# Patient Record
Sex: Female | Born: 1955 | Race: Black or African American | Hispanic: No | Marital: Single | Smoking: Never smoker
Health system: Southern US, Community
[De-identification: ages and names within clinical notes are randomized; demographics above are authoritative.]

## PROBLEM LIST (undated history)

## (undated) DIAGNOSIS — F329 Major depressive disorder, single episode, unspecified: Secondary | ICD-10-CM

## (undated) DIAGNOSIS — R7303 Prediabetes: Secondary | ICD-10-CM

## (undated) DIAGNOSIS — F259 Schizoaffective disorder, unspecified: Secondary | ICD-10-CM

## (undated) DIAGNOSIS — F32A Depression, unspecified: Secondary | ICD-10-CM

## (undated) DIAGNOSIS — I10 Essential (primary) hypertension: Secondary | ICD-10-CM

## (undated) DIAGNOSIS — E119 Type 2 diabetes mellitus without complications: Secondary | ICD-10-CM

## (undated) DIAGNOSIS — M419 Scoliosis, unspecified: Secondary | ICD-10-CM

## (undated) DIAGNOSIS — E7800 Pure hypercholesterolemia, unspecified: Secondary | ICD-10-CM

## (undated) HISTORY — PX: ABDOMINAL HYSTERECTOMY: SHX81

## (undated) NOTE — *Deleted (*Deleted)
   Name: Thuy Atilano MRN: [id number] DOB: 1955/12/21 29 y.o. PCP: System, Provider Not In  Date of Admission: 02/27/2020  3:43 PM Date of Discharge:  Attending Physician: Miguel Aschoff, MD  Discharge Diagnosis: 1. ***  Discharge Medications: Allergies as of 03/02/2020      Reactions   Paliperidone    Other reaction(s): Hives / Skin Rash   Quetiapine Fumarate Rash   Other reaction(s): Hives / Skin Rash   Risperidone Rash   Other reaction(s): Hives / Skin Rash   Diphenhydramine Hcl    Other reaction(s): Difficulty breathing, Hives / Skin Rash   Latex Rash   Other reaction(s): Rash, Rash Other reaction(s): DERMATITIS Other reaction(s): DERMATITIS    Med Rec must be completed prior to using this [facility]***       Disposition and follow-up:   Ms.Adley Ballester was discharged from [facility] in {DISCHARGE CONDITION:19696} condition.  At the hospital follow up visit please address:  1.  ***  2.  Labs / imaging needed at time of follow-up: ***  3.  Pending labs/ test needing follow-up: ***  Follow-up Appointments:   Hospital Course by problem list: 1. ***  Discharge Vitals:   BP 125/69 (BP Location: Right Arm)   Pulse (!) 55   Temp 98 F (36.7 C) (Oral)   Resp 18   Wt 68.3 kg   SpO2 100%   BMI 27.54 kg/m   Pertinent Labs, Studies, and Procedures:  ***  Discharge Instructions:   SignedEliezer Bottom, MD 03/02/2020, 3:29 PM   Pager: @MYPAGER @

---

## 2012-07-31 ENCOUNTER — Emergency Department (HOSPITAL_BASED_OUTPATIENT_CLINIC_OR_DEPARTMENT_OTHER)
Admission: EM | Admit: 2012-07-31 | Discharge: 2012-07-31 | Disposition: A | Discharge status: Home / Self Care | Payer: Medicare Other | Attending: Emergency Medicine | Admitting: Emergency Medicine

## 2012-07-31 ENCOUNTER — Emergency Department (HOSPITAL_BASED_OUTPATIENT_CLINIC_OR_DEPARTMENT_OTHER): Payer: Medicare Other

## 2012-07-31 ENCOUNTER — Encounter (HOSPITAL_BASED_OUTPATIENT_CLINIC_OR_DEPARTMENT_OTHER): Payer: Self-pay | Admitting: *Deleted

## 2012-07-31 DIAGNOSIS — M25569 Pain in unspecified knee: Secondary | ICD-10-CM | POA: Insufficient documentation

## 2012-07-31 DIAGNOSIS — I10 Essential (primary) hypertension: Secondary | ICD-10-CM | POA: Insufficient documentation

## 2012-07-31 DIAGNOSIS — Z862 Personal history of diseases of the blood and blood-forming organs and certain disorders involving the immune mechanism: Secondary | ICD-10-CM | POA: Insufficient documentation

## 2012-07-31 DIAGNOSIS — Z79899 Other long term (current) drug therapy: Secondary | ICD-10-CM | POA: Insufficient documentation

## 2012-07-31 DIAGNOSIS — F3289 Other specified depressive episodes: Secondary | ICD-10-CM | POA: Insufficient documentation

## 2012-07-31 DIAGNOSIS — F329 Major depressive disorder, single episode, unspecified: Secondary | ICD-10-CM | POA: Insufficient documentation

## 2012-07-31 DIAGNOSIS — M5432 Sciatica, left side: Secondary | ICD-10-CM

## 2012-07-31 DIAGNOSIS — M545 Low back pain, unspecified: Secondary | ICD-10-CM | POA: Insufficient documentation

## 2012-07-31 DIAGNOSIS — M5430 Sciatica, unspecified side: Secondary | ICD-10-CM | POA: Insufficient documentation

## 2012-07-31 DIAGNOSIS — Z8639 Personal history of other endocrine, nutritional and metabolic disease: Secondary | ICD-10-CM | POA: Insufficient documentation

## 2012-07-31 HISTORY — DX: Major depressive disorder, single episode, unspecified: F32.9

## 2012-07-31 HISTORY — DX: Pure hypercholesterolemia, unspecified: E78.00

## 2012-07-31 HISTORY — DX: Depression, unspecified: F32.A

## 2012-07-31 HISTORY — DX: Essential (primary) hypertension: I10

## 2012-07-31 MED ORDER — CYCLOBENZAPRINE HCL 10 MG PO TABS: 10.0000 mg | ORAL_TABLET | Freq: Two times a day (BID) | ORAL | Status: DC | PRN

## 2012-07-31 MED ORDER — HYDROCODONE-ACETAMINOPHEN 5-325 MG PO TABS: 1.0000 | ORAL_TABLET | Freq: Once | ORAL | Status: DC

## 2012-07-31 MED ORDER — HYDROCODONE-ACETAMINOPHEN 5-325 MG PO TABS: 1.0000 | ORAL_TABLET | Freq: Four times a day (QID) | ORAL | Status: DC | PRN

## 2012-07-31 NOTE — ED Notes (Signed)
Patient c/o L hip/knee pain over that past two months that has grown worse, took advil, temperary relief

## 2012-07-31 NOTE — ED Provider Notes (Signed)
History     CSN: [id number]  Arrival date & time 07/31/12  7829   First MD Initiated Contact with Patient 07/31/12 [phone]      Chief Complaint  Patient presents with  . Hip Pain  . Knee Pain    (Consider location/radiation/quality/duration/timing/severity/associated sxs/prior treatment) Patient is a 57 y.o. female presenting with hip pain and knee pain. The history is provided by the patient.  Hip Pain Pertinent negatives include no chest pain, no headaches and no shortness of breath.  Knee Pain Associated symptoms: back pain   Associated symptoms: no fever and no neck pain    patient with complaint of the left hip pain radiating to the knee anterior lateral part of 5. Ongoing for the past 2 months has gotten worse lately. Denies any numbness or weakness to the left foot no incontinence problems. Patient has had trouble with her lower back in the past and also has left back pain. Patient has a followup appointment with Dr. Christell Constant of orthopedics in [facility] on May 1. Patient treating herself with NSAIDs at home. There has been no injury to her back or leg. Pain is 10 out of 10 radiating as noted above was as sharp and an ache. Pain is worse at night. Not made worse by anything.  Past Medical History  Diagnosis Date  . Hypertension   . High cholesterol   . Depression     Past Surgical History  Procedure Laterality Date  . Abdominal hysterectomy      No family history on file.  History  Substance Use Topics  . Smoking status: Never Smoker   . Smokeless tobacco: Not on file  . Alcohol Use: No    OB History   Grav Para Term Preterm Abortions TAB SAB Ect Mult Living                  Review of Systems  Constitutional: Negative for fever.  HENT: Negative for neck pain.   Eyes: Negative for visual disturbance.  Respiratory: Negative for shortness of breath.   Cardiovascular: Negative for chest pain.  Genitourinary: Negative for dysuria.  Musculoskeletal: Positive for  back pain.  Skin: Negative for rash.  Neurological: Negative for weakness, numbness and headaches.  Hematological: Does not bruise/bleed easily.  Psychiatric/Behavioral: Negative for confusion.    Allergies  Review of patient's allergies indicates not on file.  Home Medications   Current Outpatient Rx  Name  Route  Sig  Dispense  Refill  . amoxicillin (AMOXIL) 500 MG capsule   Oral   Take 500 mg by mouth 3 (three) times daily.         . ARIPiprazole (ABILIFY) 15 MG tablet   Oral   Take 15 mg by mouth daily.         Marland Kitchen estradiol (ESTRACE) 1 MG tablet   Oral   Take 1 mg by mouth daily.         Marland Kitchen FLUoxetine (PROZAC) 20 MG tablet   Oral   Take 50 mg by mouth daily.         . trazodone (DESYREL) 300 MG tablet   Oral   Take 300 mg by mouth at bedtime.         . triamterene-hydrochlorothiazide (DYAZIDE) 37.5-25 MG per capsule   Oral   Take 1 capsule by mouth every morning.         . cyclobenzaprine (FLEXERIL) 10 MG tablet   Oral   Take 1 tablet (10 mg  total) by mouth 2 (two) times daily as needed for muscle spasms.   20 tablet   0   . HYDROcodone-acetaminophen (NORCO/VICODIN) 5-325 MG per tablet   Oral   Take 1-2 tablets by mouth every 6 (six) hours as needed for pain.   20 tablet   0     BP 168/91  Pulse 91  Temp(Src) 98.1 F (36.7 C)  Resp 18  SpO2 98%  Physical Exam  Nursing note and vitals reviewed. Constitutional: She is oriented to person, place, and time. She appears well-developed and well-nourished.  HENT:  Head: Normocephalic and atraumatic.  Eyes: Conjunctivae and EOM are normal. Pupils are equal, round, and reactive to light.  Neck: Normal range of motion. Neck supple.  Cardiovascular: Normal rate and regular rhythm.   Pulmonary/Chest: Effort normal and breath sounds normal.  Abdominal: Soft. Bowel sounds are normal. There is no tenderness.  Musculoskeletal: Normal range of motion. She exhibits tenderness. She exhibits no edema.   Mild tenderness lower lumbar area.  Neurological: She is alert and oriented to person, place, and time. No cranial nerve deficit. She exhibits normal muscle tone. Coordination normal.  Skin: Skin is warm. No rash noted.    ED Course  Procedures (including critical care time)  Labs Reviewed - No data to display Dg Hip Bilateral W/pelvis  07/31/2012  *RADIOLOGY REPORT*  Clinical Data: Bilateral hip pain.  BILATERAL HIP WITH PELVIS - 4+ VIEW  Comparison: None  Findings: There is no evidence of fracture, subluxation or dislocation. The joint spaces are unremarkable. No focal bony lesions are identified. No significant abnormalities are identified.  IMPRESSION: Unremarkable exam.   Original Report Authenticated By: Harmon Pier, M.D.    Ct Lumbar Spine Wo Contrast  07/31/2012  *RADIOLOGY REPORT*  Clinical Data: Back pain with left hip pain  CT LUMBAR SPINE WITHOUT CONTRAST  Technique:  Multidetector CT imaging of the lumbar spine was performed without intravenous contrast administration. Multiplanar CT image reconstructions were also generated.  Comparison: None.  Findings: Moderate levoscoliosis at L3-4.  Normal sagittal alignment.  Negative for fracture or mass lesion.  L1-2:  Mild facet degeneration.  Negative for disc protrusion or stenosis  L2-3:  Mild facet degeneration on the left.  Negative for disc protrusion  L3-4:  Mild facet degeneration without disc protrusion  L4-5:  Advanced facet degeneration bilaterally.  Mild disc bulging. Mild spinal stenosis.  Negative for focal disc protrusion.  L5-S1:  Mild disc degeneration and facet degeneration without stenosis.  IMPRESSION: Lumbar scoliosis.  Negative for fracture.  Lumbar facet degeneration, most severe at L4-5 where there is mild spinal stenosis.  No acute disc protrusion is identified.   Original Report Authenticated By: Janeece Riggers, M.D.      1. Lumbar pain   2. Sciatica neuralgia, left       MDM  The patient's symptoms are most  likely lumbar back related systems what sciatica type complaint of recent to the left side and based on the CT of the lumbar area a lot of facet degeneration in that area may be causing the symptoms no focal neuro deficit to her feet on the left side. No incontinence. Patient retreated with pain medicine does have an appointment with orthopedics Dr. Christell Constant in [facility] on May 1. Patient to followup with them.        Shelda Jakes, MD 07/31/12 [phone]

## 2012-12-29 ENCOUNTER — Encounter (HOSPITAL_BASED_OUTPATIENT_CLINIC_OR_DEPARTMENT_OTHER): Payer: Self-pay

## 2012-12-29 ENCOUNTER — Emergency Department (HOSPITAL_BASED_OUTPATIENT_CLINIC_OR_DEPARTMENT_OTHER): Payer: Medicare Other

## 2012-12-29 ENCOUNTER — Emergency Department (HOSPITAL_BASED_OUTPATIENT_CLINIC_OR_DEPARTMENT_OTHER)
Admission: EM | Admit: 2012-12-29 | Discharge: 2012-12-29 | Disposition: A | Discharge status: Home / Self Care | Payer: Medicare Other | Attending: Emergency Medicine | Admitting: Emergency Medicine

## 2012-12-29 DIAGNOSIS — R296 Repeated falls: Secondary | ICD-10-CM | POA: Insufficient documentation

## 2012-12-29 DIAGNOSIS — Y92009 Unspecified place in unspecified non-institutional (private) residence as the place of occurrence of the external cause: Secondary | ICD-10-CM | POA: Insufficient documentation

## 2012-12-29 DIAGNOSIS — F329 Major depressive disorder, single episode, unspecified: Secondary | ICD-10-CM | POA: Insufficient documentation

## 2012-12-29 DIAGNOSIS — I10 Essential (primary) hypertension: Secondary | ICD-10-CM | POA: Insufficient documentation

## 2012-12-29 DIAGNOSIS — Y939 Activity, unspecified: Secondary | ICD-10-CM | POA: Insufficient documentation

## 2012-12-29 DIAGNOSIS — G8929 Other chronic pain: Secondary | ICD-10-CM | POA: Insufficient documentation

## 2012-12-29 DIAGNOSIS — Z8639 Personal history of other endocrine, nutritional and metabolic disease: Secondary | ICD-10-CM | POA: Insufficient documentation

## 2012-12-29 DIAGNOSIS — M25562 Pain in left knee: Secondary | ICD-10-CM

## 2012-12-29 DIAGNOSIS — Z862 Personal history of diseases of the blood and blood-forming organs and certain disorders involving the immune mechanism: Secondary | ICD-10-CM | POA: Insufficient documentation

## 2012-12-29 DIAGNOSIS — M25552 Pain in left hip: Secondary | ICD-10-CM

## 2012-12-29 DIAGNOSIS — W19XXXA Unspecified fall, initial encounter: Secondary | ICD-10-CM

## 2012-12-29 DIAGNOSIS — F3289 Other specified depressive episodes: Secondary | ICD-10-CM | POA: Insufficient documentation

## 2012-12-29 DIAGNOSIS — S79919A Unspecified injury of unspecified hip, initial encounter: Secondary | ICD-10-CM | POA: Insufficient documentation

## 2012-12-29 DIAGNOSIS — Z8739 Personal history of other diseases of the musculoskeletal system and connective tissue: Secondary | ICD-10-CM | POA: Insufficient documentation

## 2012-12-29 DIAGNOSIS — S8990XA Unspecified injury of unspecified lower leg, initial encounter: Secondary | ICD-10-CM | POA: Insufficient documentation

## 2012-12-29 DIAGNOSIS — F259 Schizoaffective disorder, unspecified: Secondary | ICD-10-CM | POA: Insufficient documentation

## 2012-12-29 DIAGNOSIS — Z79899 Other long term (current) drug therapy: Secondary | ICD-10-CM | POA: Insufficient documentation

## 2012-12-29 HISTORY — DX: Scoliosis, unspecified: M41.9

## 2012-12-29 HISTORY — DX: Prediabetes: R73.03

## 2012-12-29 HISTORY — DX: Schizoaffective disorder, unspecified: F25.9

## 2012-12-29 MED ORDER — TRAMADOL HCL 50 MG PO TABS: 50.0000 mg | ORAL_TABLET | Freq: Three times a day (TID) | ORAL | Status: DC | PRN

## 2012-12-29 NOTE — ED Notes (Addendum)
Pt reports she fell last night and now has increased left hip pain radiating to left knee.  She has chronic back  pain and seen by neuro.  Pt also reports she has been "dropping things" x 2 days.

## 2012-12-29 NOTE — ED Provider Notes (Signed)
CSN: [id number]     Arrival date & time 12/29/12  9562 History   First MD Initiated Contact with Patient 12/29/12 0901     Chief Complaint  Patient presents with  . Hip Pain  . Knee Pain   (Consider location/radiation/quality/duration/timing/severity/associated sxs/prior Treatment) HPI Comments: Fall last night at home. L leg gave out, which is a chronic problem. No preceding symptoms. No N/V/dizziness. Patient here with L knee and hip pain.   Patient is a 57 y.o. female presenting with hip pain and knee pain. The history is provided by the patient.  Hip Pain This is a recurrent problem. The current episode started 6 to 12 hours ago. The problem occurs constantly. The problem has been gradually worsening. Pertinent negatives include no chest pain, no abdominal pain, no headaches and no shortness of breath. Nothing aggravates the symptoms. Nothing relieves the symptoms.  Knee Pain Associated symptoms: no fever     Past Medical History  Diagnosis Date  . Hypertension   . High cholesterol   . Depression   . Borderline diabetes   . Schizoaffective disorder   . Scoliosis    Past Surgical History  Procedure Laterality Date  . Abdominal hysterectomy     No family history on file. History  Substance Use Topics  . Smoking status: Never Smoker   . Smokeless tobacco: Not on file  . Alcohol Use: No   OB History   Grav Para Term Preterm Abortions TAB SAB Ect Mult Living                 Review of Systems  Constitutional: Negative for fever.  Respiratory: Negative for cough and shortness of breath.   Cardiovascular: Negative for chest pain.  Gastrointestinal: Negative for abdominal pain.  Neurological: Negative for headaches.  All other systems reviewed and are negative.    Allergies  Review of patient's allergies indicates no known allergies.  Home Medications   Current Outpatient Rx  Name  Route  Sig  Dispense  Refill  . ARIPiprazole (ABILIFY) 15 MG tablet   Oral  Take 15 mg by mouth daily.         Marland Kitchen estradiol (ESTRACE) 1 MG tablet   Oral   Take 1 mg by mouth daily.         Marland Kitchen FLUoxetine (PROZAC) 20 MG tablet   Oral   Take 50 mg by mouth daily.         . trazodone (DESYREL) 300 MG tablet   Oral   Take 300 mg by mouth at bedtime.         . triamterene-hydrochlorothiazide (DYAZIDE) 37.5-25 MG per capsule   Oral   Take 1 capsule by mouth every morning.          BP 116/110  Pulse 108  Temp(Src) 98.9 F (37.2 C) (Oral)  Resp 16  Wt 150 lb (68.04 kg)  SpO2 97% Physical Exam  Nursing note and vitals reviewed. Constitutional: She is oriented to person, place, and time. She appears well-developed and well-nourished. No distress.  HENT:  Head: Normocephalic and atraumatic.  Eyes: EOM are normal. Pupils are equal, round, and reactive to light.  Neck: Normal range of motion. Neck supple.  Cardiovascular: Normal rate and regular rhythm.  Exam reveals no friction rub.   No murmur heard. Pulmonary/Chest: Effort normal and breath sounds normal. No respiratory distress. She has no wheezes. She has no rales.  Abdominal: Soft. She exhibits no distension. There is no tenderness. There is  no rebound.  Musculoskeletal: Normal range of motion. She exhibits no edema.       Left hip: She exhibits tenderness (buttock). She exhibits normal range of motion, normal strength and no bony tenderness.       Left knee: She exhibits normal range of motion. Tenderness (patella) found.  Neurological: She is alert and oriented to person, place, and time.  Skin: She is not diaphoretic.    ED Course  Procedures (including critical care time) Labs Review Labs Reviewed - No data to display Imaging Review Dg Hip Complete Left  12/29/2012   CLINICAL DATA:  Left hip pain posteriorly radiating to left knee, fell last night  EXAM: LEFT HIP - COMPLETE 2+ VIEW  COMPARISON:  07/31/2012  FINDINGS: Symmetric hip and SI joints.  Osseous mineralization normal.  Pelvis  intact.  No acute fracture, dislocation, or bone destruction.  IMPRESSION: No acute osseous abnormalities.   Electronically Signed   By: Ulyses Southward M.D.   On: 12/29/2012 10:26   Dg Knee Complete 4 Views Left  12/29/2012   CLINICAL DATA:  Left hip pain posteriorly radiating to left knee laterally, fell last night  EXAM: LEFT KNEE - COMPLETE 4+ VIEW  COMPARISON:  None  FINDINGS: Bone mineralization normal. Joint spaces preserved. No fracture, dislocation, or bone destruction. No joint effusion.  IMPRESSION: Normal exam.   Electronically Signed   By: Ulyses Southward M.D.   On: 12/29/2012 10:28    MDM   1. Fall, initial encounter   2. Knee pain, acute, left   3. Hip pain, acute, left    57 year old female with history of scoliosis and chronic back pain presents with left knee pain and left buttock pain after a fall. She said her leg gave out while at home last night. No other preceding symptoms like chest pain or shortness of breath. She states this problem has been having a lot over the past 4-5 months. She is followed by her doctor for her back pain and takes tramadol. She reports left knee pain and left buttock pain today. On exam her vitals are stable. L buttock pain with very mild bruising. No L hip pain. L knee pain at patella, no bruising. Full ROM of knee and hip. Will xray knee and hip.  Xrays normal. Stable for discharge. Given tramadol.  Dagmar Hait, MD 12/29/12 [phone]

## 2012-12-29 NOTE — ED Notes (Signed)
Patient transported to X-ray 

## 2015-11-15 ENCOUNTER — Encounter (HOSPITAL_BASED_OUTPATIENT_CLINIC_OR_DEPARTMENT_OTHER): Payer: Self-pay | Admitting: *Deleted

## 2015-11-15 ENCOUNTER — Emergency Department (HOSPITAL_BASED_OUTPATIENT_CLINIC_OR_DEPARTMENT_OTHER)
Admission: EM | Admit: 2015-11-15 | Discharge: 2015-11-15 | Disposition: A | Discharge status: Home / Self Care | Payer: Medicare Other | Attending: Emergency Medicine | Admitting: Emergency Medicine

## 2015-11-15 DIAGNOSIS — Z79899 Other long term (current) drug therapy: Secondary | ICD-10-CM | POA: Insufficient documentation

## 2015-11-15 DIAGNOSIS — M722 Plantar fascial fibromatosis: Secondary | ICD-10-CM | POA: Insufficient documentation

## 2015-11-15 DIAGNOSIS — F329 Major depressive disorder, single episode, unspecified: Secondary | ICD-10-CM | POA: Insufficient documentation

## 2015-11-15 DIAGNOSIS — M25572 Pain in left ankle and joints of left foot: Secondary | ICD-10-CM | POA: Diagnosis present

## 2015-11-15 DIAGNOSIS — Z7984 Long term (current) use of oral hypoglycemic drugs: Secondary | ICD-10-CM | POA: Diagnosis not present

## 2015-11-15 DIAGNOSIS — E119 Type 2 diabetes mellitus without complications: Secondary | ICD-10-CM | POA: Insufficient documentation

## 2015-11-15 DIAGNOSIS — I10 Essential (primary) hypertension: Secondary | ICD-10-CM | POA: Diagnosis not present

## 2015-11-15 HISTORY — DX: Type 2 diabetes mellitus without complications: E11.9

## 2015-11-15 NOTE — ED Triage Notes (Signed)
Pain in her feet. States she is diabetic and has a hx of neuropathy.

## 2015-11-15 NOTE — Discharge Instructions (Signed)
Please read and follow all provided instructions.  Your diagnoses today include:  1. Plantar fasciitis    Tests performed today include: Vital signs. See below for your results today.   Medications prescribed:  Take as prescribed   Home care instructions:  Follow any educational materials contained in this packet.  Follow-up instructions: Please follow-up with Orthopedics for further evaluation of symptoms and treatment   Return instructions:  Please return to the Emergency Department if you do not get better, if you get worse, or new symptoms OR  - Fever (temperature greater than 101.81F)  - Bleeding that does not stop with holding pressure to the area    -Severe pain (please note that you may be more sore the day after your accident)  - Chest Pain  - Difficulty breathing  - Severe nausea or vomiting  - Inability to tolerate food and liquids  - Passing out  - Skin becoming red around your wounds  - Change in mental status (confusion or lethargy)  - New numbness or weakness    Please return if you have any other emergent concerns.  Additional Information:  Your vital signs today were: There were no vitals taken for this visit. If your blood pressure (BP) was elevated above 135/85 this visit, please have this repeated by your doctor within one month. ---------------

## 2015-11-15 NOTE — ED Provider Notes (Signed)
[facility] Provider Note   CSN: [id number] Arrival date & time: 11/15/15  1404  By signing my name below, I, Alyssa Grove, attest that this documentation has been prepared under the direction and in the presence of Audry Pili, PA-C. Electronically Signed: Alyssa Grove, ED Scribe. 11/15/15. 3:59 PM.  First MD Initiated Contact with Patient 11/15/15 1558    History   Chief Complaint Chief Complaint  Patient presents with  . Foot Pain    The history is provided by the patient and a friend. No language interpreter was used.    HPI Comments: Jessica Campbell is a 60 y.o. female with PMHx of DM who presents to the Emergency Department complaining of gradual onset and worsening, constant 8/10 bilateral foot pain onset 3 weeks. Pt states she has been having the sensation of pins and needles in the soles of her feet. Pt states pain is exacerbated with walking. Pt denies injury to feet. She states she wears special insoles which originally helped relieve pain, but now do not relieve pain. Pt has tried rolling her foot over a cold water bottle with no relief to pain. Pt states her blood sugar was measured at 129 today at home and are normally not under control. Pt uses Insulin at night and Metformin. Pt was advised to use a sliding scale Insulin by her PCP. Friend reports decreased appetite. No other symptoms noted   Past Medical History:  Diagnosis Date  . Borderline diabetes   . Depression   . Diabetes mellitus without complication (HCC)   . High cholesterol   . Hypertension   . Schizoaffective disorder (HCC)   . Scoliosis     There are no active problems to display for this patient.   Past Surgical History:  Procedure Laterality Date  . ABDOMINAL HYSTERECTOMY      OB History    No data available       Home Medications    Prior to Admission medications   Medication Sig Start Date End Date Taking? Authorizing Provider  atorvastatin (LIPITOR) 40 MG tablet Take 40  mg by mouth daily.   Yes Historical Provider, MD  ezetimibe (ZETIA) 10 MG tablet Take 10 mg by mouth daily.   Yes Historical Provider, MD  fluPHENAZine (PROLIXIN) 1 MG tablet Take 1 mg by mouth daily.   Yes Historical Provider, MD  gabapentin (NEURONTIN) 300 MG capsule Take 300 mg by mouth 3 (three) times daily.   Yes Historical Provider, MD  glipiZIDE (GLUCOTROL XL) 10 MG 24 hr tablet Take 10 mg by mouth daily with breakfast.   Yes Historical Provider, MD  glipizide-metformin (METAGLIP) 2.5-250 MG tablet Take 1 tablet by mouth 2 (two) times daily before a meal.   Yes Historical Provider, MD  metFORMIN (GLUCOPHAGE) 1000 MG tablet Take 1,000 mg by mouth 2 (two) times daily with a meal.   Yes Historical Provider, MD  sertraline (ZOLOFT) 50 MG tablet Take 50 mg by mouth daily.   Yes Historical Provider, MD  traMADol (ULTRAM) 50 MG tablet Take 1 tablet (50 mg total) by mouth every 8 (eight) hours as needed for pain. 12/29/12  Yes Elwin Mocha, MD  trazodone (DESYREL) 300 MG tablet Take 300 mg by mouth at bedtime.   Yes Historical Provider, MD  ARIPiprazole (ABILIFY) 15 MG tablet Take 15 mg by mouth daily.    Historical Provider, MD  estradiol (ESTRACE) 1 MG tablet Take 1 mg by mouth daily.    Historical Provider, MD  FLUoxetine (  PROZAC) 20 MG tablet Take 50 mg by mouth daily.    Historical Provider, MD  triamterene-hydrochlorothiazide (DYAZIDE) 37.5-25 MG per capsule Take 1 capsule by mouth every morning.    Historical Provider, MD    Family History No family history on file.  Social History Social History  Substance Use Topics  . Smoking status: Never Smoker  . Smokeless tobacco: Never Used  . Alcohol use No    Allergies   Review of patient's allergies indicates no known allergies.   Review of Systems Review of Systems  Constitutional: Negative for appetite change and fever.  Musculoskeletal: Positive for arthralgias.  All other systems reviewed and are negative.  Physical  Exam Updated Vital Signs There were no vitals taken for this visit.  Physical Exam  Constitutional: She appears well-developed and well-nourished.  HENT:  Head: Normocephalic.  Eyes: Conjunctivae are normal.  Cardiovascular: Normal rate.   Pulmonary/Chest: Effort normal. No respiratory distress.  Abdominal: She exhibits no distension.  Musculoskeletal: Normal range of motion.  Bilateral feet neurovascularly intact. Normal ROM. Distal pulses intact. TTP along proximal arch underneath feet. No erythema. No induration.   Neurological: She is alert.  Skin: Skin is warm and dry.  Psychiatric: She has a normal mood and affect. Her behavior is normal.  Nursing note and vitals reviewed.   ED Treatments / Results    COORDINATION OF CARE: 4:05 PM Discussed treatment plan with pt at bedside which includes referral to Orthopedist and pt agreed to plan.  Labs (all labs ordered are listed, but only abnormal results are displayed) Labs Reviewed - No data to display  EKG  EKG Interpretation None      Radiology No results found.  Procedures Procedures (including critical care time)  Medications Ordered in ED Medications - No data to display   Initial Impression / Assessment and Plan / ED Course  I have reviewed the triage vital signs and the nursing notes.  Pertinent labs & imaging results that were available during my care of the patient were reviewed by me and considered in my medical decision making (see chart for details).  Clinical Course   Final Clinical Impressions(s) / ED Diagnoses  I have reviewed the relevant previous healthcare records. I obtained HPI from historian.  ED Course:  Assessment: Pt is a 60yF with hx DM who presents with bilateral foot pain x 3 weeks. On exam, pt in NAD. Nontoxic/nonseptic appearing. VSS. Afebrile.Bilateral feet TTP below proximal arch of feet. No erythema no signs of infection. Neurovascularly intact. Likely plantar fascitis. Counseled  on stretches and follow up to orthopedics for further evaluation. Counseled to take NSAIDs for inflammation. Plan is to DC home with follow up to Ortho. At time of discharge, Patient is in no acute distress. Vital Signs are stable. Patient is able to ambulate. Patient able to tolerate PO.    Disposition/Plan:  DC Home Additional Verbal discharge instructions given and discussed with patient.  Pt Instructed to f/u with Ortho in the next week for evaluation and treatment of symptoms. Return precautions given Pt acknowledges and agrees with plan  Supervising Physician Nelva Nayobert Beaton, MD   Final diagnoses:  Plantar fasciitis    New Prescriptions New Prescriptions   No medications on file   I personally performed the services described in this documentation, which was scribed in my presence. The recorded information has been reviewed and is accurate.     Audry Piliyler Jovany Disano, PA-C 11/15/15 1622    Nelva Nayobert Beaton, MD 11/16/15 [phone]

## 2015-11-20 ENCOUNTER — Ambulatory Visit: Payer: Medicare Other | Admitting: Family Medicine

## 2018-02-09 ENCOUNTER — Other Ambulatory Visit: Payer: Self-pay

## 2018-02-09 ENCOUNTER — Encounter (HOSPITAL_BASED_OUTPATIENT_CLINIC_OR_DEPARTMENT_OTHER): Payer: Self-pay | Admitting: *Deleted

## 2018-02-09 ENCOUNTER — Emergency Department (HOSPITAL_BASED_OUTPATIENT_CLINIC_OR_DEPARTMENT_OTHER)
Admission: EM | Admit: 2018-02-09 | Discharge: 2018-02-09 | Disposition: A | Discharge status: Home / Self Care | Payer: Medicare HMO | Attending: Emergency Medicine | Admitting: Emergency Medicine

## 2018-02-09 DIAGNOSIS — I10 Essential (primary) hypertension: Secondary | ICD-10-CM | POA: Diagnosis not present

## 2018-02-09 DIAGNOSIS — E119 Type 2 diabetes mellitus without complications: Secondary | ICD-10-CM | POA: Diagnosis not present

## 2018-02-09 DIAGNOSIS — R51 Headache: Secondary | ICD-10-CM | POA: Insufficient documentation

## 2018-02-09 DIAGNOSIS — R519 Headache, unspecified: Secondary | ICD-10-CM

## 2018-02-09 DIAGNOSIS — Z7984 Long term (current) use of oral hypoglycemic drugs: Secondary | ICD-10-CM | POA: Diagnosis not present

## 2018-02-09 DIAGNOSIS — Z79899 Other long term (current) drug therapy: Secondary | ICD-10-CM | POA: Diagnosis not present

## 2018-02-09 MED ORDER — KETOROLAC TROMETHAMINE 15 MG/ML IJ SOLN
15.0000 mg | Freq: Once | INTRAMUSCULAR | Status: AC
  Administered 2018-02-09: 15 mg via INTRAVENOUS
  Filled 2018-02-09: qty 1

## 2018-02-09 MED ORDER — DIPHENHYDRAMINE HCL 50 MG/ML IJ SOLN
25.0000 mg | Freq: Once | INTRAMUSCULAR | Status: AC
  Administered 2018-02-09: 25 mg via INTRAVENOUS
  Filled 2018-02-09: qty 1

## 2018-02-09 MED ORDER — METOCLOPRAMIDE HCL 5 MG/ML IJ SOLN
10.0000 mg | INTRAMUSCULAR | Status: AC
  Administered 2018-02-09: 10 mg via INTRAVENOUS
  Filled 2018-02-09: qty 2

## 2018-02-09 MED ORDER — SODIUM CHLORIDE 0.9 % IV BOLUS
1000.0000 mL | Freq: Once | INTRAVENOUS | Status: AC
  Administered 2018-02-09: 1000 mL via INTRAVENOUS

## 2018-02-09 NOTE — ED Notes (Signed)
Blood glucose 247.

## 2018-02-09 NOTE — ED Notes (Signed)
ED Provider at bedside. 

## 2018-02-09 NOTE — ED Provider Notes (Signed)
[facility] EMERGENCY DEPARTMENT Provider Note   CSN: [id number] Arrival date & time: 02/09/18  1644     History   Chief Complaint Chief Complaint  Patient presents with  . Headache    HPI Jessica Campbell is a 62 y.o. female.  62 year old female with past medical history below including hypertension, hyperlipidemia, type 2 diabetes mellitus, depression, schizoaffective disorder who presents with headache.Yesterday she had a gradual onset of yesterday she had a gradual onset of dull, intermittent headache that has been persistent today.  Today she has had associated nausea and generalized weakness but no associated vomiting, diarrhea, fevers, vision changes, extremity weakness, or numbness.  She states that in the past she has had a similar headache that she had to be treated for in the ER.  No recent changes to her medications. No recent illness.  The history is provided by the patient.  Headache      Past Medical History:  Diagnosis Date  . Borderline diabetes   . Depression   . Diabetes mellitus without complication (HCC)   . High cholesterol   . Hypertension   . Schizoaffective disorder (HCC)   . Scoliosis     There are no active problems to display for this patient.   Past Surgical History:  Procedure Laterality Date  . ABDOMINAL HYSTERECTOMY       OB History   None      Home Medications    Prior to Admission medications   Medication Sig Start Date End Date Taking? Authorizing Provider  ARIPiprazole (ABILIFY) 15 MG tablet Take 15 mg by mouth daily.    [provider]  atorvastatin (LIPITOR) 40 MG tablet Take 40 mg by mouth daily.    [provider]  estradiol (ESTRACE) 1 MG tablet Take 1 mg by mouth daily.    [provider]  ezetimibe (ZETIA) 10 MG tablet Take 10 mg by mouth daily.    [provider]  FLUoxetine (PROZAC) 20 MG tablet Take 50 mg by mouth daily.    [provider]  fluPHENAZine  (PROLIXIN) 1 MG tablet Take 1 mg by mouth daily.    [provider]  gabapentin (NEURONTIN) 300 MG capsule Take 300 mg by mouth 3 (three) times daily.    [provider]  glipiZIDE (GLUCOTROL XL) 10 MG 24 hr tablet Take 10 mg by mouth daily with breakfast.    [provider]  glipizide-metformin (METAGLIP) 2.5-250 MG tablet Take 1 tablet by mouth 2 (two) times daily before a meal.    [provider]  metFORMIN (GLUCOPHAGE) 1000 MG tablet Take 1,000 mg by mouth 2 (two) times daily with a meal.    [provider]  sertraline (ZOLOFT) 50 MG tablet Take 50 mg by mouth daily.    [provider]  traMADol (ULTRAM) 50 MG tablet Take 1 tablet (50 mg total) by mouth every 8 (eight) hours as needed for pain. 12/29/12   Elwin Mocha, MD  trazodone (DESYREL) 300 MG tablet Take 300 mg by mouth at bedtime.    [provider]  triamterene-hydrochlorothiazide (DYAZIDE) 37.5-25 MG per capsule Take 1 capsule by mouth every morning.    [provider]    Family History No family history on file.  Social History Social History   Tobacco Use  . Smoking status: Never Smoker  . Smokeless tobacco: Never Used  Substance Use Topics  . Alcohol use: No  . Drug use: No     Allergies  Patient has no known allergies.   Review of Systems Review of Systems  Neurological: Positive for headaches.   All other systems reviewed and are negative except that which was mentioned in HPI   Physical Exam Updated Vital Signs BP 103/64   Pulse 70   Temp 97.7 F (36.5 C) (Oral)   Resp (!) 21   Ht 5\' 2"  (1.575 m)   SpO2 100%   BMI 27.44 kg/m   Physical Exam  Constitutional: She is oriented to person, place, and time. She appears well-developed and well-nourished. No distress.  Awake, alert  HENT:  Head: Normocephalic and atraumatic.  Eyes: Pupils are equal, round, and reactive to light. Conjunctivae and EOM are normal.  Neck: Neck  supple.  Cardiovascular: Normal rate, regular rhythm and normal heart sounds.  No murmur heard. Pulmonary/Chest: Effort normal and breath sounds normal. No respiratory distress.  Abdominal: Soft. Bowel sounds are normal. She exhibits no distension. There is no tenderness.  Musculoskeletal: She exhibits no edema.  Neurological: She is alert and oriented to person, place, and time. She has normal reflexes. No cranial nerve deficit. She exhibits normal muscle tone.  Fluent speech, normal finger-to-nose testing, negative pronator drift, no clonus 5/5 strength and normal sensation x all 4 extremities  Skin: Skin is warm and dry.  Psychiatric: Judgment and thought content normal.  Pleasant, bizarre affect  Nursing note and vitals reviewed.    ED Treatments / Results  Labs (all labs ordered are listed, but only abnormal results are displayed) Labs Reviewed  CBG MONITORING, ED    EKG None  Radiology No results found.  Procedures Procedures (including critical care time)  Medications Ordered in ED Medications  metoCLOPramide (REGLAN) injection 10 mg (10 mg Intravenous Given 02/09/18 1829)  diphenhydrAMINE (BENADRYL) injection 25 mg (25 mg Intravenous Given 02/09/18 1829)  ketorolac (TORADOL) 15 MG/ML injection 15 mg (15 mg Intravenous Given 02/09/18 1830)  sodium chloride 0.9 % bolus 1,000 mL (0 mLs Intravenous Stopped 02/09/18 1927)     Initial Impression / Assessment and Plan / ED Course  I have reviewed the triage vital signs and the nursing notes.  Pertinent labs that were available during my care of the patient were reviewed by me and considered in my medical decision making (see chart for details).    Well-appearing on exam, normal neurologic exam.  Reassuring vital signs.  It sounds like she has been to the ED for a similar headache previously.  Gave patient above migraine cocktail.  On reassessment, she stated that her symptoms had resolved including no headache and nausea.   She felt well and wanted to go home.  I have discussed supportive measures at home and extensively reviewed return precautions with her.  She voiced understanding. Final Clinical Impressions(s) / ED Diagnoses   Final diagnoses:  Bad headache    ED Discharge Orders    None       Little, Ambrose Finland, MD 02/09/18 2012

## 2018-02-09 NOTE — ED Notes (Signed)
Pt c/o nausea and headache today. States the medicine she takes make her head shake. And twitching in arms. Skin warm and dry. Pt states took insulin today but did not check blood sugar. States she ate chicken for lunch.

## 2018-02-09 NOTE — ED Triage Notes (Signed)
Headache and nausea since yesterday.

## 2018-02-10 LAB — CBG MONITORING, ED: Glucose-Capillary: 138 mg/dL — ABNORMAL HIGH (ref 70–99)

## 2019-11-16 ENCOUNTER — Other Ambulatory Visit: Payer: Self-pay | Admitting: Internal Medicine

## 2019-11-16 DIAGNOSIS — Z1231 Encounter for screening mammogram for malignant neoplasm of breast: Secondary | ICD-10-CM

## 2019-11-17 ENCOUNTER — Other Ambulatory Visit: Payer: Self-pay | Admitting: Internal Medicine

## 2019-11-17 DIAGNOSIS — E611 Iron deficiency: Secondary | ICD-10-CM

## 2019-12-15 ENCOUNTER — Other Ambulatory Visit: Payer: Self-pay

## 2019-12-15 ENCOUNTER — Ambulatory Visit
Admission: RE | Admit: 2019-12-15 | Discharge: 2019-12-15 | Disposition: A | Discharge status: Home / Self Care | Payer: Medicare HMO | Source: Ambulatory Visit | Attending: Internal Medicine | Admitting: Internal Medicine

## 2019-12-15 DIAGNOSIS — Z1231 Encounter for screening mammogram for malignant neoplasm of breast: Secondary | ICD-10-CM

## 2019-12-20 ENCOUNTER — Ambulatory Visit
Admission: RE | Admit: 2019-12-20 | Discharge: 2019-12-20 | Disposition: A | Discharge status: Home / Self Care | Payer: Medicare (Managed Care) | Source: Ambulatory Visit | Attending: Internal Medicine | Admitting: Internal Medicine

## 2019-12-20 ENCOUNTER — Other Ambulatory Visit: Payer: Self-pay

## 2019-12-20 DIAGNOSIS — Z1231 Encounter for screening mammogram for malignant neoplasm of breast: Secondary | ICD-10-CM

## 2020-02-21 ENCOUNTER — Other Ambulatory Visit: Payer: Medicare HMO

## 2020-02-22 ENCOUNTER — Other Ambulatory Visit: Payer: Self-pay | Admitting: Internal Medicine

## 2020-02-22 DIAGNOSIS — R270 Ataxia, unspecified: Secondary | ICD-10-CM

## 2020-02-27 ENCOUNTER — Inpatient Hospital Stay (HOSPITAL_COMMUNITY)
Admission: EM | Admit: 2020-02-27 | Discharge: 2020-04-16 | DRG: 885 | Disposition: A | Discharge status: Home Health | Payer: Medicare (Managed Care) | Attending: Student in an Organized Health Care Education/Training Program | Admitting: Student in an Organized Health Care Education/Training Program

## 2020-02-27 ENCOUNTER — Inpatient Hospital Stay: Admission: RE | Admit: 2020-02-27 | Payer: Medicare (Managed Care) | Source: Ambulatory Visit

## 2020-02-27 ENCOUNTER — Other Ambulatory Visit: Payer: Self-pay

## 2020-02-27 DIAGNOSIS — E860 Dehydration: Secondary | ICD-10-CM | POA: Diagnosis present

## 2020-02-27 DIAGNOSIS — Z7984 Long term (current) use of oral hypoglycemic drugs: Secondary | ICD-10-CM

## 2020-02-27 DIAGNOSIS — E785 Hyperlipidemia, unspecified: Secondary | ICD-10-CM | POA: Diagnosis present

## 2020-02-27 DIAGNOSIS — F200 Paranoid schizophrenia: Secondary | ICD-10-CM | POA: Diagnosis not present

## 2020-02-27 DIAGNOSIS — I10 Essential (primary) hypertension: Secondary | ICD-10-CM | POA: Diagnosis present

## 2020-02-27 DIAGNOSIS — E119 Type 2 diabetes mellitus without complications: Secondary | ICD-10-CM

## 2020-02-27 DIAGNOSIS — Z79899 Other long term (current) drug therapy: Secondary | ICD-10-CM

## 2020-02-27 DIAGNOSIS — Z781 Physical restraint status: Secondary | ICD-10-CM

## 2020-02-27 DIAGNOSIS — I129 Hypertensive chronic kidney disease with stage 1 through stage 4 chronic kidney disease, or unspecified chronic kidney disease: Secondary | ICD-10-CM | POA: Diagnosis present

## 2020-02-27 DIAGNOSIS — Z6832 Body mass index (BMI) 32.0-32.9, adult: Secondary | ICD-10-CM

## 2020-02-27 DIAGNOSIS — R410 Disorientation, unspecified: Secondary | ICD-10-CM

## 2020-02-27 DIAGNOSIS — N1831 Chronic kidney disease, stage 3a: Secondary | ICD-10-CM | POA: Diagnosis present

## 2020-02-27 DIAGNOSIS — F32A Depression, unspecified: Secondary | ICD-10-CM | POA: Diagnosis present

## 2020-02-27 DIAGNOSIS — R059 Cough, unspecified: Secondary | ICD-10-CM

## 2020-02-27 DIAGNOSIS — E7800 Pure hypercholesterolemia, unspecified: Secondary | ICD-10-CM | POA: Diagnosis present

## 2020-02-27 DIAGNOSIS — Z20822 Contact with and (suspected) exposure to covid-19: Secondary | ICD-10-CM | POA: Diagnosis present

## 2020-02-27 DIAGNOSIS — G2401 Drug induced subacute dyskinesia: Secondary | ICD-10-CM | POA: Diagnosis present

## 2020-02-27 DIAGNOSIS — W19XXXA Unspecified fall, initial encounter: Secondary | ICD-10-CM | POA: Diagnosis not present

## 2020-02-27 DIAGNOSIS — M419 Scoliosis, unspecified: Secondary | ICD-10-CM | POA: Diagnosis present

## 2020-02-27 DIAGNOSIS — Z818 Family history of other mental and behavioral disorders: Secondary | ICD-10-CM

## 2020-02-27 DIAGNOSIS — G259 Extrapyramidal and movement disorder, unspecified: Secondary | ICD-10-CM | POA: Diagnosis present

## 2020-02-27 DIAGNOSIS — R443 Hallucinations, unspecified: Secondary | ICD-10-CM | POA: Diagnosis present

## 2020-02-27 DIAGNOSIS — E440 Moderate protein-calorie malnutrition: Secondary | ICD-10-CM | POA: Insufficient documentation

## 2020-02-27 DIAGNOSIS — D649 Anemia, unspecified: Secondary | ICD-10-CM

## 2020-02-27 DIAGNOSIS — E861 Hypovolemia: Secondary | ICD-10-CM | POA: Diagnosis present

## 2020-02-27 DIAGNOSIS — Y92239 Unspecified place in hospital as the place of occurrence of the external cause: Secondary | ICD-10-CM | POA: Diagnosis present

## 2020-02-27 DIAGNOSIS — K5900 Constipation, unspecified: Secondary | ICD-10-CM | POA: Diagnosis present

## 2020-02-27 DIAGNOSIS — E1122 Type 2 diabetes mellitus with diabetic chronic kidney disease: Secondary | ICD-10-CM | POA: Diagnosis present

## 2020-02-27 DIAGNOSIS — N179 Acute kidney failure, unspecified: Secondary | ICD-10-CM | POA: Diagnosis present

## 2020-02-27 DIAGNOSIS — G3184 Mild cognitive impairment, so stated: Secondary | ICD-10-CM | POA: Diagnosis present

## 2020-02-27 LAB — CBC
HCT: 32.1 % — ABNORMAL LOW (ref 36.0–46.0)
Hemoglobin: 9.8 g/dL — ABNORMAL LOW (ref 12.0–15.0)
MCH: 27.3 pg (ref 26.0–34.0)
MCHC: 30.5 g/dL (ref 30.0–36.0)
MCV: 89.4 fL (ref 80.0–100.0)
Platelets: 276 10*3/uL (ref 150–400)
RBC: 3.59 MIL/uL — ABNORMAL LOW (ref 3.87–5.11)
RDW: 13.7 % (ref 11.5–15.5)
WBC: 6.8 10*3/uL (ref 4.0–10.5)
nRBC: 0 % (ref 0.0–0.2)

## 2020-02-27 LAB — COMPREHENSIVE METABOLIC PANEL
ALT: 26 U/L (ref 0–44)
AST: 22 U/L (ref 15–41)
Albumin: 4.2 g/dL (ref 3.5–5.0)
Alkaline Phosphatase: 56 U/L (ref 38–126)
Anion gap: 15 (ref 5–15)
BUN: 23 mg/dL (ref 8–23)
CO2: 21 mmol/L — ABNORMAL LOW (ref 22–32)
Calcium: 9.9 mg/dL (ref 8.9–10.3)
Chloride: 104 mmol/L (ref 98–111)
Creatinine, Ser: 2.31 mg/dL — ABNORMAL HIGH (ref 0.44–1.00)
GFR, Estimated: 23 mL/min — ABNORMAL LOW (ref >60)
Glucose, Bld: 116 mg/dL — ABNORMAL HIGH (ref 70–99)
Potassium: 3.8 mmol/L (ref 3.5–5.1)
Sodium: 140 mmol/L (ref 135–145)
Total Bilirubin: 0.8 mg/dL (ref 0.3–1.2)
Total Protein: 7.2 g/dL (ref 6.5–8.1)

## 2020-02-27 LAB — ETHANOL: Alcohol, Ethyl (B): <10 mg/dL (ref <10)

## 2020-02-27 LAB — CBG MONITORING, ED: Glucose-Capillary: 89 mg/dL (ref 70–99)

## 2020-02-27 MED ORDER — SODIUM CHLORIDE 0.9 % IV BOLUS
1000.0000 mL | Freq: Once | INTRAVENOUS | Status: AC
  Administered 2020-02-27: 1000 mL via INTRAVENOUS

## 2020-02-27 NOTE — ED Notes (Signed)
Pt is diabetic. CBG of 89. Prophylactic 4 oz orange juice given per RN Scarlet. Cracker and cheese also provided.

## 2020-02-27 NOTE — ED Provider Notes (Signed)
[facility] EMERGENCY DEPARTMENT Provider Note   CSN: [id number] Arrival date & time: 02/27/20  1516     History Chief Complaint  Patient presents with  . Hallucinations    Jessica Campbell is a 64 y.o. female.  Patient with past medical history notable for schizoaffective disorder, hypertension, diabetes, depression, presents to the emergency department with a chief complaint of hallucinations.  She is accompanied by her sister, with whom she lives with.  Sister reports that she has been seeing animals, bugs, and babies crawling around the floor while in the waiting room.  She states that her sister was hearing voices which awakened her from sleep last night at around 3 AM.  She reports that her sister has been awake since then.  She denies any alcohol or drug use.  Denies any medication changes.  She was admitted for similar back in June of this year.  She denies any fever, chills, cough, or shortness of breath.  Denies any other associated symptoms.  The history is provided by the patient and a relative. No language interpreter was used.  Sister [name] [phone]     Past Medical History:  Diagnosis Date  . Borderline diabetes   . Depression   . Diabetes mellitus without complication (HCC)   . High cholesterol   . Hypertension   . Schizoaffective disorder (HCC)   . Scoliosis     There are no problems to display for this patient.   Past Surgical History:  Procedure Laterality Date  . ABDOMINAL HYSTERECTOMY       OB History   No obstetric history on file.     No family history on file.  Social History   Tobacco Use  . Smoking status: Never Smoker  . Smokeless tobacco: Never Used  Substance Use Topics  . Alcohol use: No  . Drug use: No    Home Medications Prior to Admission medications   Medication Sig Start Date End Date Taking? Authorizing Provider  ARIPiprazole (ABILIFY) 15 MG tablet Take 15 mg by mouth daily.    [provider]  atorvastatin (LIPITOR) 40 MG tablet Take 40 mg by mouth daily.    [provider]  estradiol (ESTRACE) 1 MG tablet Take 1 mg by mouth daily.    [provider]  ezetimibe (ZETIA) 10 MG tablet Take 10 mg by mouth daily.    [provider]  FLUoxetine (PROZAC) 20 MG tablet Take 50 mg by mouth daily.    [provider]  fluPHENAZine (PROLIXIN) 1 MG tablet Take 1 mg by mouth daily.    [provider]  gabapentin (NEURONTIN) 300 MG capsule Take 300 mg by mouth 3 (three) times daily.    [provider]  glipiZIDE (GLUCOTROL XL) 10 MG 24 hr tablet Take 10 mg by mouth daily with breakfast.    [provider]  glipizide-metformin (METAGLIP) 2.5-250 MG tablet Take 1 tablet by mouth 2 (two) times daily before a meal.    [provider]  metFORMIN (GLUCOPHAGE) 1000 MG tablet Take 1,000 mg by mouth 2 (two) times daily with a meal.    [provider]  sertraline (ZOLOFT) 50 MG tablet Take 50 mg by mouth daily.    [provider]  traMADol (ULTRAM) 50 MG tablet Take 1 tablet (50 mg total) by mouth every 8 (eight) hours as needed for pain. 12/29/12   Elwin Mocha, MD  trazodone (DESYREL) 300 MG tablet Take 300 mg by mouth at  bedtime.    [provider]  triamterene-hydrochlorothiazide (DYAZIDE) 37.5-25 MG per capsule Take 1 capsule by mouth every morning.    [provider]    Allergies    Patient has no known allergies.  Review of Systems   Review of Systems  All other systems reviewed and are negative.   Physical Exam Updated Vital Signs BP (!) 118/40 (BP Location: Left Arm)   Pulse 77   Temp 97.7 F (36.5 C) (Oral)   Resp 20   SpO2 100%   Physical Exam Vitals and nursing note reviewed.  Constitutional:      General: She is not in acute distress.    Appearance: She is well-developed.  HENT:     Head: Normocephalic and atraumatic.     Mouth/Throat:     Mouth: Mucous  membranes are dry.  Eyes:     Conjunctiva/sclera: Conjunctivae normal.  Cardiovascular:     Rate and Rhythm: Normal rate and regular rhythm.     Heart sounds: No murmur heard.   Pulmonary:     Effort: Pulmonary effort is normal. No respiratory distress.     Breath sounds: Normal breath sounds.  Abdominal:     Palpations: Abdomen is soft.     Tenderness: There is no abdominal tenderness.  Musculoskeletal:        General: Normal range of motion.     Cervical back: Neck supple.  Skin:    General: Skin is warm and dry.  Neurological:     Mental Status: She is alert and oriented to person, place, and time.  Psychiatric:        Mood and Affect: Mood normal.     ED Results / Procedures / Treatments   Labs (all labs ordered are listed, but only abnormal results are displayed) Labs Reviewed  COMPREHENSIVE METABOLIC PANEL - Abnormal; Notable for the following components:      Result Value   CO2 21 (*)    Glucose, Bld 116 (*)    Creatinine, Ser 2.31 (*)    GFR, Estimated 23 (*)    All other components within normal limits  CBC - Abnormal; Notable for the following components:   RBC 3.59 (*)    Hemoglobin 9.8 (*)    HCT 32.1 (*)    All other components within normal limits  ETHANOL  RAPID URINE DRUG SCREEN, HOSP PERFORMED  CBG MONITORING, ED    EKG None  Radiology No results found.  Procedures Procedures (including critical care time)  Medications Ordered in ED Medications  sodium chloride 0.9 % bolus 1,000 mL (has no administration in time range)    ED Course  I have reviewed the triage vital signs and the nursing notes.  Pertinent labs & imaging results that were available during my care of the patient were reviewed by me and considered in my medical decision making (see chart for details).    MDM Rules/Calculators/A&P                          Patient here with worsening hallucinations.  She has not had any recent medication changes.  Sister is concerned  because she cannot focus due to the hallucinations.  She has also not been able to sleep today.  She has been admitted in the past in [place].  It is also noted today in her initial screening labs that her creatinine is 2.31.  Her baseline appears to be 1.3 in reviewing records  from earlier this year.  During her recent psychiatric admission in June, she was treated for AKI initially as her creatinine was 2.18.  This resolved with fluids and came down to about 1.3.  She does appear quite dry.  I will give her a liter of fluid now.  11:05 PM Case discussed with Dr. Madilyn Hook, who recommends admission for AKI, as the patient cannot be medically cleared for psychiatric evaluation in the setting of this acute kidney injury.  Sister [name] [phone]  11:46 PM Case discussed with Dr. Debby Bud, who is appreciated for admitting the patient for AKI.  Patient will also need psychiatric evaluation in the hospital.    Final Clinical Impression(s) / ED Diagnoses Final diagnoses:  AKI (acute kidney injury) [facility])  Hallucinations    Rx / DC Orders ED Discharge Orders    None       Roxy Horseman, PA-C 02/27/20 2346    Tilden Fossa, MD 02/28/20 1714

## 2020-02-27 NOTE — ED Triage Notes (Signed)
Patient accompanied by sister endorsed hallucinations and jitteriness the last few days, endorsed recent changes w/ schizophrenia medications. Patient stated she is asthmatic and she has shortness of breath, sister stated she is not, and she is here for hallucinations and seeing "bugs". Patient denies intention to hurt self or others, follows commands.

## 2020-02-28 ENCOUNTER — Observation Stay (HOSPITAL_COMMUNITY): Payer: Medicare (Managed Care)

## 2020-02-28 DIAGNOSIS — E785 Hyperlipidemia, unspecified: Secondary | ICD-10-CM | POA: Diagnosis present

## 2020-02-28 DIAGNOSIS — G2401 Drug induced subacute dyskinesia: Secondary | ICD-10-CM | POA: Diagnosis present

## 2020-02-28 DIAGNOSIS — Z20822 Contact with and (suspected) exposure to covid-19: Secondary | ICD-10-CM | POA: Diagnosis present

## 2020-02-28 DIAGNOSIS — F200 Paranoid schizophrenia: Secondary | ICD-10-CM | POA: Diagnosis present

## 2020-02-28 DIAGNOSIS — D649 Anemia, unspecified: Secondary | ICD-10-CM | POA: Diagnosis present

## 2020-02-28 DIAGNOSIS — F32A Depression, unspecified: Secondary | ICD-10-CM | POA: Diagnosis present

## 2020-02-28 DIAGNOSIS — R443 Hallucinations, unspecified: Secondary | ICD-10-CM | POA: Diagnosis present

## 2020-02-28 DIAGNOSIS — E1122 Type 2 diabetes mellitus with diabetic chronic kidney disease: Secondary | ICD-10-CM | POA: Diagnosis present

## 2020-02-28 DIAGNOSIS — G259 Extrapyramidal and movement disorder, unspecified: Secondary | ICD-10-CM | POA: Diagnosis present

## 2020-02-28 DIAGNOSIS — E440 Moderate protein-calorie malnutrition: Secondary | ICD-10-CM | POA: Diagnosis present

## 2020-02-28 DIAGNOSIS — E119 Type 2 diabetes mellitus without complications: Secondary | ICD-10-CM | POA: Diagnosis not present

## 2020-02-28 DIAGNOSIS — E860 Dehydration: Secondary | ICD-10-CM | POA: Diagnosis present

## 2020-02-28 DIAGNOSIS — G2570 Drug induced movement disorder, unspecified: Secondary | ICD-10-CM | POA: Diagnosis not present

## 2020-02-28 DIAGNOSIS — I129 Hypertensive chronic kidney disease with stage 1 through stage 4 chronic kidney disease, or unspecified chronic kidney disease: Secondary | ICD-10-CM | POA: Diagnosis present

## 2020-02-28 DIAGNOSIS — N179 Acute kidney failure, unspecified: Secondary | ICD-10-CM | POA: Diagnosis present

## 2020-02-28 DIAGNOSIS — N1831 Chronic kidney disease, stage 3a: Secondary | ICD-10-CM | POA: Diagnosis present

## 2020-02-28 DIAGNOSIS — I10 Essential (primary) hypertension: Secondary | ICD-10-CM | POA: Diagnosis present

## 2020-02-28 DIAGNOSIS — W19XXXA Unspecified fall, initial encounter: Secondary | ICD-10-CM | POA: Diagnosis not present

## 2020-02-28 DIAGNOSIS — E861 Hypovolemia: Secondary | ICD-10-CM | POA: Diagnosis present

## 2020-02-28 DIAGNOSIS — Z7984 Long term (current) use of oral hypoglycemic drugs: Secondary | ICD-10-CM | POA: Diagnosis not present

## 2020-02-28 DIAGNOSIS — Z818 Family history of other mental and behavioral disorders: Secondary | ICD-10-CM | POA: Diagnosis not present

## 2020-02-28 DIAGNOSIS — K5900 Constipation, unspecified: Secondary | ICD-10-CM | POA: Diagnosis present

## 2020-02-28 DIAGNOSIS — T443X5A Adverse effect of other parasympatholytics [anticholinergics and antimuscarinics] and spasmolytics, initial encounter: Secondary | ICD-10-CM | POA: Diagnosis not present

## 2020-02-28 DIAGNOSIS — Z6832 Body mass index (BMI) 32.0-32.9, adult: Secondary | ICD-10-CM | POA: Diagnosis not present

## 2020-02-28 DIAGNOSIS — M419 Scoliosis, unspecified: Secondary | ICD-10-CM | POA: Diagnosis present

## 2020-02-28 DIAGNOSIS — Z79899 Other long term (current) drug therapy: Secondary | ICD-10-CM | POA: Diagnosis not present

## 2020-02-28 DIAGNOSIS — Y92239 Unspecified place in hospital as the place of occurrence of the external cause: Secondary | ICD-10-CM | POA: Diagnosis present

## 2020-02-28 DIAGNOSIS — G3184 Mild cognitive impairment, so stated: Secondary | ICD-10-CM | POA: Diagnosis present

## 2020-02-28 DIAGNOSIS — E7800 Pure hypercholesterolemia, unspecified: Secondary | ICD-10-CM | POA: Diagnosis present

## 2020-02-28 DIAGNOSIS — Z781 Physical restraint status: Secondary | ICD-10-CM | POA: Diagnosis not present

## 2020-02-28 DIAGNOSIS — D631 Anemia in chronic kidney disease: Secondary | ICD-10-CM | POA: Diagnosis not present

## 2020-02-28 LAB — URINALYSIS, ROUTINE W REFLEX MICROSCOPIC
Bilirubin Urine: NEGATIVE
Glucose, UA: NEGATIVE mg/dL
Hgb urine dipstick: NEGATIVE
Ketones, ur: NEGATIVE mg/dL
Nitrite: NEGATIVE
Protein, ur: NEGATIVE mg/dL
Specific Gravity, Urine: 1.01 (ref 1.005–1.030)
pH: 5 (ref 5.0–8.0)

## 2020-02-28 LAB — BASIC METABOLIC PANEL
Anion gap: 11 (ref 5–15)
Anion gap: 11 (ref 5–15)
BUN: 24 mg/dL — ABNORMAL HIGH (ref 8–23)
BUN: 21 mg/dL (ref 8–23)
CO2: 20 mmol/L — ABNORMAL LOW (ref 22–32)
CO2: 22 mmol/L (ref 22–32)
Calcium: 9.1 mg/dL (ref 8.9–10.3)
Calcium: 9.3 mg/dL (ref 8.9–10.3)
Chloride: 109 mmol/L (ref 98–111)
Chloride: 107 mmol/L (ref 98–111)
Creatinine, Ser: 2.48 mg/dL — ABNORMAL HIGH (ref 0.44–1.00)
Creatinine, Ser: 2.23 mg/dL — ABNORMAL HIGH (ref 0.44–1.00)
GFR, Estimated: 21 mL/min — ABNORMAL LOW (ref >60)
GFR, Estimated: 24 mL/min — ABNORMAL LOW (ref >60)
Glucose, Bld: 121 mg/dL — ABNORMAL HIGH (ref 70–99)
Glucose, Bld: 137 mg/dL — ABNORMAL HIGH (ref 70–99)
Potassium: 3.6 mmol/L (ref 3.5–5.1)
Potassium: 3.7 mmol/L (ref 3.5–5.1)
Sodium: 140 mmol/L (ref 135–145)
Sodium: 140 mmol/L (ref 135–145)

## 2020-02-28 LAB — GLUCOSE, CAPILLARY
Glucose-Capillary: 110 mg/dL — ABNORMAL HIGH (ref 70–99)
Glucose-Capillary: 141 mg/dL — ABNORMAL HIGH (ref 70–99)
Glucose-Capillary: 97 mg/dL (ref 70–99)
Glucose-Capillary: 133 mg/dL — ABNORMAL HIGH (ref 70–99)

## 2020-02-28 LAB — RAPID URINE DRUG SCREEN, HOSP PERFORMED
Amphetamines: NOT DETECTED
Barbiturates: NOT DETECTED
Benzodiazepines: NOT DETECTED
Cocaine: NOT DETECTED
Opiates: NOT DETECTED
Tetrahydrocannabinol: NOT DETECTED

## 2020-02-28 LAB — RESPIRATORY PANEL BY RT PCR (FLU A&B, COVID)
Influenza A by PCR: NEGATIVE
Influenza B by PCR: NEGATIVE
SARS Coronavirus 2 by RT PCR: NEGATIVE

## 2020-02-28 LAB — SODIUM, URINE, RANDOM: Sodium, Ur: 69 mmol/L

## 2020-02-28 LAB — HIV ANTIBODY (ROUTINE TESTING W REFLEX): HIV Screen 4th Generation wRfx: NONREACTIVE

## 2020-02-28 LAB — HEMOGLOBIN A1C
Hgb A1c MFr Bld: 6 % — ABNORMAL HIGH (ref 4.8–5.6)
Mean Plasma Glucose: 125.5 mg/dL

## 2020-02-28 MED ORDER — SENNA 8.6 MG PO TABS
1.0000 | ORAL_TABLET | Freq: Two times a day (BID) | ORAL | Status: DC
  Administered 2020-02-28 – 2020-03-08 (×18): 8.6 mg via ORAL
  Filled 2020-02-28 (×20): qty 1

## 2020-02-28 MED ORDER — FLUPHENAZINE HCL 1 MG PO TABS
1.0000 mg | ORAL_TABLET | Freq: Every day | ORAL | Status: DC
  Administered 2020-02-28: 1 mg via ORAL
  Filled 2020-02-28: qty 1

## 2020-02-28 MED ORDER — PANTOPRAZOLE SODIUM 40 MG PO TBEC
40.0000 mg | DELAYED_RELEASE_TABLET | Freq: Every day | ORAL | Status: DC
  Administered 2020-02-28 – 2020-04-16 (×48): 40 mg via ORAL
  Filled 2020-02-28 (×50): qty 1

## 2020-02-28 MED ORDER — GABAPENTIN 300 MG PO CAPS: 300.0000 mg | ORAL_CAPSULE | Freq: Three times a day (TID) | ORAL | Status: DC

## 2020-02-28 MED ORDER — ARIPIPRAZOLE 5 MG PO TABS
15.0000 mg | ORAL_TABLET | Freq: Every day | ORAL | Status: DC
  Administered 2020-02-28 – 2020-03-05 (×7): 15 mg via ORAL
  Filled 2020-02-28 (×7): qty 1

## 2020-02-28 MED ORDER — SERTRALINE HCL 50 MG PO TABS
50.0000 mg | ORAL_TABLET | Freq: Every day | ORAL | Status: DC
  Administered 2020-02-28 – 2020-03-14 (×16): 50 mg via ORAL
  Filled 2020-02-28 (×17): qty 1

## 2020-02-28 MED ORDER — TRAZODONE HCL 100 MG PO TABS
300.0000 mg | ORAL_TABLET | Freq: Every day | ORAL | Status: DC
  Administered 2020-02-28 – 2020-03-23 (×24): 300 mg via ORAL
  Filled 2020-02-28: qty 3
  Filled 2020-02-28: qty 6
  Filled 2020-02-28 (×3): qty 3
  Filled 2020-02-28 (×5): qty 6
  Filled 2020-02-28: qty 3
  Filled 2020-02-28: qty 6
  Filled 2020-02-28 (×2): qty 3
  Filled 2020-02-28 (×3): qty 6
  Filled 2020-02-28 (×2): qty 3
  Filled 2020-02-28 (×2): qty 6
  Filled 2020-02-28 (×5): qty 3

## 2020-02-28 MED ORDER — ASPIRIN EC 81 MG PO TBEC
81.0000 mg | DELAYED_RELEASE_TABLET | Freq: Every day | ORAL | Status: DC
  Administered 2020-02-28 – 2020-04-16 (×48): 81 mg via ORAL
  Filled 2020-02-28 (×49): qty 1

## 2020-02-28 MED ORDER — SODIUM CHLORIDE 0.45 % IV SOLN: INTRAVENOUS | Status: DC

## 2020-02-28 MED ORDER — TRAZODONE HCL 50 MG PO TABS
50.0000 mg | ORAL_TABLET | Freq: Once | ORAL | Status: AC
  Administered 2020-02-28: 50 mg via ORAL
  Filled 2020-02-28: qty 1

## 2020-02-28 MED ORDER — AMANTADINE HCL 100 MG PO CAPS
100.0000 mg | ORAL_CAPSULE | Freq: Two times a day (BID) | ORAL | Status: DC
  Administered 2020-02-28 – 2020-03-26 (×54): 100 mg via ORAL
  Filled 2020-02-28 (×55): qty 1

## 2020-02-28 MED ORDER — LACTATED RINGERS IV SOLN: INTRAVENOUS | Status: DC

## 2020-02-28 MED ORDER — HEPARIN SODIUM (PORCINE) 5000 UNIT/ML IJ SOLN
5000.0000 [IU] | Freq: Three times a day (TID) | INTRAMUSCULAR | Status: DC
  Administered 2020-02-28 – 2020-04-02 (×99): 5000 [IU] via SUBCUTANEOUS
  Filled 2020-02-28 (×89): qty 1

## 2020-02-28 MED ORDER — FLUOXETINE HCL 20 MG PO TABS: 50.0000 mg | ORAL_TABLET | Freq: Every day | ORAL | Status: DC

## 2020-02-28 MED ORDER — INSULIN ASPART 100 UNIT/ML ~~LOC~~ SOLN
0.0000 [IU] | Freq: Three times a day (TID) | SUBCUTANEOUS | Status: DC
  Administered 2020-02-28 – 2020-03-06 (×4): 2 [IU] via SUBCUTANEOUS
  Administered 2020-03-07 (×2): 3 [IU] via SUBCUTANEOUS
  Administered 2020-03-07 – 2020-03-08 (×2): 2 [IU] via SUBCUTANEOUS
  Administered 2020-03-08 – 2020-03-10 (×5): 3 [IU] via SUBCUTANEOUS
  Administered 2020-03-10: 2 [IU] via SUBCUTANEOUS
  Administered 2020-03-11 (×2): 3 [IU] via SUBCUTANEOUS
  Administered 2020-03-11 – 2020-03-12 (×2): 2 [IU] via SUBCUTANEOUS
  Administered 2020-03-12: 3 [IU] via SUBCUTANEOUS
  Administered 2020-03-12 – 2020-03-14 (×4): 2 [IU] via SUBCUTANEOUS
  Administered 2020-03-15: 5 [IU] via SUBCUTANEOUS
  Administered 2020-03-16 (×2): 2 [IU] via SUBCUTANEOUS
  Administered 2020-03-18: 3 [IU] via SUBCUTANEOUS
  Administered 2020-03-18 – 2020-03-19 (×3): 2 [IU] via SUBCUTANEOUS
  Administered 2020-03-20 (×2): 3 [IU] via SUBCUTANEOUS
  Administered 2020-03-20 – 2020-03-21 (×2): 2 [IU] via SUBCUTANEOUS
  Administered 2020-03-21: 3 [IU] via SUBCUTANEOUS
  Administered 2020-03-21 – 2020-03-22 (×2): 2 [IU] via SUBCUTANEOUS
  Administered 2020-03-22: 3 [IU] via SUBCUTANEOUS
  Administered 2020-03-22 – 2020-03-23 (×2): 5 [IU] via SUBCUTANEOUS
  Administered 2020-03-23 – 2020-03-24 (×3): 2 [IU] via SUBCUTANEOUS
  Administered 2020-03-24: 3 [IU] via SUBCUTANEOUS
  Administered 2020-03-25: 8 [IU] via SUBCUTANEOUS
  Administered 2020-03-25 – 2020-03-26 (×2): 2 [IU] via SUBCUTANEOUS
  Administered 2020-03-26: 5 [IU] via SUBCUTANEOUS
  Administered 2020-03-26 – 2020-03-27 (×2): 3 [IU] via SUBCUTANEOUS
  Administered 2020-03-27 (×2): 2 [IU] via SUBCUTANEOUS
  Administered 2020-03-28: 3 [IU] via SUBCUTANEOUS
  Administered 2020-03-28 – 2020-03-29 (×2): 2 [IU] via SUBCUTANEOUS
  Administered 2020-03-29: 5 [IU] via SUBCUTANEOUS
  Administered 2020-03-29 – 2020-03-30 (×2): 2 [IU] via SUBCUTANEOUS
  Administered 2020-03-30: 3 [IU] via SUBCUTANEOUS
  Administered 2020-03-31: 2 [IU] via SUBCUTANEOUS
  Administered 2020-03-31: 3 [IU] via SUBCUTANEOUS
  Administered 2020-04-01 (×2): 2 [IU] via SUBCUTANEOUS
  Administered 2020-04-02: 3 [IU] via SUBCUTANEOUS
  Administered 2020-04-03 – 2020-04-04 (×5): 2 [IU] via SUBCUTANEOUS
  Administered 2020-04-05: 3 [IU] via SUBCUTANEOUS
  Administered 2020-04-05 – 2020-04-08 (×5): 2 [IU] via SUBCUTANEOUS
  Administered 2020-04-08 – 2020-04-09 (×3): 3 [IU] via SUBCUTANEOUS
  Administered 2020-04-10: 2 [IU] via SUBCUTANEOUS
  Administered 2020-04-10 (×2): 3 [IU] via SUBCUTANEOUS
  Administered 2020-04-11: 2 [IU] via SUBCUTANEOUS
  Administered 2020-04-11: 5 [IU] via SUBCUTANEOUS
  Administered 2020-04-11 – 2020-04-12 (×2): 3 [IU] via SUBCUTANEOUS
  Administered 2020-04-12: 8 [IU] via SUBCUTANEOUS
  Administered 2020-04-12: 2 [IU] via SUBCUTANEOUS
  Administered 2020-04-13: 3 [IU] via SUBCUTANEOUS
  Administered 2020-04-13: 5 [IU] via SUBCUTANEOUS
  Administered 2020-04-14 (×2): 2 [IU] via SUBCUTANEOUS
  Administered 2020-04-14: 3 [IU] via SUBCUTANEOUS
  Administered 2020-04-15: 8 [IU] via SUBCUTANEOUS
  Administered 2020-04-15: 3 [IU] via SUBCUTANEOUS
  Administered 2020-04-15: 2 [IU] via SUBCUTANEOUS
  Administered 2020-04-16: 3 [IU] via SUBCUTANEOUS

## 2020-02-28 MED ORDER — BENZTROPINE MESYLATE 0.5 MG PO TABS: 0.5000 mg | ORAL_TABLET | ORAL | Status: DC

## 2020-02-28 MED ORDER — ESTRADIOL 1 MG PO TABS: 1.0000 mg | ORAL_TABLET | Freq: Every day | ORAL | Status: DC

## 2020-02-28 MED ORDER — ENOXAPARIN SODIUM 30 MG/0.3ML ~~LOC~~ SOLN: 30.0000 mg | Freq: Every day | SUBCUTANEOUS | Status: DC

## 2020-02-28 MED ORDER — ACETAMINOPHEN 650 MG RE SUPP: 650.0000 mg | Freq: Four times a day (QID) | RECTAL | Status: DC | PRN

## 2020-02-28 MED ORDER — HALOPERIDOL LACTATE 2 MG/ML PO CONC
2.0000 mg | Freq: Once | ORAL | Status: AC
  Administered 2020-02-28: 2 mg via ORAL
  Filled 2020-02-28: qty 1

## 2020-02-28 MED ORDER — ATORVASTATIN CALCIUM 40 MG PO TABS
40.0000 mg | ORAL_TABLET | Freq: Every day | ORAL | Status: DC
  Administered 2020-02-28 – 2020-04-16 (×48): 40 mg via ORAL
  Filled 2020-02-28 (×49): qty 1

## 2020-02-28 MED ORDER — BENZTROPINE MESYLATE 0.5 MG PO TABS
0.5000 mg | ORAL_TABLET | Freq: Every day | ORAL | Status: DC
  Administered 2020-02-28 – 2020-03-23 (×24): 0.5 mg via ORAL
  Filled 2020-02-28 (×26): qty 1

## 2020-02-28 MED ORDER — EZETIMIBE 10 MG PO TABS
10.0000 mg | ORAL_TABLET | Freq: Every day | ORAL | Status: DC
  Administered 2020-02-28: 10 mg via ORAL
  Filled 2020-02-28: qty 1

## 2020-02-28 MED ORDER — BENZTROPINE MESYLATE 1 MG PO TABS
1.0000 mg | ORAL_TABLET | Freq: Every day | ORAL | Status: DC
  Administered 2020-02-28 – 2020-03-24 (×26): 1 mg via ORAL
  Filled 2020-02-28 (×26): qty 1

## 2020-02-28 MED ORDER — ACETAMINOPHEN 325 MG PO TABS
650.0000 mg | ORAL_TABLET | Freq: Four times a day (QID) | ORAL | Status: DC | PRN
  Administered 2020-02-28 – 2020-04-15 (×4): 650 mg via ORAL
  Filled 2020-02-28 (×4): qty 2

## 2020-02-28 NOTE — Plan of Care (Signed)
Progressing

## 2020-02-28 NOTE — ED Notes (Signed)
Attempted to call report x 1  

## 2020-02-28 NOTE — ED Notes (Signed)
Pt is still anxious and hallucinating. She believes there is bugs crawling on her. This RN called pharmacy to see if morning medications could be verified to be given early per Norins, MD. Some medications are able to be verified and sent down, they will have to call family at a later time and confirm medications that are being taken at home.   Pt is cooperative at this time and sitting in the bed. Pt called sister and asked her to come up here. Pt has taken gown off and is fully dressed.

## 2020-02-28 NOTE — ED Notes (Signed)
Pt extremely anxious and hallucinating more. Pt pulled IV and is trying to leave. This RN paged Norins, MD. Orders to give Trazodone 50mg  P.O. and if that doesn't help to give psych meds early.

## 2020-02-28 NOTE — Progress Notes (Signed)
New Admission Note: ? Arrival Method: Stretcher  Mental Orientation: Alert and Oriented x 3 Telemetry: None Assessment: Completed Skin: Refer to flowsheet IV: Left Antecubital Pain: 0/10 Tubes: none Safety Measures: Safety Fall Prevention Plan discussed with patient. Admission: Completed [facility] [place] Orientation: Patient has been orientated to the room, unit and the staff. Family: None Orders have been reviewed and are being implemented. Will continue to monitor the patient. Call light has been placed within reach and bed alarm has been activated.  ? Donia Guiles, RN  Phone Number: [phone]

## 2020-02-28 NOTE — Progress Notes (Signed)
Pt is becoming increasingly agitated and keeps trying to pull out her IV line and jump out of bed unattended. On call internal medicine paged. New orders received. Will cointinue to monitor.

## 2020-02-28 NOTE — H&P (Signed)
History and Physical    Krina Mraz WUJ:[id number] DOB: Jan 04, 1956 DOA: 02/27/2020  PCP: System, Provider Not In (Confirm with patient/family/NH records and if not entered, this has to be entered at [facility] point of entry) Patient coming from: home  I have personally briefly reviewed patient's old medical records in [facility]  Chief Complaint: hallucinations  HPI: Jessica Campbell is a 64 y.o. female with medical history significant of schizoaffective w/ paranoia disorder, hypertension, HLD, diabetes, depression, presents to the emergency department with a chief complaint of hallucinations.  She is accompanied by her sister, with whom she lives with.  Sister reports that she has been seeing animals, bugs, and babies crawling around the floor while in the waiting room.  She states that her sister was hearing voices which awakened her from sleep last night at around 3 AM.  She reports that her sister has been awake since then.  She denies any alcohol or drug use.  Denies any medication changes.  She was admitted for similar symptoms in [place] in June of this year. She was hydrated and stablized. She was transferred to in-patient psychiatry when she was stable.  She denies any fever, chills, cough, or shortness of breath.  Denies any other associated symptoms.   ED Course: T 97.7  120/80  HR 67  RR 17. ED-PA exam notable for dry mucus membranes. Lab significant for elevated Cr at 2. 31 (baseline 1.3). [facility] called to admit to continue treatment for AKI and exacerbation of psychiatric problems.   Review of Systems: As per HPI otherwise 10 point review of systems negative.    Past Medical History:  Diagnosis Date  . Borderline diabetes   . Depression   . Diabetes mellitus without complication (HCC)   . High cholesterol   . Hypertension   . Schizoaffective disorder (HCC)   . Scoliosis     Past Surgical History:  Procedure Laterality Date  . ABDOMINAL HYSTERECTOMY      Soc Hx -  married 15 yrs - widowed. No children. Lost her mother in March with whom she was living. Now lives with her sister. She is a [facility] patient.    reports that she has never smoked. She has never used smokeless tobacco. She reports that she does not drink alcohol and does not use drugs.  No Known Allergies  No family history on file.   Prior to Admission medications   Medication Sig Start Date End Date Taking? Authorizing Provider  ARIPiprazole (ABILIFY) 15 MG tablet Take 15 mg by mouth daily.    [provider]  atorvastatin (LIPITOR) 40 MG tablet Take 40 mg by mouth daily.    [provider]  estradiol (ESTRACE) 1 MG tablet Take 1 mg by mouth daily.    [provider]  ezetimibe (ZETIA) 10 MG tablet Take 10 mg by mouth daily.    [provider]  FLUoxetine (PROZAC) 20 MG tablet Take 50 mg by mouth daily.    [provider]  fluPHENAZine (PROLIXIN) 1 MG tablet Take 1 mg by mouth daily.    [provider]  gabapentin (NEURONTIN) 300 MG capsule Take 300 mg by mouth 3 (three) times daily.    [provider]  glipiZIDE (GLUCOTROL XL) 10 MG 24 hr tablet Take 10 mg by mouth daily with breakfast.    [provider]  glipizide-metformin (METAGLIP) 2.5-250 MG tablet Take 1 tablet by mouth 2 (two) times daily before a meal.    [provider]  metFORMIN (GLUCOPHAGE) 1000 MG tablet Take 1,000 mg by mouth 2 (two) times daily with a meal.    [provider]  sertraline (ZOLOFT) 50 MG tablet Take 50 mg by mouth daily.    [provider]  traMADol (ULTRAM) 50 MG tablet Take 1 tablet (50 mg total) by mouth every 8 (eight) hours as needed for pain. 12/29/12   Elwin Mocha, MD  trazodone (DESYREL) 300 MG tablet Take 300 mg by mouth at bedtime.    [provider]  triamterene-hydrochlorothiazide (DYAZIDE) 37.5-25 MG per capsule Take 1 capsule by mouth every morning.    [provider]    Physical  Exam: Vitals:   02/27/20 1559 02/27/20 1734 02/27/20 2001 02/27/20 2245  BP: (!) 117/103 (!) 137/102 (!) 118/40 120/80  Pulse: 71 85 77 67  Resp: 20 18 20 17   Temp: 97.7 F (36.5 C)     TempSrc: Oral     SpO2: 99% 98% 100% 99%     Vitals:   02/27/20 1559 02/27/20 1734 02/27/20 2001 02/27/20 2245  BP: (!) 117/103 (!) 137/102 (!) 118/40 120/80  Pulse: 71 85 77 67  Resp: 20 18 20 17   Temp: 97.7 F (36.5 C)     TempSrc: Oral     SpO2: 99% 98% 100% 99%   General:  WNWD, nicely groomed woman who is very tremulous due to her medications. Eyes: PERRL, lids and conjunctivae normal ENMT: Mucous membranes are dry. Posterior pharynx clear of any exudate or lesions.Normal dentition.  Neck: normal, supple, no masses, no thyromegaly Respiratory: clear to auscultation bilaterally, no wheezing, no crackles. Normal respiratory effort. No accessory muscle use.  Cardiovascular: Regular rate and rhythm, no murmurs / rubs / gallops. No extremity edema. 2+ pedal pulses. No carotid bruits.  Abdomen: no tenderness, no masses palpated. No hepatosplenomegaly. Bowel sounds positive.  Musculoskeletal: no clubbing / cyanosis. No joint deformity upper and lower extremities. Good ROM, no contractures. Normal muscle tone.  Skin: no rashes, lesions, ulcers. No induration Neurologic: CN 2-12 grossly intact. Strength 5/5 in all 4.  Psychiatric: Normal judgment and insight. Alert and oriented x 3. Tremulous and mildly agitated but in a pleasant mood. Admits to active hallucinations that are non-threatening. Admits to rare episodes of hearing voices but not threatening or directive.     Labs on Admission: I have personally reviewed following labs and imaging studies  CBC: Recent Labs  Lab 02/27/20 1609  WBC 6.8  HGB 9.8*  HCT 32.1*  MCV 89.4  PLT 276   Basic Metabolic Panel: Recent Labs  Lab 02/27/20 1609  NA 140  K 3.8  CL 104  CO2 21*  GLUCOSE 116*  BUN 23  CREATININE 2.31*  CALCIUM 9.9    GFR: CrCl cannot be calculated (Unknown ideal weight.). Liver Function Tests: Recent Labs  Lab 02/27/20 1609  AST 22  ALT 26  ALKPHOS 56  BILITOT 0.8  PROT 7.2  ALBUMIN 4.2   No results for input(s): LIPASE, AMYLASE in the last 168 hours. No results for input(s): AMMONIA in the last 168 hours. Coagulation Profile: No results for input(s): INR, PROTIME in the last 168 hours. Cardiac Enzymes: No results for input(s): CKTOTAL, CKMB, CKMBINDEX, TROPONINI in the last 168 hours. BNP (last 3 results) No results for input(s): PROBNP in the last 8760 hours. HbA1C: No results for input(s): HGBA1C in the last 72 hours. CBG: Recent Labs  Lab 02/27/20 1718  GLUCAP 89   Lipid Profile: No results  for input(s): CHOL, HDL, LDLCALC, TRIG, CHOLHDL, LDLDIRECT in the last 72 hours. Thyroid Function Tests: No results for input(s): TSH, T4TOTAL, FREET4, T3FREE, THYROIDAB in the last 72 hours. Anemia Panel: No results for input(s): VITAMINB12, FOLATE, FERRITIN, TIBC, IRON, RETICCTPCT in the last 72 hours. Urine analysis: No results found for: COLORURINE, APPEARANCEUR, LABSPEC, PHURINE, GLUCOSEU, HGBUR, BILIRUBINUR, KETONESUR, PROTEINUR, UROBILINOGEN, NITRITE, LEUKOCYTESUR  Radiological Exams on Admission: No results found.  EKG: Independently reviewed. No EKG done in ED  Assessment/Plan Active Problems:   AKI (acute kidney injury) (HCC)   Paranoid schizophrenia, chronic condition with acute exacerbation (HCC)   DM (diabetes mellitus), type 2 (HCC)   HTN (hypertension), benign   HLD (hyperlipidemia)   Acute kidney injury (HCC)    1. AKI - patient with prerenal azotemia 2/2 dehydration, a result of psychiatric issues. She received 1 L IVF in ED Plan Regular med-surg admit  IVF 1/2NS at 100 cc hr  F/u Bment in AM  2. Psychiatric - patient with increased hallucinations, poor care of self. She reports that she is taking all her psychiatric meds. Has not had med visit since July by  her report. Plan Continue regimen per medication reconciliation  Psychiatric consultation in AM  3. HTN - stable with BOP 120/80. No antihypertensives on home med list. At time of d/c from hospital in [place] she was on amolodipine and ACE-I Plan No antihypertensives at this time  Observation with resumption of CCB if BP rises  4. DM - patient on sulfonylurea and metformin. At time of last d/c she was started on Januvia. Serum glucose at admission 116.  Plan Hold metformin 2/2 AKI  Hold long acting sulfonylurea  Follow with sliding scale  A1C ordered any pending.    5. HLD - continue home meds  DVT prophylaxis: lovenox  Code Status: full code  Family Communication: Sister was present for ED-PA evaluation. Did call due to the late hour  Disposition Plan: home when stable and to continue with [facility]  Consults called: will need to consult Psychiatry in AM  Admission status: obs    Illene Regulus MD [facility] Pager [phone]  If 7PM-7AM, please contact night-coverage [email or web address] Password [facility]  02/28/2020, 12:17 AM

## 2020-02-28 NOTE — Plan of Care (Signed)
  Problem: Education: Goal: Knowledge of General Education information will improve Description Including pain rating scale, medication(s)/side effects and non-pharmacologic comfort measures Outcome: Progressing   

## 2020-02-28 NOTE — Evaluation (Signed)
Physical Therapy Evaluation Patient Details Name: Jessica Campbell MRN: [id number] DOB: September 13, 1955 Today's Date: 02/28/2020   History of Present Illness  Jessica Campbell is a 64 y.o. with PMH of schizoaffective disorder with paranoia, HTN, HLD, DM, depression admitted for hallucinations and durg induced extrapyramidal movement disorder.  Clinical Impression  Prior to admission, pt states she lives alone and is independent with mobility/ADL's; however, she also endorses history of recurrent falls. Pt reports she has a sister who assists with meals. Pt ambulating 100 feet with min assist; displays narrow BOS with scissoring gait pattern. Presents as a high fall risk based on decreased gait speed, safety awareness, history of falls, and poor static/dynamic balance. Due to deficits and decreased caregiver support, recommending SNF at discharge.     Follow Up Recommendations SNF    Equipment Recommendations  Rolling walker with 5" wheels;3in1 (PT)    Recommendations for Other Services       Precautions / Restrictions Precautions Precautions: Fall Restrictions Weight Bearing Restrictions: No      Mobility  Bed Mobility Overal bed mobility: Needs Assistance Bed Mobility: Supine to Sit;Sit to Supine     Supine to sit: Supervision Sit to supine: Supervision        Transfers Overall transfer level: Needs assistance Equipment used: 1 person hand held assist Transfers: Sit to/from Stand Sit to Stand: Min assist         General transfer comment: MinA to rise and initially steady  Ambulation/Gait Ambulation/Gait assistance: Min assist Gait Distance (Feet): 100 Feet Assistive device: 1 person hand held assist;Rolling walker (2 wheeled) Gait Pattern/deviations: Step-through pattern;Decreased stride length;Narrow base of support;Scissoring Gait velocity: decreased Gait velocity interpretation: <1.31 ft/sec, indicative of household ambulator General Gait Details: Pt with  occasional scissoring, decreased heel strike, narrow BOS. MinA for stability. Pt with slight improvement with use of RW vs HHA  Stairs            Wheelchair Mobility    Modified Rankin (Stroke Patients Only)       Balance Overall balance assessment: Needs assistance Sitting-balance support: Feet supported Sitting balance-Leahy Scale: Good     Standing balance support: Single extremity supported;During functional activity Standing balance-Leahy Scale: Poor Standing balance comment: reliant on at least single UE support                             Pertinent Vitals/Pain Pain Assessment: Faces Faces Pain Scale: Hurts a little bit Pain Location: back Pain Descriptors / Indicators: Sore Pain Intervention(s): Monitored during session    Home Living Family/patient expects to be discharged to:: Private residence Living Arrangements: Alone Available Help at Discharge: Family;Available PRN/intermittently (sister) Type of Home: House Home Access: Stairs to enter   Secretary/administrator of Steps: 2 Home Layout: Two level        Prior Function Level of Independence: Needs assistance   Gait / Transfers Assistance Needed: does not use AD; reports several falls in past month, once down the steps from second floor  ADL's / Homemaking Assistance Needed: independent ADL's, sister assists with meals intermittently         Hand Dominance        Extremity/Trunk Assessment   Upper Extremity Assessment Upper Extremity Assessment: RUE deficits/detail;LUE deficits/detail RUE Coordination: decreased fine motor;decreased gross motor LUE Coordination: decreased fine motor;decreased gross motor    Lower Extremity Assessment Lower Extremity Assessment: RLE deficits/detail;LLE deficits/detail RLE Coordination: decreased gross motor LLE Coordination: decreased gross  motor    Cervical / Trunk Assessment Cervical / Trunk Assessment: Normal  Communication    Communication: No difficulties  Cognition Arousal/Alertness: Awake/alert Behavior During Therapy: WFL for tasks assessed/performed Overall Cognitive Status: No family/caregiver present to determine baseline cognitive functioning                                 General Comments: Pt with psych history, follows all commands, decreased awareness of safety/deficits. Pt denying current hallucinations but reports she typically sees a man with a child playing with a ball. Pt stating she fell off the [facility] today in the room but RN states this is not true.      General Comments      Exercises     Assessment/Plan    PT Assessment Patient needs continued PT services  PT Problem List Decreased strength;Decreased activity tolerance;Decreased balance;Decreased mobility;Decreased coordination;Decreased cognition;Decreased safety awareness       PT Treatment Interventions DME instruction;Gait training;Functional mobility training;Therapeutic activities;Therapeutic exercise;Balance training;Patient/family education    PT Goals (Current goals can be found in the Care Plan section)  Acute Rehab PT Goals Patient Stated Goal: improved balance PT Goal Formulation: With patient Time For Goal Achievement: 03/13/20 Potential to Achieve Goals: Good    Frequency Min 2X/week   Barriers to discharge        Co-evaluation               AM-PAC PT "6 Clicks" Mobility  Outcome Measure Help needed turning from your back to your side while in a flat bed without using bedrails?: None Help needed moving from lying on your back to sitting on the side of a flat bed without using bedrails?: None Help needed moving to and from a bed to a chair (including a wheelchair)?: A Little Help needed standing up from a chair using your arms (e.g., wheelchair or bedside chair)?: A Little Help needed to walk in hospital room?: A Little Help needed climbing 3-5 steps with a railing? : A Lot 6 Click Score:  19    End of Session Equipment Utilized During Treatment: Gait belt Activity Tolerance: Patient tolerated treatment well Patient left: with call bell/phone within reach;in bed;with bed alarm set Nurse Communication: Mobility status PT Visit Diagnosis: Unsteadiness on feet (R26.81);History of falling (Z91.81);Difficulty in walking, not elsewhere classified (R26.2)    Time: 2409-7353 PT Time Calculation (min) (ACUTE ONLY): 14 min   Charges:   PT Evaluation $PT Eval Moderate Complexity: 1 Mod          Lillia Pauls, PT, DPT Acute Rehabilitation Services Pager [phone] Office [phone]   Norval Morton 02/28/2020, 5:01 PM

## 2020-02-28 NOTE — Progress Notes (Addendum)
Subjective:  Fabiha Rougeau is a 64 y.o. with PMH of schizoaffective disorder with paranoia, HTN, HLD, DM, Depression admit for hallucinations on hospital day 0  Ms.Elbert was admitted to triad hospitalist service and transferred to care of Internal Medicine Teaching Service this am. Overnight admission for visual hallucinations. Noted to have had episodes of agitation and hallucinations in the ED with 'bugs crawling over her.'  Ms.Milberger was examined and evaluated at bedside this am. She was observed resting comfortably in bed. She mentions that she has been seeing a 'man' in the corner of his eye who has been telling him to follow her since August. She mentions that this man appears often in her dreams but also is present occasionally when she is awake. She denies any other hallucinations or suicidal/homicidal ideations. She mentions that she presented to [facility] after her providers at [facility] told her to go. When reviewing her prior hospitalization for AKI, she states she knows she should drink more but does not like to drink water and likes to 'drink Kool-Aid' instead. Denies any fevers, chills, chest pain, dyspnea, nausea, vomiting, dysuria, urinary urgency, or urinary frequency  Objective:  Vital signs in last 24 hours: Vitals:   02/28/20 0045 02/28/20 0200 02/28/20 0537 02/28/20 0646  BP: 121/60 138/73 129/63 (!) 154/104  Pulse: 69 93 82 76  Resp:  (!) 22 (!) 22 18  Temp:  98.8 F (37.1 C) 98.7 F (37.1 C) 97.8 F (36.6 C)  TempSrc:  Oral Oral Oral  SpO2: 98% 100% 99% 96%  Weight:    68.3 kg   Gen: Well-developed, well nourished, NAD HEENT: NCAT head, hearing intact, EOMI, MMM Neck: supple, ROM intact, no JVD CV: RRR, S1, S2 normal, No rubs, no murmurs, no gallops Pulm: CTAB, No rales, no wheezes Abd: Soft, BS+, NTND, No rebound, no guarding Extm: ROM intact, Peripheral pulses intact, No peripheral edema Skin: Dry, Warm, normal turgor, no rashes, lesions, wounds.   Neuro: AAOx3, Tremulous Psych: Normal mood and affect, + visual hallucinations  Assessment/Plan:  Active Problems:   AKI (acute kidney injury) (HCC)   DM (diabetes mellitus), type 2 (HCC)   HTN (hypertension), benign   Paranoid schizophrenia, chronic condition with acute exacerbation (HCC)   HLD (hyperlipidemia)   Acute kidney injury (HCC)  Elfreida Cassar is a 64 y.o. with PMH of schizoaffective disorder with paranoia, HTN, HLD, DM, Depression admit for visual hallucinations  Visual Hallucinations Schizoaffective Disorder Prior hx of schizoaffective disorder with hospitalization in June to behavioral health. Presenting with visual hallucinations. Mentions currently taking her psych meds but states her psychiatrist left couple months ago. [facility] confirms psych med administration with Abilify 15mg . Noted to have been on Prolixin 25mg  long-acting injections as well as valbenazine 80mg  daily which was discontinued about a month ago due to adverse effects. Likely will need adjustment to her psych regimen. Possibly need behavioral health admission. - Consult to behavioral health  Drug-induced extrapyramidal movement disorder On exam noted to be tremulous with similar findings on chart review. Attributed to extrapyramidal movements related to anti-psychotics. Currently on Cogentin and amantadine - C/w home meds: amantadine 100mg  BID, bentropine 1mg  am, 0.5mg  pm  Acute Kidney Injury on Chronic Kidney Disease stage 3a Has prior hx of CKD 3a with baseline BUN of 11, Creatinine of 1.7 per PCP's office from 2 weeks prior. On admission BUN 23, Creatine 2.31. Appeared hypovolemic per chart review and received 1L bolus in ED. Am creatinine not improved with Bun of 24, Creatinine  of 2.48. Bladder scan w/o significant volume. Will do further work-up - UA - Renal US - LR 125cc/hr - Trend renal fx - Avoid nephrotoxic meds when able  DVT prophx: subqhep Diet: CM/HH Bowel: N/A Code: Full  Prior to  Admission Living Arrangement: Home Anticipated Discharge Location: Behavioral health Barriers to Discharge: Medical treatment Dispo: Anticipated discharge in approximately 0-1 day(s).   Theotis Barrio, MD 02/28/2020, 7:47 AM Pager: [phone] After 5pm on weekdays and 1pm on weekends: On Call Pager: [phone]

## 2020-02-28 NOTE — ED Notes (Signed)
Pt provided with [place] sandwich and beef broth.

## 2020-02-29 ENCOUNTER — Inpatient Hospital Stay (HOSPITAL_COMMUNITY): Payer: Medicare (Managed Care)

## 2020-02-29 LAB — BASIC METABOLIC PANEL
Anion gap: 10 (ref 5–15)
BUN: 16 mg/dL (ref 8–23)
CO2: 20 mmol/L — ABNORMAL LOW (ref 22–32)
Calcium: 9.1 mg/dL (ref 8.9–10.3)
Chloride: 112 mmol/L — ABNORMAL HIGH (ref 98–111)
Creatinine, Ser: 1.76 mg/dL — ABNORMAL HIGH (ref 0.44–1.00)
GFR, Estimated: 32 mL/min — ABNORMAL LOW (ref >60)
Glucose, Bld: 98 mg/dL (ref 70–99)
Potassium: 3.7 mmol/L (ref 3.5–5.1)
Sodium: 142 mmol/L (ref 135–145)

## 2020-02-29 LAB — CBC
HCT: 26.8 % — ABNORMAL LOW (ref 36.0–46.0)
Hemoglobin: 8.6 g/dL — ABNORMAL LOW (ref 12.0–15.0)
MCH: 28.3 pg (ref 26.0–34.0)
MCHC: 32.1 g/dL (ref 30.0–36.0)
MCV: 88.2 fL (ref 80.0–100.0)
Platelets: 223 10*3/uL (ref 150–400)
RBC: 3.04 MIL/uL — ABNORMAL LOW (ref 3.87–5.11)
RDW: 14.2 % (ref 11.5–15.5)
WBC: 6.4 10*3/uL (ref 4.0–10.5)
nRBC: 0 % (ref 0.0–0.2)

## 2020-02-29 LAB — GLUCOSE, CAPILLARY
Glucose-Capillary: 95 mg/dL (ref 70–99)
Glucose-Capillary: 137 mg/dL — ABNORMAL HIGH (ref 70–99)
Glucose-Capillary: 77 mg/dL (ref 70–99)
Glucose-Capillary: 94 mg/dL (ref 70–99)

## 2020-02-29 MED ORDER — LORAZEPAM 1 MG PO TABS
1.0000 mg | ORAL_TABLET | ORAL | Status: AC | PRN
  Administered 2020-03-01: 1 mg via ORAL
  Filled 2020-02-29 (×2): qty 1

## 2020-02-29 MED ORDER — ZIPRASIDONE MESYLATE 20 MG IM SOLR
20.0000 mg | INTRAMUSCULAR | Status: DC | PRN
  Filled 2020-02-29: qty 20

## 2020-02-29 MED ORDER — HALOPERIDOL LACTATE 5 MG/ML IJ SOLN: 5.0000 mg | Freq: Once | INTRAMUSCULAR | Status: AC

## 2020-02-29 MED ORDER — HALOPERIDOL LACTATE 5 MG/ML IJ SOLN
INTRAMUSCULAR | Status: AC
  Administered 2020-02-29: 5 mg via INTRAMUSCULAR
  Filled 2020-02-29: qty 1

## 2020-02-29 MED ORDER — OLANZAPINE 5 MG PO TBDP
10.0000 mg | ORAL_TABLET | Freq: Three times a day (TID) | ORAL | Status: DC | PRN
  Administered 2020-03-02 – 2020-03-04 (×2): 10 mg via ORAL
  Filled 2020-02-29 (×4): qty 2

## 2020-02-29 NOTE — Progress Notes (Signed)
Patient is medically cleared for psychiatric disposition.

## 2020-02-29 NOTE — Progress Notes (Signed)
   Subjective: HD#1 Patient was agitated at night and received 2 mg IV Haldol followed by 5 mg and was put in soft restraints.   Patient evaluated at bedside this morning. She notes that she has a history of schizophrenia for which she is on medication and notes that she was compliant with her medication prior to admission. She notes that they are helping a little bit. She notes feeling okay this morning but feels "under the weather" due to her nonproductive cough. Constant tremors for which she takes medication that helps.  Continues to have visual and auditory hallucinations.   Objective:  Vital signs in last 24 hours: Vitals:   02/28/20 1605 02/28/20 2047 02/29/20 0457 02/29/20 0935  BP: (!) 142/59 (!) 133/102 (!) 124/93 136/68  Pulse: 89 88 76 67  Resp: 18  18 18   Temp: 98.5 F (36.9 C) 98.5 F (36.9 C) 98.9 F (37.2 C) 97.8 F (36.6 C)  TempSrc: Oral Oral Oral   SpO2: 96% 98% 99% 97%  Weight:  68.3 kg     General: Well developed, well nourished, NAD HEENT: [place]/NT, Hearing intact, MMM Neck: Supple, ROM intact Cardiovascular: RRR, S1, S2 normal, No rubs, no murmurs, no gallops Pulmonary: Lungs clear to ausculation BL, No rales, no wheezes. Extremities: ROM intact, peripheral pulses intact, No peripheral edema. Neuro; AAO*3 Psych: + Visual hallucinations & Auditory hallucnations. ( denies command hallucinations)   Assessment/Plan:  Active Problems:   AKI (acute kidney injury) (HCC)   DM (diabetes mellitus), type 2 (HCC)   HTN (hypertension), benign   Paranoid schizophrenia, chronic condition with acute exacerbation (HCC)   HLD (hyperlipidemia)   Acute kidney injury (HCC)   Hallucinations  Jessica Campbell is a 64 y.o. with PMH of schizoaffective disorder with paranoia, HTN, HLD, DM, Depression admitted for acute kidney injury and visual Hallucinations.   # Schizoaffective disorder Patient presents with visual hallucinations this morning, sees her mother. Endorses  auditory hallucinations but denies command hallucination. Denies suicidal or homicidal ideations. Consulted psych via telephone and they suggest social worker involvement to find inpatient bed for her in ger psychiatry unit. SW are informed and they are working actively on it.   - Agitation protocol in action  - C/W Abilify 15 mg - Zoloft 50 mg Q day - Trazodone 300 mg for sleep  Drug-induced extrapyramidal movement disorder On examination today she noted to be tremulous and states this is her baseline.  Attributed to extrapyramidal movements related to anti-psychotics. Currently on Cogentin and amantadine  - C/w home meds: amantadine 100mg  BID, bentropine 1mg  am, 0.5mg  pm  Acute Kidney Injury on Chronic Kidney Disease stage 3a Her Creatinine is 1.71 this morning, trended down from 2.23. BUN is 16 from 21 yesterday. Patient states she fixes her food herself and might not be eating/ drinking enough. U/A shows moderate leukocytes and rare bacteria. U/S renal does not show any evidence of obstructive uropathy.  - Trend renal fx - Avoid nephrotoxic meds when able  Prior to Admission Living Arrangement: Home Anticipated Discharge Location: Behavioral health Barriers to Discharge: Disposition Dispo: Anticipated discharge in approximately 1-2 day(s).   65, MD 02/29/2020, 11:34 AM Pager: [phone] After 5pm on weekdays and 1pm on weekends: On Call pager [phone]

## 2020-02-29 NOTE — Progress Notes (Signed)
OT Cancellation Note  Patient Details Name: Jessica Campbell MRN: [id number] DOB: 23-Apr-1955   Cancelled Treatment:    Reason Eval/Treat Not Completed: Other (comment). Pt with psychiatry. Apparently actively hallucinating. Will attempt to see later date.   Thornell Mule, OT/L   Acute OT Clinical Specialist Acute Rehabilitation Services Pager [phone] Office [phone]  02/29/2020, 3:28 PM

## 2020-02-29 NOTE — Progress Notes (Addendum)
Paged by RN [phone] who reports patient remains agitated, trying to take her IV out, get out of bed, and leave the room despite administration of IV haldol 2 mg at 2216 then additional 5 mg at 0036. RN requests soft restraints. Order placed and presented to patient's bedside.  SUBJECTIVE: Patient states she is feeling "alright." States she wants her sister to get her a pillow. Myself and RN are the only other people in the room. When asked who else is in the room, the patient states her sister, and points to the corner of the room. She denies pain or other discomfort.   OBJECTIVE:   General: Patient sitting upright in bed with soft restraints on bilateral wrists. She is pleasant and well-appearing. Fidgeting in bed. IVF have been disconnected due to patient's agitation. HENT: Mucous membranes dry CV: Regular rate and rhythm, no m/r/g Abdominal: soft, non-tender, non-distended Psych: Patient is speaking rapidly though directable. Answers my questions. Endorsing visual hallucinations of her sister in the room. Speaks out loud to this sister.  A/P: Per nurse, patient's agitation has improved following administration of Haldol in addition to soft restraints. Discussed goal of removing restraints as soon as possible with frequent reevaluation. - Remove non-violent restraints as soon as possible - Frequent reevaluation - Delirium precautions - Fall precautions  * The above documentation was entered by Dr. Alphonzo Severance.  In an attempt to cosign this note, I have inadvertently replaced her name with mine.  Charissa Bash, MD

## 2020-02-29 NOTE — Progress Notes (Signed)
Patient has continuously tried to get out of bed and rip her IV out, leave the room, swing at nurses. Was unable to be redirected. 2 previous attempts to help calm patient with PRN Haldol were unsucessfiul. No safety attendant available in the hospital. To prevent patient from falling, MD requested to come to bedside and restraint order placed.

## 2020-02-29 NOTE — Consult Note (Signed)
[facility] Face-to-Face Psychiatry Consult   Reason for Consult: Visual hallucinations Referring Physician:  Dr. Mayford Knife Patient Identification: Jessica Campbell MRN:  [id number] Principal Diagnosis: <principal problem not specified> Diagnosis:  Active Problems:   AKI (acute kidney injury) (HCC)   DM (diabetes mellitus), type 2 (HCC)   HTN (hypertension), benign   Paranoid schizophrenia, chronic condition with acute exacerbation (HCC)   HLD (hyperlipidemia)   Acute kidney injury (HCC)   Hallucinations   Total Time spent with patient: 1 hour  Subjective:   Jessica Campbell is a 64 y.o. female patient admitted with hallucination.  Psychiatric consult was placed for hallucinations, patient with known history of schizoaffective disorder.  Patient seen and assessed by this nurse practitioner.  She is observed to be sitting upright in bed, very tremulous, restless, and actively hallucinating.  She is observed holding a banana in the air, when asked what was she doing with the banana she stated" I am trying to give it to my mother who is here in the corner.  Here take this banana.  I will finished with it. "  There were no additional people present besides myself and her bedside nurse.  Patient reports an extensive history of schizophrenia, chronic hallucinations that continue to progress.  She reports having a trial of multiple psychotropic medication to include Haldol, paliperidone, quetiapine, risperidone.  Most recently she was admitted to [facility] in June for similar presentation.  At that time she was started on Abilify.  She is unable to identify if she has been taking her medication daily, and appears to have limited insight at this time.  She does offer support noting that she has 2 sisters who come over to help her, otherwise she lives at home alone.  She denies any suicidal thoughts at this time, and or homicidal thoughts.  HPI:  Jessica Campbell is a 64 y.o. female with medical history  significant of schizoaffective w/ paranoia disorder, hypertension, HLD, diabetes, depression, presents to the emergency department with a chief complaint of hallucinations. She is accompanied by her sister, with whom she lives with. Sister reports that she has been seeing animals, bugs, and babies crawling around the floor while in the waiting room. She states that her sister was hearing voices which awakened her from sleep last night at around 3 AM. She reports that her sister has been awake since then. She denies any alcohol or drug use. Denies any medication changes. She was admitted for similar symptoms in [place] in June of this year. She was hydrated and stablized. She was transferred to in-patient psychiatry when she was stable. She denies any fever, chills, cough, or shortness of breath. Denies any other associated symptoms.    During the evaluation patient is observed to be very paranoid, disorganized, with some strange behaviors.  She is alert and oriented x2, with some limited mentation, judgment, and poor insight.  She also has difficulty forming thoughts and unable to link casual relationships such as today's date and correlation with Thanksgiving.  Patient is experiencing some hallucinations, thought blocking, and disorganized thoughts.  She also has notable cognitive impairment, and there is question about her compliance with her psychiatric medication.  She denies any suicidal ideations.  Past Psychiatric History: Paranoid Schizophrenia. Has tried multiple antipsychotics to include both oral and injectable. REcently she has had multiple psychiatric admissions for her worsening hallucinations and paranoia. She denies any suicide attempts.   Risk to Self:   Denies Risk to Others:   Denies Prior  Inpatient Therapy:   Yes, recently 09/20/2019 at [facility] Prior Outpatient Therapy:   [facility]   Past Medical History:  Past Medical History:  Diagnosis Date  . Borderline diabetes   .  Depression   . Diabetes mellitus without complication (HCC)   . High cholesterol   . Hypertension   . Schizoaffective disorder (HCC)   . Scoliosis     Past Surgical History:  Procedure Laterality Date  . ABDOMINAL HYSTERECTOMY     Family History: No family history on file. Family Psychiatric  History: Unable to assess Social History:  Social History   Substance and Sexual Activity  Alcohol Use No     Social History   Substance and Sexual Activity  Drug Use No    Social History   Socioeconomic History  . Marital status: Single    Spouse name: Not on file  . Number of children: Not on file  . Years of education: Not on file  . Highest education level: Not on file  Occupational History  . Not on file  Tobacco Use  . Smoking status: Never Smoker  . Smokeless tobacco: Never Used  Substance and Sexual Activity  . Alcohol use: No  . Drug use: No  . Sexual activity: Not on file  Other Topics Concern  . Not on file  Social History Narrative  . Not on file   Social Determinants of Health   Financial Resource Strain:   . Difficulty of Paying Living Expenses: Not on file  Food Insecurity:   . Worried About Programme researcher, broadcasting/film/video in the Last Year: Not on file  . Ran Out of Food in the Last Year: Not on file  Transportation Needs:   . Lack of Transportation (Medical): Not on file  . Lack of Transportation (Non-Medical): Not on file  Physical Activity:   . Days of Exercise per Week: Not on file  . Minutes of Exercise per Session: Not on file  Stress:   . Feeling of Stress : Not on file  Social Connections:   . Frequency of Communication with Friends and Family: Not on file  . Frequency of Social Gatherings with Friends and Family: Not on file  . Attends Religious Services: Not on file  . Active Member of Clubs or Organizations: Not on file  . Attends Banker Meetings: Not on file  . Marital Status: Not on file   Additional Social History:     Allergies:   Allergies  Allergen Reactions  . Paliperidone     Other reaction(s): Hives / Skin Rash  . Quetiapine Fumarate Rash    Other reaction(s): Hives / Skin Rash  . Risperidone Rash    Other reaction(s): Hives / Skin Rash  . Diphenhydramine Hcl     Other reaction(s): Difficulty breathing, Hives / Skin Rash  . Latex Rash    Other reaction(s): Rash, Rash Other reaction(s): DERMATITIS Other reaction(s): DERMATITIS     Labs:  Results for orders placed or performed during the hospital encounter of 02/27/20 (from the past 48 hour(s))  Comprehensive metabolic panel     Status: Abnormal   Collection Time: 02/27/20  4:09 PM  Result Value Ref Range   Sodium 140 135 - 145 mmol/L   Potassium 3.8 3.5 - 5.1 mmol/L   Chloride 104 98 - 111 mmol/L   CO2 21 (L) 22 - 32 mmol/L   Glucose, Bld 116 (H) 70 - 99 mg/dL    Comment: Glucose reference range applies only  to samples taken after fasting for at least 8 hours.   BUN 23 8 - 23 mg/dL   Creatinine, Ser 1.61 (H) 0.44 - 1.00 mg/dL   Calcium 9.9 8.9 - 09.6 mg/dL   Total Protein 7.2 6.5 - 8.1 g/dL   Albumin 4.2 3.5 - 5.0 g/dL   AST 22 15 - 41 U/L   ALT 26 0 - 44 U/L   Alkaline Phosphatase 56 38 - 126 U/L   Total Bilirubin 0.8 0.3 - 1.2 mg/dL   GFR, Estimated 23 (L) >60 mL/min    Comment: (NOTE) Calculated using the CKD-EPI Creatinine Equation (2021)    Anion gap 15 5 - 15    Comment: Performed at [facility], [address] [place], [place] [address]  Ethanol     Status: None   Collection Time: 02/27/20  4:09 PM  Result Value Ref Range   Alcohol, Ethyl (B) <10 <10 mg/dL    Comment: (NOTE) Lowest detectable limit for serum alcohol is 10 mg/dL.  For medical purposes only. Performed at [facility], [address], [place], [place] [address]   cbc     Status: Abnormal   Collection Time: 02/27/20  4:09 PM  Result Value Ref Range   WBC 6.8 4.0 - 10.5 K/uL   RBC 3.59 (L) 3.87 - 5.11 MIL/uL   Hemoglobin 9.8 (L)  12.0 - 15.0 g/dL   HCT 14.7 (L) 36 - 46 %   MCV 89.4 80.0 - 100.0 fL   MCH 27.3 26.0 - 34.0 pg   MCHC 30.5 30.0 - 36.0 g/dL   RDW 82.9 56.2 - 13.0 %   Platelets 276 150 - 400 K/uL   nRBC 0.0 0.0 - 0.2 %    Comment: Performed at [facility], [address], [place], [place] [address]  CBG monitoring, ED     Status: None   Collection Time: 02/27/20  5:18 PM  Result Value Ref Range   Glucose-Capillary 89 70 - 99 mg/dL    Comment: Glucose reference range applies only to samples taken after fasting for at least 8 hours.  Respiratory Panel by RT PCR (Flu A&B, Covid) - Nasopharyngeal Swab     Status: None   Collection Time: 02/28/20 12:22 AM   Specimen: Nasopharyngeal Swab; Nasopharyngeal(NP) swabs in vial transport medium  Result Value Ref Range   SARS Coronavirus 2 by RT PCR NEGATIVE NEGATIVE    Comment: (NOTE) SARS-CoV-2 target nucleic acids are NOT DETECTED.  The SARS-CoV-2 RNA is generally detectable in upper respiratoy specimens during the acute phase of infection. The lowest concentration of SARS-CoV-2 viral copies this assay can detect is 131 copies/mL. A negative result does not preclude SARS-Cov-2 infection and should not be used as the sole basis for treatment or other patient management decisions. A negative result may occur with  improper specimen collection/handling, submission of specimen other than nasopharyngeal swab, presence of viral mutation(s) within the areas targeted by this assay, and inadequate number of viral copies (<131 copies/mL). A negative result must be combined with clinical observations, patient history, and epidemiological information. The expected result is Negative.  Fact Sheet for Patients:  https://www.moore.com/  Fact Sheet for Healthcare Providers:  https://www.young.biz/  This test is no t yet approved or cleared by the [place] FDA and  has been authorized for detection and/or diagnosis of  SARS-CoV-2 by FDA under an Emergency Use Authorization (EUA). This EUA will remain  in effect (meaning this test  can be used) for the duration of the COVID-19 declaration under Section 564(b)(1) of the Act, 21 U.S.C. section 360bbb-3(b)(1), unless the authorization is terminated or revoked sooner.     Influenza A by PCR NEGATIVE NEGATIVE   Influenza B by PCR NEGATIVE NEGATIVE    Comment: (NOTE) The Xpert Xpress SARS-CoV-2/FLU/RSV assay is intended as an aid in  the diagnosis of influenza from Nasopharyngeal swab specimens and  should not be used as a sole basis for treatment. Nasal washings and  aspirates are unacceptable for Xpert Xpress SARS-CoV-2/FLU/RSV  testing.  Fact Sheet for Patients: https://www.moore.com/  Fact Sheet for Healthcare Providers: https://www.young.biz/  This test is not yet approved or cleared by the [place] FDA and  has been authorized for detection and/or diagnosis of SARS-CoV-2 by  FDA under an Emergency Use Authorization (EUA). This EUA will remain  in effect (meaning this test can be used) for the duration of the  Covid-19 declaration under Section 564(b)(1) of the Act, 21  U.S.C. section 360bbb-3(b)(1), unless the authorization is  terminated or revoked. Performed at [facility], [address], [place], [place] [address]   HIV Antibody (routine testing w rflx)     Status: None   Collection Time: 02/28/20  5:57 AM  Result Value Ref Range   HIV Screen 4th Generation wRfx Non Reactive Non Reactive    Comment: Performed at [facility], [address] [place], [place] [address]  Basic metabolic panel     Status: Abnormal   Collection Time: 02/28/20  5:57 AM  Result Value Ref Range   Sodium 140 135 - 145 mmol/L   Potassium 3.6 3.5 - 5.1 mmol/L   Chloride 109 98 - 111 mmol/L   CO2 20 (L) 22 - 32 mmol/L   Glucose, Bld 121 (H) 70 - 99 mg/dL    Comment: Glucose reference range applies only to  samples taken after fasting for at least 8 hours.   BUN 24 (H) 8 - 23 mg/dL   Creatinine, Ser 7.00 (H) 0.44 - 1.00 mg/dL   Calcium 9.1 8.9 - 17.4 mg/dL   GFR, Estimated 21 (L) >60 mL/min    Comment: (NOTE) Calculated using the CKD-EPI Creatinine Equation (2021)    Anion gap 11 5 - 15    Comment: Performed at [facility], [address], [place], [place] [address]  Glucose, capillary     Status: Abnormal   Collection Time: 02/28/20  6:50 AM  Result Value Ref Range   Glucose-Capillary 110 (H) 70 - 99 mg/dL    Comment: Glucose reference range applies only to samples taken after fasting for at least 8 hours.  Hemoglobin A1c     Status: Abnormal   Collection Time: 02/28/20  7:05 AM  Result Value Ref Range   Hgb A1c MFr Bld 6.0 (H) 4.8 - 5.6 %    Comment: (NOTE) Pre diabetes:          5.7%-6.4%  Diabetes:              >6.4%  Glycemic control for   <7.0% adults with diabetes    Mean Plasma Glucose 125.5 mg/dL    Comment: Performed at [facility], [address], [place], [place] [address]  Rapid urine drug screen (hospital performed)     Status: None   Collection Time: 02/28/20 10:18 AM  Result Value Ref Range   Opiates NONE DETECTED NONE DETECTED   Cocaine NONE DETECTED NONE  DETECTED   Benzodiazepines NONE DETECTED NONE DETECTED   Amphetamines NONE DETECTED NONE DETECTED   Tetrahydrocannabinol NONE DETECTED NONE DETECTED   Barbiturates NONE DETECTED NONE DETECTED    Comment: (NOTE) DRUG SCREEN FOR MEDICAL PURPOSES ONLY.  IF CONFIRMATION IS NEEDED FOR ANY PURPOSE, NOTIFY LAB WITHIN 5 DAYS.  LOWEST DETECTABLE LIMITS FOR URINE DRUG SCREEN Drug Class                     Cutoff (ng/mL) Amphetamine and metabolites    1000 Barbiturate and metabolites    200 Benzodiazepine                 200 Tricyclics and metabolites     300 Opiates and metabolites        300 Cocaine and metabolites        300 THC                            50 Performed at [facility]  Lab, [address], [place], [place] [address]   Urinalysis, Routine w reflex microscopic Urine, Clean Catch     Status: Abnormal   Collection Time: 02/28/20 10:18 AM  Result Value Ref Range   Color, Urine YELLOW YELLOW   APPearance HAZY (A) CLEAR   Specific Gravity, Urine 1.010 1.005 - 1.030   pH 5.0 5.0 - 8.0   Glucose, UA NEGATIVE NEGATIVE mg/dL   Hgb urine dipstick NEGATIVE NEGATIVE   Bilirubin Urine NEGATIVE NEGATIVE   Ketones, ur NEGATIVE NEGATIVE mg/dL   Protein, ur NEGATIVE NEGATIVE mg/dL   Nitrite NEGATIVE NEGATIVE   Leukocytes,Ua MODERATE (A) NEGATIVE   RBC / HPF 0-5 0 - 5 RBC/hpf   WBC, UA 0-5 0 - 5 WBC/hpf   Bacteria, UA RARE (A) NONE SEEN   Squamous Epithelial / LPF 0-5 0 - 5   Mucus PRESENT    Hyaline Casts, UA PRESENT     Comment: Performed at [facility], [address] [place], [place] [address]  Sodium, urine, random     Status: None   Collection Time: 02/28/20 10:19 AM  Result Value Ref Range   Sodium, Ur 69 mmol/L    Comment: Performed at [facility], [address], [place], [place] [address]  Glucose, capillary     Status: Abnormal   Collection Time: 02/28/20 12:25 PM  Result Value Ref Range   Glucose-Capillary 141 (H) 70 - 99 mg/dL    Comment: Glucose reference range applies only to samples taken after fasting for at least 8 hours.  Basic metabolic panel     Status: Abnormal   Collection Time: 02/28/20  1:02 PM  Result Value Ref Range   Sodium 140 135 - 145 mmol/L   Potassium 3.7 3.5 - 5.1 mmol/L   Chloride 107 98 - 111 mmol/L   CO2 22 22 - 32 mmol/L   Glucose, Bld 137 (H) 70 - 99 mg/dL    Comment: Glucose reference range applies only to samples taken after fasting for at least 8 hours.   BUN 21 8 - 23 mg/dL   Creatinine, Ser 5.62 (H) 0.44 - 1.00 mg/dL   Calcium 9.3 8.9 - 13.0 mg/dL   GFR, Estimated 24 (L) >60 mL/min    Comment: (NOTE) Calculated using the CKD-EPI Creatinine Equation (2021)    Anion gap 11 5 - 15    Comment:  Performed at [facility]  Lab, [address], [place], [place] [address]  Glucose, capillary     Status: None   Collection Time: 02/28/20  4:06 PM  Result Value Ref Range   Glucose-Capillary 97 70 - 99 mg/dL    Comment: Glucose reference range applies only to samples taken after fasting for at least 8 hours.  Glucose, capillary     Status: Abnormal   Collection Time: 02/28/20  8:49 PM  Result Value Ref Range   Glucose-Capillary 133 (H) 70 - 99 mg/dL    Comment: Glucose reference range applies only to samples taken after fasting for at least 8 hours.  CBC     Status: Abnormal   Collection Time: 02/29/20  4:23 AM  Result Value Ref Range   WBC 6.4 4.0 - 10.5 K/uL   RBC 3.04 (L) 3.87 - 5.11 MIL/uL   Hemoglobin 8.6 (L) 12.0 - 15.0 g/dL   HCT 32.2 (L) 36 - 46 %   MCV 88.2 80.0 - 100.0 fL   MCH 28.3 26.0 - 34.0 pg   MCHC 32.1 30.0 - 36.0 g/dL   RDW 02.5 42.7 - 06.2 %   Platelets 223 150 - 400 K/uL   nRBC 0.0 0.0 - 0.2 %    Comment: Performed at [facility], [address], [place], [place] [address]  Basic metabolic panel     Status: Abnormal   Collection Time: 02/29/20  4:23 AM  Result Value Ref Range   Sodium 142 135 - 145 mmol/L   Potassium 3.7 3.5 - 5.1 mmol/L   Chloride 112 (H) 98 - 111 mmol/L   CO2 20 (L) 22 - 32 mmol/L   Glucose, Bld 98 70 - 99 mg/dL    Comment: Glucose reference range applies only to samples taken after fasting for at least 8 hours.   BUN 16 8 - 23 mg/dL   Creatinine, Ser 3.15 (H) 0.44 - 1.00 mg/dL   Calcium 9.1 8.9 - 17.6 mg/dL   GFR, Estimated 32 (L) >60 mL/min    Comment: (NOTE) Calculated using the CKD-EPI Creatinine Equation (2021)    Anion gap 10 5 - 15    Comment: Performed at [facility], [address], [place], [place] [address]  Glucose, capillary     Status: None   Collection Time: 02/29/20  6:56 AM  Result Value Ref Range   Glucose-Capillary 95 70 - 99 mg/dL    Comment: Glucose reference range applies only to samples taken  after fasting for at least 8 hours.  Glucose, capillary     Status: Abnormal   Collection Time: 02/29/20 11:34 AM  Result Value Ref Range   Glucose-Capillary 137 (H) 70 - 99 mg/dL    Comment: Glucose reference range applies only to samples taken after fasting for at least 8 hours.    Current Facility-Administered Medications  Medication Dose Route Frequency Provider Last Rate Last Admin  . acetaminophen (TYLENOL) tablet 650 mg  650 mg Oral Q6H PRN Jacques Navy, MD   650 mg at 02/28/20 1009   Or  . acetaminophen (TYLENOL) suppository 650 mg  650 mg Rectal Q6H PRN Norins, Rosalyn Gess, MD      . amantadine (SYMMETREL) capsule 100 mg  100 mg Oral BID Theotis Barrio, MD   100 mg at 02/29/20 1052  . ARIPiprazole (ABILIFY) tablet 15 mg  15 mg Oral Daily Norins, Rosalyn Gess, MD   15 mg at 02/29/20 1051  . aspirin EC tablet 81  mg  81 mg Oral Daily Theotis BarrioLee, Joshua K, MD   81 mg at 02/29/20 1050  . atorvastatin (LIPITOR) tablet 40 mg  40 mg Oral Daily Norins, Rosalyn GessMichael E, MD   40 mg at 02/29/20 1050  . benztropine (COGENTIN) tablet 1 mg  1 mg Oral Daily Miguel AschoffWilliams, Julie Anne, MD   1 mg at 02/29/20 1052   And  . benztropine (COGENTIN) tablet 0.5 mg  0.5 mg Oral QHS Miguel AschoffWilliams, Julie Anne, MD   0.5 mg at 02/28/20 2037  . heparin injection 5,000 Units  5,000 Units Subcutaneous Q8H Theotis BarrioLee, Joshua K, MD   5,000 Units at 02/28/20 2036  . insulin aspart (novoLOG) injection 0-15 Units  0-15 Units Subcutaneous TID WC Norins, Rosalyn GessMichael E, MD   2 Units at 02/28/20 1229  . OLANZapine zydis (ZYPREXA) disintegrating tablet 10 mg  10 mg Oral Q8H PRN Dolan AmenWinters, Steven, MD       And  . LORazepam (ATIVAN) tablet 1 mg  1 mg Oral PRN Dolan AmenWinters, Steven, MD       And  . ziprasidone (GEODON) injection 20 mg  20 mg Intramuscular PRN Dolan AmenWinters, Steven, MD      . pantoprazole (PROTONIX) EC tablet 40 mg  40 mg Oral Daily Theotis BarrioLee, Joshua K, MD   40 mg at 02/29/20 1050  . senna (SENOKOT) tablet 8.6 mg  1 tablet Oral BID Norins, Rosalyn GessMichael E, MD   8.6 mg  at 02/29/20 1050  . sertraline (ZOLOFT) tablet 50 mg  50 mg Oral Daily Norins, Rosalyn GessMichael E, MD   50 mg at 02/29/20 1050  . traZODone (DESYREL) tablet 300 mg  300 mg Oral QHS Norins, Rosalyn GessMichael E, MD   300 mg at 02/28/20 2036    Musculoskeletal: Strength & Muscle Tone: abnormal and increased Gait & Station: unsteady Patient leans: N/A  Psychiatric Specialty Exam: Physical Exam  Review of Systems  Blood pressure 136/68, pulse 67, temperature 97.8 F (36.6 C), resp. rate 18, weight 68.3 kg, SpO2 97 %.Body mass index is 27.54 kg/m.  General Appearance: Fairly Groomed  Eye Contact:  Fair  Speech:  Clear and Coherent and Normal Rate  Volume:  Normal  Mood:  Anxious  Affect:  Congruent  Thought Process:  Disorganized, Irrelevant and Descriptions of Associations: Loose  Orientation:  Other:  Alert and oriented to self and place  Thought Content:  Delusions, Hallucinations: Auditory Visual and Paranoid Ideation  Suicidal Thoughts:  No  Homicidal Thoughts:  No  Memory:  Immediate;   Fair Recent;   Fair Remote;   Poor  Judgement:  Poor  Insight:  Lacking  Psychomotor Activity:  Increased, TD and Tremor  Concentration:  Concentration: Fair and Attention Span: Fair  Recall:  Poor  Fund of Knowledge:  Fair  Language:  Fair  Akathisia:  Yes  Handed:  Right  AIMS (if indicated):     Assets:  Communication Skills Desire for Improvement Financial Resources/Insurance Housing Leisure Time Physical Health  ADL's:  Impaired  Cognition:  Impaired,  Mild  Sleep:        Treatment Plan Summary: Plan Continue Oral Abilify 15mg  po daily for schizophrenia. Will benefit from Abilify maintenna once she is medically stable.  -Patient is having active hallucinations that are visual in nature, and evidence of decompensation in her schizophrenia.  Will recommend limiting restraints when possible to decrease delirium, agitation, and aggression.  Patient with known mental illness, may develop worsening  symptoms of confusion, agitation, and aggression may benefit from a  one-to-one sitter at nighttime. - Recommend SW consult for inpatient geropsych facility.  - EPS symptoms consistent with TD, will continue Amantadine  po BID. WIll benefit from [place] or [place], however neither are on formulary.   -Abilify was icnreased from her home dose of . Will continue to monitor for symptom improvement. IT is unclear if she has been compliant with her medications at home, so will need to ensure 14 day oral trial prior to administering Maintenna.  -Patient appears to have some mild cognitive impairment, also does not appear to have the capacity to make medical decision due to her current limited mentation.  Will recommend identifying a legal guardian in addition to possible skilled nursing facility to help with compliance of medication, improve quality of life, and assist with medical services. Disposition: Recommend psychiatric Inpatient admission when medically cleared.  Maryagnes Amos, FNP 02/29/2020 12:05 PM

## 2020-02-29 NOTE — TOC Initial Note (Addendum)
Transition of Care [facility]) - Initial/Assessment Note    Patient Details  Name: Jessica Campbell MRN: [id number] Date of Birth: May 11, 1955  Transition of Care [facility]) CM/SW Contact:    Carmina Miller, LCSWA Phone Number: 02/29/2020, 3:56 PM  Clinical Narrative:                 CSW reached out to Raymond ([phone]) at [facility], requested geri-psych bed, Corrie Dandy stated there are geri-psych beds available. CSW faxed clinicals to [phone]. Corrie Dandy stated that if pt is unable to sign herself in and pt doesn't have a POA/guardian then pt will have to be IVC'd. CSW will continue to follow.         Patient Goals and CMS Choice        Expected Discharge Plan and Services                                                Prior Living Arrangements/Services                       Activities of Daily Living      Permission Sought/Granted                  Emotional Assessment              Admission diagnosis:  Hallucinations [R44.3] AKI (acute kidney injury) (HCC) [N17.9] Acute kidney injury (HCC) [N17.9] Patient Active Problem List   Diagnosis Date Noted  . AKI (acute kidney injury) (HCC) 02/28/2020  . DM (diabetes mellitus), type 2 (HCC) 02/28/2020  . HTN (hypertension), benign 02/28/2020  . Paranoid schizophrenia, chronic condition with acute exacerbation (HCC) 02/28/2020  . HLD (hyperlipidemia) 02/28/2020  . Acute kidney injury (HCC) 02/28/2020  . Hallucinations 02/28/2020   PCP:  System, Provider Not In Pharmacy:   [id number] - [place], [place] - [address] AT [facility] [address] [place] [address] Phone: [phone] Fax: [phone]     Social Determinants of Health (SDOH) Interventions    Readmission Risk Interventions No flowsheet data found.

## 2020-03-01 DIAGNOSIS — T443X5A Adverse effect of other parasympatholytics [anticholinergics and antimuscarinics] and spasmolytics, initial encounter: Secondary | ICD-10-CM

## 2020-03-01 DIAGNOSIS — G2570 Drug induced movement disorder, unspecified: Secondary | ICD-10-CM | POA: Diagnosis not present

## 2020-03-01 DIAGNOSIS — E1122 Type 2 diabetes mellitus with diabetic chronic kidney disease: Secondary | ICD-10-CM

## 2020-03-01 DIAGNOSIS — N179 Acute kidney failure, unspecified: Secondary | ICD-10-CM | POA: Diagnosis not present

## 2020-03-01 DIAGNOSIS — F200 Paranoid schizophrenia: Secondary | ICD-10-CM | POA: Diagnosis not present

## 2020-03-01 DIAGNOSIS — N1831 Chronic kidney disease, stage 3a: Secondary | ICD-10-CM

## 2020-03-01 LAB — GLUCOSE, CAPILLARY
Glucose-Capillary: 83 mg/dL (ref 70–99)
Glucose-Capillary: 86 mg/dL (ref 70–99)
Glucose-Capillary: 95 mg/dL (ref 70–99)
Glucose-Capillary: 100 mg/dL — ABNORMAL HIGH (ref 70–99)

## 2020-03-01 NOTE — Progress Notes (Addendum)
   Subjective: HD#2 No acute overnight events.  Patient evaluated  at bedside this morning.  States she is doing fine this morning. Understands came to hospital as was not breathing well but feels.  better now. Denies suicidal ideations, homicidal ideations or auditory/ visual hallucinations.  Discussion about going to psychiatry inpatient unit and consents voluntarily to go there and get her medications " right" before going back home.  Objective:  Vital signs in last 24 hours: Vitals:   02/29/20 0935 02/29/20 1500 02/29/20 2014 03/01/20 0646  BP: 136/68 132/74 (!) 112/39 (!) 144/69  Pulse: 67 67 63 (!) 53  Resp: 18 18 18 18   Temp: 97.8 F (36.6 C) 98.6 F (37 C) 98 F (36.7 C) 98.5 F (36.9 C)  TempSrc:   Oral Oral  SpO2: 97% 98% 95% 96%  Weight:       Physical exam General: Well developed, well nourished, sitting comfortably in bed, NAD HEENT: [place]/NT, Hearing intact, MMM Neck: Supple, ROM intact Neuro; AAO*3 Psych: Thought blocking +, unclear AVH.   Assessment/Plan: Jessica Robinsonis a 64 y.o.with PMH of schizoaffective disorder with paranoia, HTN, HLD, DM, Depressionadmitted for acute kidney injury and visual Hallucinations.  Active Problems:   AKI (acute kidney injury) (HCC)   DM (diabetes mellitus), type 2 (HCC)   HTN (hypertension), benign   Paranoid schizophrenia, chronic condition with acute exacerbation (HCC)   HLD (hyperlipidemia)   Acute kidney injury (HCC)   Hallucinations  # Schizoaffective disorder Patient denies auditory or visual hallucinations today but apparent though blocking. Unclear AVH at this point. Denies suicidal or homicidal ideations. Psych consult also suggests inpatient psych hospitalization. Patient verbally consents to go to inpatient psych. SW sent out her paper work to 64 and most probable disposition tomorrow.  - Greatly appreciate the psych consult - Agitation protocol in action  - C/W Abilify 15 mg - Zoloft 50 mg Q  day - Trazodone 300 mg for sleep  Drug-induced extrapyramidal movement disorder She noted to be tremulous on examination today. Attributed to extrapyramidal movements related to anti-psychotics. Currently on Cogentin and amantadine  - C/w home meds: amantadine 100mg  BID, bentropine 1mg  am, 0.5mg  pm  Acute Kidney Injuryon Chronic Kidney Disease stage 3a Her renal functions are at baseline. Had acute kidney injury possibly due to poor eating and drinking as lives by herself.  U/A shows moderate leukocytes and rare bacteria. U/S renal does not show any evidence of obstructive uropathy.   - Avoid nephrotoxic meds when able  Prior to Admission Living Arrangement: Home Anticipated Discharge Location: Inpatient psychiatry Barriers to Discharge: Disposition Dispo: Anticipated discharge in approximately 1-2 day(s).   [employer], MD 03/01/2020, 9:38 AM Pager: [phone] After 5pm on weekdays and 1pm on weekends: On Call pager [phone]

## 2020-03-02 LAB — CALCIUM / CREATININE RATIO, URINE
Calcium, Ur: 0.8 mg/dL
Calcium/Creat.Ratio: 7 mg/g creat — ABNORMAL LOW (ref 29–442)
Creatinine, Urine: 111.6 mg/dL

## 2020-03-02 LAB — GLUCOSE, CAPILLARY
Glucose-Capillary: 85 mg/dL (ref 70–99)
Glucose-Capillary: 122 mg/dL — ABNORMAL HIGH (ref 70–99)
Glucose-Capillary: 114 mg/dL — ABNORMAL HIGH (ref 70–99)

## 2020-03-02 NOTE — Progress Notes (Signed)
Occupational Therapy Evaluation Patient Details Name: Jessica Campbell MRN: [id number] DOB: 1956-02-01 Today's Date: 03/02/2020    History of Present Illness Jessica Campbell is a 64 y.o. with PMH of schizoaffective disorder with paranoia, HTN, HLD, DM, depression admitted for hallucinations and durg induced extrapyramidal movement disorder.   Clinical Impression   PTA patient was living with her sister and was requiring supervision A grossly for ADLs/IADLs including bathing/dressing and toileting. Patient with cognitive deficits at baseline with sister reporting that she often isn't able to keep up with the date. On evaluation, patient oriented to person and place only with inability to recall date or situation. Patient's sister at bedside states this is baseline. Patient currently presents near baseline level of function demonstrating supervision A for bed mobility with increased time, Min guard for sit to stand transfers, and Min guard for completion of grooming tasks standing at sink level. Noted tremors during task which sister reports is baseline. Patient would benefit from continued acute OT services to maximize safety and independence with self-care tasks in prep for d/c to next level of care. No follow-up OT needs post d/c at this time.     Follow Up Recommendations  No OT follow up    Equipment Recommendations  None recommended by OT    Recommendations for Other Services       Precautions / Restrictions Precautions Precautions: Fall Restrictions Weight Bearing Restrictions: No      Mobility Bed Mobility Overal bed mobility: Needs Assistance Bed Mobility: Supine to Sit;Sit to Supine     Supine to sit: Supervision Sit to supine: Supervision        Transfers Overall transfer level: Needs assistance Equipment used: 1 person hand held assist Transfers: Sit to/from Stand Sit to Stand: Min guard         General transfer comment: Min guard for balance.     Balance  Overall balance assessment: Needs assistance Sitting-balance support: Feet supported Sitting balance-Leahy Scale: Good     Standing balance support: Single extremity supported;During functional activity Standing balance-Leahy Scale: Poor Standing balance comment: reliant on at least single UE support                           ADL either performed or assessed with clinical judgement   ADL Overall ADL's : Needs assistance/impaired Eating/Feeding: Set up;Sitting Eating/Feeding Details (indicate cue type and reason): Patient able to bring cup to mouth and take sips. Bilateral UE tremors noted.  Grooming: Wash/dry hands;Wash/dry face;Oral care;Min guard Grooming Details (indicate cue type and reason): Patient completed 3/3 grooming tasks standing at sink level with Min gaurd for safety.              Lower Body Dressing: Min guard;Sit to/from stand Lower Body Dressing Details (indicate cue type and reason): Patient able to don footwear seated EOB with use of                      Vision Baseline Vision/History: Wears glasses Wears Glasses: Reading only Patient Visual Report: No change from baseline Vision Assessment?: No apparent visual deficits     Perception     Praxis      Pertinent Vitals/Pain Pain Assessment: No/denies pain     Hand Dominance Right   Extremity/Trunk Assessment Upper Extremity Assessment Upper Extremity Assessment: RUE deficits/detail;LUE deficits/detail RUE:  (Patient with chronic BUE tremors. ) RUE Coordination: decreased fine motor;decreased gross motor LUE Coordination: decreased fine motor;decreased  gross motor   Lower Extremity Assessment Lower Extremity Assessment: Defer to PT evaluation   Cervical / Trunk Assessment Cervical / Trunk Assessment: Normal   Communication Communication Communication: No difficulties   Cognition Arousal/Alertness: Awake/alert Behavior During Therapy:  (Lethargic) Overall Cognitive Status:  History of cognitive impairments - at baseline Area of Impairment: Orientation;Attention;Memory;Awareness                               General Comments: Pt with psych history, follows all commands, decreased awareness of safety/deficits. Pt denying current hallucinations but reports she typically sees a man with a child playing with a ball. Pt stating she fell off the [facility] today in the room but RN states this is not true.   General Comments  Sister present at bedside.     Exercises     Shoulder Instructions      Home Living Family/patient expects to be discharged to:: Private residence Living Arrangements: Other relatives (Sister) Available Help at Discharge: Family;Available 24 hours/day (sister) Type of Home: House Home Access: Stairs to enter [employer] of Steps: 3 Entrance Stairs-Rails: None Home Layout: Two level Alternate Level Stairs-Number of Steps:  (flight) Alternate Level Stairs-Rails: Can reach both Bathroom Shower/Tub: Chief Strategy Officer: Standard Bathroom Accessibility: Yes   Home Equipment: None          Prior Functioning/Environment Level of Independence: Needs assistance  Gait / Transfers Assistance Needed: Sister provides supervision A for bathing/dressing/toileing. Reports several falls in past month, once down the steps from second floor and once out of bed. [facility]es to [facility]x [facility].  ADL's / Homemaking Assistance Needed: independent ADL's, sister assists with meals intermittently             OT Problem List: Impaired balance (sitting and/or standing);Decreased cognition;Decreased safety awareness      OT Treatment/Interventions: Self-care/ADL training;Therapeutic exercise;Cognitive remediation/compensation;Patient/family education;Balance training    OT Goals(Current goals can be found in the care plan section) Acute Rehab OT Goals Patient Stated Goal: No goals stated OT Goal Formulation: With  patient/family Time For Goal Achievement: 03/16/20 Potential to Achieve Goals: Good ADL Goals Pt Will Perform Lower Body Dressing: with modified independence;sit to/from stand Pt Will Perform Toileting - Clothing Manipulation and hygiene: with modified independence;sit to/from stand Pt Will Perform Tub/Shower Transfer: Tub transfer;shower seat;grab bars Additional ADL Goal #1: Patient will demonstrate ability for day to day recall/carry over during activities of daily living with supervision A.  OT Frequency: Min 1X/week   Barriers to D/C:            Co-evaluation              AM-PAC OT "6 Clicks" Daily Activity     Outcome Measure Help from another person eating meals?: None Help from another person taking care of personal grooming?: A Little Help from another person toileting, which includes using toliet, bedpan, or urinal?: A Little Help from another person bathing (including washing, rinsing, drying)?: A Little Help from another person to put on and taking off regular upper body clothing?: A Little Help from another person to put on and taking off regular lower body clothing?: A Little 6 Click Score: 19   End of Session Equipment Utilized During Treatment: Gait belt Nurse Communication: Mobility status  Activity Tolerance: Patient tolerated treatment well Patient left: in bed;with call bell/phone within reach;with bed alarm set;with family/visitor present;with nursing/sitter in room  OT Visit  Diagnosis: Unsteadiness on feet (R26.81);History of falling (Z91.81)                Time: 3354-5625 OT Time Calculation (min): 28 min Charges:  OT General Charges $OT Visit: 1 Visit OT Evaluation $OT Eval Moderate Complexity: 1 Mod OT Treatments $Self Care/Home Management : 8-22 mins  Uma Jerde H. OTR/L Supplemental OT, Department of rehab services [phone]  Rex Oesterle R H. 03/02/2020, 1:59 PM

## 2020-03-02 NOTE — Progress Notes (Signed)
   Subjective: HD#3  No acute overnight events.   Patient evaluated  at bedside this morning. She is resting comfortably in bed. She notes sleeping well overnight. No further visual hallucinations since yesterday and notes improvement in auditory hallucinations. No acute concerns at this time. Patient still agreeable with discharge to geriatric psych once bed is available.   Objective:  Vital signs in last 24 hours: Vitals:   03/01/20 1003 03/01/20 1620 03/01/20 2156 03/02/20 0453  BP: 137/60 (!) 142/67 (!) 146/82 124/89  Pulse: 60 64 60 72  Resp:  18 18 18   Temp: 98.6 F (37 C) 98.6 F (37 C) 98 F (36.7 C) 98 F (36.7 C)  TempSrc: Oral Oral  Oral  SpO2: 98% 98% 96% 98%  Weight:       Physical exam General: Well developed, well nourished, sitting comfortably in bed, NAD HEENT: [place]/NT, Hearing intact, MMM Neck: Supple, ROM intact Neuro; AAO*3 Psych: pleasant mood; denies visual hallucinations; auditory hallucinations improved; no suicidal/homicidal ideation  Assessment/Plan: Jessica Robinsonis a 64 y.o.with PMH of schizoaffective disorder with paranoia, HTN, HLD, DM, Depressionadmitted for acute kidney injury and visual Hallucinations.  Active Problems:   AKI (acute kidney injury) (HCC)   DM (diabetes mellitus), type 2 (HCC)   HTN (hypertension), benign   Paranoid schizophrenia, chronic condition with acute exacerbation (HCC)   HLD (hyperlipidemia)   Acute kidney injury (HCC)   Hallucinations  Schizoaffective disorder Patient denies any further visual hallucinations and improvement in her auditory hallucinations. Denies any suicidal or homicidal ideation. Per psychiatry recommendations, patient is for inpatient geriatric psychiatry hospitalization for which is agreeable.  - Awaiting geriatric psychiatry placement  - Continue agitation protocol - Continue Abilify 15mg  daily - Continue Zoloft 50mg  daily - Continue Trazodone 300mg  qHS   Drug-induced extrapyramidal  movement disorder Continued EPS symptoms. Currently on Cogentin and amantadine - C/w home meds: amantadine 100mg  BID, bentropine 1mg  am, 0.5mg  pm  Acute Kidney Injuryon Chronic Kidney Disease stage 3a - resolved Patient presented with AKI in setting of decreased oral intake. No significant findings on renal ultrasound or UA. This improved with fluid resuscitation and patient has had good oral intake since admission.  - Avoid nephrotoxic meds when able  Prior to Admission Living Arrangement: Home Anticipated Discharge Location: Inpatient psychiatry Barriers to Discharge: Disposition Dispo: Anticipated discharge in approximately 3-4 day(s).   63, MD  IMTS PGY-2 03/02/2020, 6:58 AM Pager: [phone] After 5pm on weekdays and 1pm on weekends: On Call pager [phone]

## 2020-03-02 NOTE — TOC Progression Note (Signed)
Transition of Care [facility]) - Progression Note    Patient Details  Name: Jessica Campbell MRN: [id number] Date of Birth: 1955/07/28  [facility]) CM/SW Contact  Carmina Miller, LCSWA Phone Number: 03/02/2020, 10:30 AM  Clinical Narrative:    CSW called and spoke with admissions rep at [facility] for geri-psych placement, was advised pt's referral had not yet been reviewed, stated they are behind in reviewing incoming referrals but would try and get to it today. CSW will continue to follow.         Expected Discharge Plan and Services                                                 Social Determinants of Health (SDOH) Interventions    Readmission Risk Interventions No flowsheet data found.

## 2020-03-02 NOTE — Progress Notes (Signed)
  Date: 03/02/2020  Patient name: Jessica Campbell  Medical record number: [id number]  Date of birth: 09/01/1955   This patient's plan of care was discussed with the house staff. Please see Dr. Joretta Bachelor note for complete details. I concur with their findings.   Inez Catalina, MD 03/02/2020, 7:30 PM

## 2020-03-03 DIAGNOSIS — F200 Paranoid schizophrenia: Secondary | ICD-10-CM | POA: Diagnosis not present

## 2020-03-03 DIAGNOSIS — N1831 Chronic kidney disease, stage 3a: Secondary | ICD-10-CM | POA: Diagnosis not present

## 2020-03-03 DIAGNOSIS — I129 Hypertensive chronic kidney disease with stage 1 through stage 4 chronic kidney disease, or unspecified chronic kidney disease: Secondary | ICD-10-CM

## 2020-03-03 DIAGNOSIS — N179 Acute kidney failure, unspecified: Secondary | ICD-10-CM | POA: Diagnosis not present

## 2020-03-03 LAB — GLUCOSE, CAPILLARY
Glucose-Capillary: 96 mg/dL (ref 70–99)
Glucose-Capillary: 94 mg/dL (ref 70–99)
Glucose-Capillary: 106 mg/dL — ABNORMAL HIGH (ref 70–99)
Glucose-Capillary: 135 mg/dL — ABNORMAL HIGH (ref 70–99)

## 2020-03-03 NOTE — Progress Notes (Signed)
   Subjective: HD#4 No acute overnight events.   Patient evaluated  at bedside this morning. Patient doing well this morning. States she slept well overnight and was able to eat this AM. Denies visual or auditory hallucinations at this time.   Objective:  Vital signs in last 24 hours: Vitals:   03/02/20 0958 03/02/20 1626 03/02/20 2102 03/03/20 0528  BP: 125/69 113/62 (!) 118/58 (!) 136/59  Pulse: (!) 55 (!) 52 (!) 49 (!) 47  Resp: 18 18 20 18   Temp: 98 F (36.7 C) 98.3 F (36.8 C) 98 F (36.7 C) 97.8 F (36.6 C)  TempSrc: Oral  Oral Oral  SpO2: 100% 96% 97% 94%  Weight:    67.6 kg   Physical exam General: Well developed, well nourished, sitting comfortably in bed, NAD HEENT: [place]/AT, Hearing intact, EOMI, MMM Neck: Supple, ROM intact Neuro; AAO*3 Psych: pleasant mood; denies visual or auditory hallucinations; no suicidal/homicidal ideation  Assessment/Plan: Jessica Robinsonis a 64 y.o.with PMH of schizoaffective disorder with paranoia, HTN, HLD, DM, Depressionadmitted for acute kidney injury and visual Hallucinations.  Active Problems:   AKI (acute kidney injury) (HCC)   DM (diabetes mellitus), type 2 (HCC)   HTN (hypertension), benign   Paranoid schizophrenia, chronic condition with acute exacerbation (HCC)   HLD (hyperlipidemia)   Acute kidney injury (HCC)   Hallucinations  Schizoaffective disorder Patient denies any further visual or auditory hallucinations. Denies any suicidal or homicidal ideation. Per psychiatry recommendations, patient is for inpatient geriatric psychiatry hospitalization for which is agreeable.  - Awaiting geriatric psychiatry placement  - Continue agitation protocol - Continue Abilify 15mg  daily - Continue Zoloft 50mg  daily - Continue Trazodone 300mg  qHS   Drug-induced extrapyramidal movement disorder Continued EPS symptoms. Currently on Cogentin and amantadine - C/w home meds: amantadine 100mg  BID, bentropine 1mg  am, 0.5mg  pm  Acute  Kidney Injuryon Chronic Kidney Disease stage 3a - resolved Patient presented with AKI in setting of decreased oral intake. No significant findings on renal ultrasound or UA. This improved with fluid resuscitation and patient has had good oral intake since admission.  - Avoid nephrotoxic meds when able - BMP in AM  Prior to Admission Living Arrangement: Home Anticipated Discharge Location: Inpatient psychiatry Barriers to Discharge: Disposition Dispo: Anticipated discharge in approximately 3-4 day(s).   65, MD  [facility]TS PGY-2 03/03/2020, 6:46 AM Pager: [phone] After 5pm on weekdays and 1pm on weekends: On Call pager [phone]

## 2020-03-03 NOTE — Progress Notes (Signed)
  Date: 03/03/2020  Patient name: Jessica Campbell  Medical record number: [id number]  Date of birth: [birth year]   I have seen and evaluated this patient and I have discussed the plan of care with the house staff. Please see their note for complete details.  Jessy Oto, M.D., Ph.D. 03/03/2020, 2:18 PM

## 2020-03-04 LAB — GLUCOSE, CAPILLARY
Glucose-Capillary: 94 mg/dL (ref 70–99)
Glucose-Capillary: 101 mg/dL — ABNORMAL HIGH (ref 70–99)
Glucose-Capillary: 98 mg/dL (ref 70–99)
Glucose-Capillary: 107 mg/dL — ABNORMAL HIGH (ref 70–99)
Glucose-Capillary: 110 mg/dL — ABNORMAL HIGH (ref 70–99)

## 2020-03-04 LAB — BASIC METABOLIC PANEL
Anion gap: 11 (ref 5–15)
BUN: 11 mg/dL (ref 8–23)
CO2: 22 mmol/L (ref 22–32)
Calcium: 9.3 mg/dL (ref 8.9–10.3)
Chloride: 106 mmol/L (ref 98–111)
Creatinine, Ser: 1.47 mg/dL — ABNORMAL HIGH (ref 0.44–1.00)
GFR, Estimated: 40 mL/min — ABNORMAL LOW (ref >60)
Glucose, Bld: 108 mg/dL — ABNORMAL HIGH (ref 70–99)
Potassium: 4 mmol/L (ref 3.5–5.1)
Sodium: 139 mmol/L (ref 135–145)

## 2020-03-04 MED ORDER — LACTATED RINGERS IV BOLUS
1000.0000 mL | Freq: Once | INTRAVENOUS | Status: AC
  Administered 2020-03-04: 1000 mL via INTRAVENOUS

## 2020-03-04 NOTE — Progress Notes (Signed)
Subjective: HD#5 No acute overnight events. Evaluated at bedside this morning. She denies any suicidal or homicidal ideation. Continues to endorse some auditory hallucinations that are repeatedly saying "who is she". Unable to clarify whether these are internal or external. Also has visual hallucinations - "see something crawling". She says that her mother is still around and comes and goes. Last time she saw her was last night. No current auditory hallucinations  Endorses that she has total seven siblings but notes only 3 sisters and 1 brother living currently.   Objective:  Vital signs in last 24 hours: Vitals:   03/03/20 1654 03/03/20 2056 03/04/20 0456 03/04/20 0922  BP: 95/80 (!) 122/55 125/65 125/60  Pulse: (!) 55 (!) 47 (!) 52 (!) 51  Resp: 18 18 18 18   Temp: 97.9 F (36.6 C) 98.1 F (36.7 C) 98 F (36.7 C) 98.1 F (36.7 C)  TempSrc: Oral Oral Oral Oral  SpO2: 95% 96% 98% 96%  Weight:       CBC Latest Ref Rng & Units 02/29/2020 02/27/2020  WBC 4.0 - 10.5 K/uL 6.4 6.8  Hemoglobin 12.0 - 15.0 g/dL 02/29/2020) 5.6(E)  Hematocrit 36 - 46 % 26.8(L) 32.1(L)  Platelets 150 - 400 K/uL 223 276   BMP Latest Ref Rng & Units 03/04/2020 02/29/2020 02/28/2020  Glucose 70 - 99 mg/dL 03/01/2020) 98 951(O)  BUN 8 - 23 mg/dL 11 16 21   Creatinine 0.44 - 1.00 mg/dL 841(Y) ) 6.06(T)  Sodium 135 - 145 mmol/L 139 142 140  Potassium 3.5 - 5.1 mmol/L 4.0 3.7 3.7  Chloride 98 - 111 mmol/L 106 112(H) 107  CO2 22 - 32 mmol/L 22 20(L) 22  Calcium 8.9 - 10.3 mg/dL 9.3 9.1 9.3   Physical exam General: Well developed, well nourished, sitting comfortably in bed, NAD HEENT: /AT, Hearing intact, EOMI, MMM Neck: Supple, ROM intact Neuro: AAO*3 Psych: Pleasant mood;Admits to visual and auditory hallucinations; no suicidal/homicidal ideation, Thought blocking +.   Assessment/Plan: Jessica Robinsonis a 64 y.o.with PMH of schizoaffective disorder with paranoia, HTN, HLD, DM, Depressionadmitted for  acute kidney injury and visual Hallucinations.   Active Problems:   AKI (acute kidney injury) (HCC)   DM (diabetes mellitus), type 2 (HCC)   HTN (hypertension), benign   Paranoid schizophrenia, chronic condition with acute exacerbation (HCC)   HLD (hyperlipidemia)   Acute kidney injury (HCC)   Hallucinations  Schizoaffective disorder Patient endorses visual and auditory hallucinations. States has to turn her head to hear the voices.  Denies any suicidal or homicidal ideation. Per psychiatry recommendations, patient is for inpatient geriatric psychiatry hospitalization for which is agreeable.   - Awaiting geriatric psychiatry placement  - Continue agitation protocol - Continue Abilify 15mg  daily - Continue Zoloft 50mg  daily - Continue Trazodone 300mg  qHS   Drug-induced extrapyramidal movement disorder Continued EPS symptoms. Currently on Cogentin and amantadine - C/w home meds: amantadine 100mg  BID, bentropine 1mg  am, 0.5mg  pm  Acute Kidney Injuryon Chronic Kidney Disease stage  Patient's creatinine is 1.47 this morning, possibly due to inadequate eating & drinking. Denies eating her breakfast. Counseled about balanced diet and shows good understanding. No significant findings on renal ultrasound or UA.   - IV LR bolus 1,000 ml - Avoid nephrotoxic meds when able - BMP in AM  Prior to Admission Living Arrangement: Home Anticipated Discharge Location: Inpatient psychiatry Barriers to Discharge: Disposition Dispo: Anticipated discharge in approximately 2-3 day(s).   1.09(N, MD 03/04/2020, 1:23 PM Pager: [phone] After 5pm on weekdays and  1pm on weekends: On Call pager [phone]

## 2020-03-05 DIAGNOSIS — F200 Paranoid schizophrenia: Secondary | ICD-10-CM | POA: Diagnosis not present

## 2020-03-05 DIAGNOSIS — R443 Hallucinations, unspecified: Secondary | ICD-10-CM

## 2020-03-05 DIAGNOSIS — N179 Acute kidney failure, unspecified: Secondary | ICD-10-CM | POA: Diagnosis not present

## 2020-03-05 LAB — BASIC METABOLIC PANEL
Anion gap: 11 (ref 5–15)
BUN: 11 mg/dL (ref 8–23)
CO2: 25 mmol/L (ref 22–32)
Calcium: 9.4 mg/dL (ref 8.9–10.3)
Chloride: 104 mmol/L (ref 98–111)
Creatinine, Ser: 1.53 mg/dL — ABNORMAL HIGH (ref 0.44–1.00)
GFR, Estimated: 38 mL/min — ABNORMAL LOW (ref >60)
Glucose, Bld: 119 mg/dL — ABNORMAL HIGH (ref 70–99)
Potassium: 3.6 mmol/L (ref 3.5–5.1)
Sodium: 140 mmol/L (ref 135–145)

## 2020-03-05 LAB — GLUCOSE, CAPILLARY
Glucose-Capillary: 104 mg/dL — ABNORMAL HIGH (ref 70–99)
Glucose-Capillary: 98 mg/dL (ref 70–99)
Glucose-Capillary: 115 mg/dL — ABNORMAL HIGH (ref 70–99)
Glucose-Capillary: 105 mg/dL — ABNORMAL HIGH (ref 70–99)

## 2020-03-05 MED ORDER — OLANZAPINE 5 MG PO TBDP
2.5000 mg | ORAL_TABLET | Freq: Every day | ORAL | Status: DC
  Administered 2020-03-05 – 2020-03-10 (×6): 2.5 mg via ORAL
  Filled 2020-03-05 (×6): qty 0.5

## 2020-03-05 MED ORDER — HALOPERIDOL 5 MG PO TABS
5.0000 mg | ORAL_TABLET | Freq: Four times a day (QID) | ORAL | Status: DC | PRN
  Administered 2020-03-07 – 2020-03-11 (×2): 5 mg via ORAL
  Filled 2020-03-05 (×3): qty 1

## 2020-03-05 MED ORDER — BENZTROPINE MESYLATE 1 MG PO TABS
1.0000 mg | ORAL_TABLET | Freq: Four times a day (QID) | ORAL | Status: DC | PRN
  Administered 2020-03-11: 1 mg via ORAL
  Filled 2020-03-05 (×2): qty 1

## 2020-03-05 MED ORDER — ARIPIPRAZOLE 5 MG PO TABS
15.0000 mg | ORAL_TABLET | Freq: Every day | ORAL | Status: DC
  Administered 2020-03-06: 15 mg via ORAL
  Filled 2020-03-05: qty 1

## 2020-03-05 MED ORDER — HALOPERIDOL LACTATE 5 MG/ML IJ SOLN
2.0000 mg | Freq: Once | INTRAMUSCULAR | Status: AC
  Administered 2020-03-05: 2 mg via INTRAMUSCULAR
  Filled 2020-03-05: qty 1

## 2020-03-05 NOTE — Progress Notes (Signed)
Physical Therapy Treatment Patient Details Name: Jessica Campbell MRN: [id number] DOB: [birth year] Today's Date: 03/05/2020    History of Present Illness Jessica Campbell is a 64 y.o. with PMH of schizoaffective disorder with paranoia, HTN, HLD, DM, depression admitted for hallucinations and durg induced extrapyramidal movement disorder.    PT Comments    Patient with limited progress needing increased assistance for transfers and ambulation this session possibly due to lack of sleep per NT.  Feel she needed same amount of help using the walker as not and needing help to safely manage walker.  She will need inpatient psychiatric stay and hopeful for follow up PT in that facility as well.  Will continue to follow acutely.    Follow Up Recommendations  Other (comment) (Geriatric inpatient psychiatric hospital)     Equipment Recommendations  Rolling walker with 5" wheels    Recommendations for Other Services       Precautions / Restrictions Precautions Precautions: Fall    Mobility  Bed Mobility Overal bed mobility: Needs Assistance Bed Mobility: Supine to Sit;Sit to Supine     Supine to sit: Supervision Sit to supine: Supervision      Transfers Overall transfer level: Needs assistance Equipment used: Rolling walker (2 wheeled);None Transfers: Sit to/from Stand Sit to Stand: Min assist;Mod assist         General transfer comment: initially mod A due to posterior LOB initially standing; practice with cues and demonstration, improved to min A for balance  Ambulation/Gait Ambulation/Gait assistance: Min assist;Mod assist Gait Distance (Feet): 150 Feet Assistive device: Rolling walker (2 wheeled) Gait Pattern/deviations: Step-through pattern;Decreased stride length;Drifts right/left     General Gait Details: Initially with RW, pt leaning R and with difficulty maintaining close proximity and keeping L hand on walker so after 80' tried with no device and able to balance  better with same amount of assistance   Stairs             Wheelchair Mobility    Modified Rankin (Stroke Patients Only)       Balance Overall balance assessment: Needs assistance Sitting-balance support: Feet supported Sitting balance-Leahy Scale: Fair Sitting balance - Comments: supervision for safety at EOB   Standing balance support: No upper extremity supported;Bilateral upper extremity supported Standing balance-Leahy Scale: Poor Standing balance comment: min A for balance static standing without UE support                            Cognition Arousal/Alertness: Awake/alert Behavior During Therapy: WFL for tasks assessed/performed Overall Cognitive Status: Impaired/Different from baseline Area of Impairment: Orientation;Attention;Memory;Awareness;Following commands                 Orientation Level: Disoriented to;Situation;Time Current Attention Level: Sustained Memory: Decreased short-term memory;Decreased recall of precautions Following Commands: Follows one step commands consistently;Follows one step commands with increased time       General Comments: dysarthric and difficult to understand at times; has difficulty staying focused needing frequent cues for attention to balance, walker use, etc; some L side inattention noted as well      Exercises      General Comments General comments (skin integrity, edema, etc.): sitter present in the room      Pertinent Vitals/Pain Pain Assessment: No/denies pain    Home Living                      Prior Function  PT Goals (current goals can now be found in the care plan section) Progress towards PT goals: Not progressing toward goals - comment    Frequency    Min 2X/week      PT Plan Discharge plan needs to be updated    Co-evaluation              AM-PAC PT "6 Clicks" Mobility   Outcome Measure  Help needed turning from your back to your side while  in a flat bed without using bedrails?: A Little Help needed moving from lying on your back to sitting on the side of a flat bed without using bedrails?: A Little Help needed moving to and from a bed to a chair (including a wheelchair)?: A Lot Help needed standing up from a chair using your arms (e.g., wheelchair or bedside chair)?: A Little Help needed to walk in hospital room?: A Little Help needed climbing 3-5 steps with a railing? : A Lot 6 Click Score: 16    End of Session Equipment Utilized During Treatment: Gait belt Activity Tolerance: Patient tolerated treatment well Patient left: in bed;with call bell/phone within reach;with nursing/sitter in room   PT Visit Diagnosis: Unsteadiness on feet (R26.81);History of falling (Z91.81);Other abnormalities of gait and mobility (R26.89)     Time: 1435-1500 PT Time Calculation (min) (ACUTE ONLY): 25 min  Charges:  $Gait Training: 8-22 mins $Therapeutic Activity: 8-22 mins                     Sheran Lawless, PT Acute Rehabilitation Services Pager:[phone] Office:[phone] 03/05/2020    Jessica Campbell 03/05/2020, 5:50 PM

## 2020-03-05 NOTE — Progress Notes (Signed)
Subjective: HD#6 Patient was agitated and received Zyprexa 10 mg & Haldol 2 mg.  States she feels better this morning and does not remember pulling out her IV last night. During evaluation looking for her mother under the bed and states she was in the room this morning. Endorses auditory hallucinations, saying random things. Does not remember what she ate for her breakfast.  Sister [name] was at bedside. Had discussion about patient's reason for admission and explained possibly due to decreased oral intake and sister confirms that patient had poor intake before hospitalization. Also, had discussion about ongoing placement situation and that she will be updated.  Of note: SW are working actively to find her placement. [facility] SW was also called and asked for help regarding her placement but they also think [facility] SW will be able to help better. Objective:  Vital signs in last 24 hours: Vitals:   03/04/20 1623 03/04/20 2014 03/05/20 0403 03/05/20 0708  BP: 107/82 (!) 123/53 (!) 145/58 129/66  Pulse: (!) 49 (!) 55 (!) 43 (!) 49  Resp: 18 18 18 16   Temp: 98 F (36.7 C) 98 F (36.7 C) (!) 97.5 F (36.4 C) 98.2 F (36.8 C)  TempSrc:  Oral Oral Oral  SpO2: 99% 99% 94% 96%  Weight:       BMP Latest Ref Rng & Units 03/05/2020 03/04/2020 02/29/2020  Glucose 70 - 99 mg/dL 03/02/2020) 735(H) 98  BUN 8 - 23 mg/dL 11 11 16   Creatinine 0.44 - 1.00 mg/dL 299(M) ) 4.26(S)  Sodium 135 - 145 mmol/L 140 139 142  Potassium 3.5 - 5.1 mmol/L 3.6 4.0 3.7  Chloride 98 - 111 mmol/L 104 106 112(H)  CO2 22 - 32 mmol/L 25 22 20(L)  Calcium 8.9 - 10.3 mg/dL 9.4 9.3 9.1    Physical exam General: Well developed, well nourished, lying comfortably in bed, NAD HEENT: [place]/AT, Hearing intact,EOMI,MMM Neck: Supple, ROM intact Neuro: AAO*3 Psych: Seen responding to internal stimuli. Admits to Auditory and visual hallucinations. Denies command hallucinations, denies active suicidal ideations or homicidal  ideations.  Assessment/Plan:  Active Problems:   AKI (acute kidney injury) (HCC)   DM (diabetes mellitus), type 2 (HCC)   HTN (hypertension), benign   Paranoid schizophrenia, chronic condition with acute exacerbation (HCC)   HLD (hyperlipidemia)   Acute kidney injury (HCC)   Hallucinations  Schizoaffective disorder Patient is seen responding to the internal stimuli. Admits to auditory and visual hallucinations. Denies any suicidal or homicidal ideation. Per psychiatry recommendations, patient is for inpatient geriatric psychiatry hospitalization for which is agreeable. SW working hard to get her placement and called this morning [place] but they informed that they have not reached to her referral yet and it might take couple of weeks.  - Awaiting geriatric psychiatry placement  - Continue agitation protocol - Continue Abilify 15mg  daily - Continue Zoloft 50mg  daily - Continue Trazodone 300mg  qHS   Drug-induced extrapyramidal movement disorder Continued EPS symptoms. Currently on Cogentin and amantadine - C/w home meds: amantadine 100mg  BID, bentropine 1mg  am, 0.5mg  pm  Acute Kidney Injuryon Chronic Kidney Disease stage  Patient given IV fluid bolus yesterday to help with kidney function. She got agitated and pulled out her IV's last night. Patient's creatinine is 1.53 this morning. Denies eating her breakfast but nurse at bedside confirmed her eating breakfast. Counseled about balanced diet and shows good understanding. No significant findings on renal ultrasound or UA.   - Avoid nephrotoxic meds when able - BMP in AM  Prior to Admission Living Arrangement: Home Anticipated Discharge Location: Inpatient psychiatry Barriers to Discharge: Disposition Dispo: Pending Disposition.  Arnoldo Lenis, MD 03/05/2020, 12:05 PM Pager: [phone] After 5pm on weekdays and 1pm on weekends: On Call pager [phone]

## 2020-03-05 NOTE — Progress Notes (Signed)
   03/05/20 0125  Provider Notification  Provider Name/Title Dr Lurlean Nanny  Date Provider Notified 03/05/20  Time Provider Notified 0125  Notification Type Page  Notification Reason Change in status (increasing delirium)  Response See new orders (haldol 2 mg IM x 1 ordered)  Date of Provider Response 03/05/20  Time of Provider Response 0127

## 2020-03-06 DIAGNOSIS — R443 Hallucinations, unspecified: Secondary | ICD-10-CM | POA: Diagnosis not present

## 2020-03-06 DIAGNOSIS — F200 Paranoid schizophrenia: Secondary | ICD-10-CM | POA: Diagnosis not present

## 2020-03-06 DIAGNOSIS — N179 Acute kidney failure, unspecified: Secondary | ICD-10-CM | POA: Diagnosis not present

## 2020-03-06 LAB — BASIC METABOLIC PANEL
Anion gap: 12 (ref 5–15)
BUN: 15 mg/dL (ref 8–23)
CO2: 23 mmol/L (ref 22–32)
Calcium: 9.7 mg/dL (ref 8.9–10.3)
Chloride: 106 mmol/L (ref 98–111)
Creatinine, Ser: 1.52 mg/dL — ABNORMAL HIGH (ref 0.44–1.00)
GFR, Estimated: 38 mL/min — ABNORMAL LOW (ref >60)
Glucose, Bld: 92 mg/dL (ref 70–99)
Potassium: 4.4 mmol/L (ref 3.5–5.1)
Sodium: 141 mmol/L (ref 135–145)

## 2020-03-06 LAB — CBC
HCT: 30.6 % — ABNORMAL LOW (ref 36.0–46.0)
Hemoglobin: 9.4 g/dL — ABNORMAL LOW (ref 12.0–15.0)
MCH: 27.6 pg (ref 26.0–34.0)
MCHC: 30.7 g/dL (ref 30.0–36.0)
MCV: 90 fL (ref 80.0–100.0)
Platelets: 260 10*3/uL (ref 150–400)
RBC: 3.4 MIL/uL — ABNORMAL LOW (ref 3.87–5.11)
RDW: 14 % (ref 11.5–15.5)
WBC: 6 10*3/uL (ref 4.0–10.5)
nRBC: 0 % (ref 0.0–0.2)

## 2020-03-06 LAB — GLUCOSE, CAPILLARY
Glucose-Capillary: 81 mg/dL (ref 70–99)
Glucose-Capillary: 145 mg/dL — ABNORMAL HIGH (ref 70–99)
Glucose-Capillary: 102 mg/dL — ABNORMAL HIGH (ref 70–99)
Glucose-Capillary: 111 mg/dL — ABNORMAL HIGH (ref 70–99)

## 2020-03-06 MED ORDER — ARIPIPRAZOLE 5 MG PO TABS
10.0000 mg | ORAL_TABLET | Freq: Every day | ORAL | Status: DC
  Administered 2020-03-07 – 2020-03-14 (×8): 10 mg via ORAL
  Filled 2020-03-06 (×8): qty 2

## 2020-03-06 NOTE — Progress Notes (Signed)
Subjective: HD#7 No acute overnight events. Patient is evaluated at bedside this AM. States she feels better this morning. Notes auditory hallucinations " noises", can't understand the content of voices, but denies command hallucinations. Denies visual hallucinations and states her mother passed away few years ago. Denies any suicidal or homicidal ideations.  States ate her breakfast but it was pretty " nasty" and states slept well last night,even was difficult to get up for medications.  Objective:  Vital signs in last 24 hours: Vitals:   03/05/20 2052 03/06/20 0500 03/06/20 0751 03/06/20 1218  BP: (!) 141/81 (!) 123/52 (!) 112/56 (!) 108/54  Pulse: (!) 50 (!) 48 (!) 49 (!) 51  Resp: 16 16 18 20   Temp: 97.7 F (36.5 C) 97.6 F (36.4 C) 97.8 F (36.6 C) 98.4 F (36.9 C)  TempSrc: Oral Oral Oral Oral  SpO2: 99% 96% 97% 100%  Weight: 66.5 kg      CBC Latest Ref Rng & Units 03/06/2020 02/29/2020 02/27/2020  WBC 4.0 - 10.5 K/uL 6.0 6.4 6.8  Hemoglobin 12.0 - 15.0 g/dL 02/29/2020) 2.5(E) 5.2(D)  Hematocrit 36 - 46 % 30.6(L) 26.8(L) 32.1(L)  Platelets 150 - 400 K/uL 260 223 276   BMP Latest Ref Rng & Units 03/06/2020 03/05/2020 03/04/2020  Glucose 70 - 99 mg/dL 92 03/06/2020) 423(N)  BUN 8 - 23 mg/dL 15 11 11   Creatinine 0.44 - 1.00 mg/dL 361(W) ) 4.31(V)  Sodium 135 - 145 mmol/L 141 140 139  Potassium 3.5 - 5.1 mmol/L 4.4 3.6 4.0  Chloride 98 - 111 mmol/L 106 104 106  CO2 22 - 32 mmol/L 23 25 22   Calcium 8.9 - 10.3 mg/dL 9.7 9.4 9.3   Physical exam General: Well developed, well nourished, lying comfortably in bed, NAD HEENT: [place]/AT, Hearing intact,EOMI,MMM Neck: Supple, ROM intact Neuro:AAO*3 Psych: Admits to Auditory hallucinations, denies visual hallucinations. Denies command hallucinations, denies active suicidal ideations or homicidal ideations.   Assessment/Plan: Cherrelle Robinsonis a 64 y.o.with PMH of schizoaffective disorder with paranoia, HTN, HLD, DM,  Depressionadmitted for acute kidney injury and visual Hallucinations. Principal Problem:   Paranoid schizophrenia, chronic condition with acute exacerbation (HCC) Active Problems:   AKI (acute kidney injury) (HCC)   DM (diabetes mellitus), type 2 (HCC)   HTN (hypertension), benign   HLD (hyperlipidemia)   Acute kidney injury (HCC)   Hallucinations  Schizoaffective disorder Patient admits to auditory hallucinations " noises" but denies visual hallucinations today. Denies any suicidal or homicidal ideation. Per psychiatry recommendations, patient is for inpatient geriatric psychiatry hospitalization for which is agreeable. SW working hard to get her placement and sent referral to 6.76(P this morning.  - Awaiting geriatric psychiatry placement  - Greatly appreciate psych assistance - Continue agitation protocol - Continue Abilify 15mg  daily - Continue Zoloft 50mg  daily - Continue Trazodone 300mg  qHS  - Start Zyprexa 2.5 mg  Drug-induced extrapyramidal movement disorder Patient's TD is improving .Currently on Cogentin and amantadine. OT saw her today and does not recommend any follow up. - Appreciate OT assistance - C/w home meds: amantadine 100mg  BID, bentropine 1mg  am, 0.5mg  pm  Acute Kidney Injuryon Chronic Kidney Disease stage Patient's sCr~1.52 this morning, BUN 15. Hoping it to improve further with increased and improved oral intake.  - Avoid nephrotoxic meds when able - BMP in AM  Prior to Admission Living Arrangement:Home Anticipated Discharge Location:Inpatient psychiatry Barriers to Discharge:Disposition Dispo: Pending Disposition.   , MD 03/06/2020, 1:24 PM Pager: [phone] After 5pm on weekdays and 1pm  on weekends: On Call pager [phone]

## 2020-03-06 NOTE — TOC Progression Note (Addendum)
Transition of Care [facility]) - Progression Note    Patient Details  Name: Jessica Campbell MRN: [id number] Date of Birth: 1956/01/22  [facility] CM/SW Contact  Lorri Frederick, LCSW Phone Number: 03/06/2020, 9:17 AM  Clinical Narrative:  Pt referral sent to [facility]. [facility] [phone]   11:00: [facility] declines to admit pt.          Expected Discharge Plan and Services                                                 Social Determinants of Health (SDOH) Interventions    Readmission Risk Interventions No flowsheet data found.

## 2020-03-06 NOTE — Progress Notes (Signed)
Occupational Therapy Treatment Patient Details Name: Jessica Campbell MRN: [id number] DOB: 02-10-1956 Today's Date: 03/06/2020    History of present illness Jessica Campbell is a 64 y.o. with PMH of schizoaffective disorder with paranoia, HTN, HLD, DM, depression admitted for hallucinations and durg induced extrapyramidal movement disorder.   OT comments  Patient supine in bed and eager to participate in OT session. Completing LB dressing with min guard assist, transfers using RW with min guard, in room mobility with min guard using RW and grooming standing at sink with min guard assist. Requires cueing for task recall, attention and safety. Seated EOB with sitter present and eating lunch upon exit. Will follow.    Follow Up Recommendations  No OT follow up;Other (comment) (inpatient psych)    Equipment Recommendations  None recommended by OT    Recommendations for Other Services      Precautions / Restrictions Precautions Precautions: Fall Restrictions Weight Bearing Restrictions: No       Mobility Bed Mobility Overal bed mobility: Needs Assistance Bed Mobility: Supine to Sit     Supine to sit: Supervision     General bed mobility comments: for safety   Transfers Overall transfer level: Needs assistance Equipment used: Rolling walker (2 wheeled);None Transfers: Sit to/from Stand Sit to Stand: Min guard         General transfer comment: min guard for sit to stand, cueing for hand placement     Balance Overall balance assessment: Needs assistance Sitting-balance support: Feet supported Sitting balance-Leahy Scale: Fair Sitting balance - Comments: supervision for safety at EOB   Standing balance support: No upper extremity supported;Bilateral upper extremity supported;During functional activity Standing balance-Leahy Scale: Poor Standing balance comment: min guard for grooming at sink without UE support                            ADL either  performed or assessed with clinical judgement   ADL Overall ADL's : Needs assistance/impaired     Grooming: Wash/dry face;Oral care;Min guard;Standing               Lower Body Dressing: Min guard;Sit to/from stand Lower Body Dressing Details (indicate cue type and reason): able to adjust socks in sitting with supervision, min guard sit to stand  Toilet Transfer: Min guard;Ambulation;RW Toilet Transfer Details (indicate cue type and reason): simulated in room         Functional mobility during ADLs: Min guard;Rolling walker General ADL Comments: mild unsteadiness in standing      Vision       Perception     Praxis      Cognition Arousal/Alertness: Awake/alert Behavior During Therapy: WFL for tasks assessed/performed Overall Cognitive Status: Impaired/Different from baseline Area of Impairment: Attention;Memory;Following commands;Awareness;Problem solving;Safety/judgement                   Current Attention Level: Sustained Memory: Decreased short-term memory;Decreased recall of precautions Following Commands: Follows one step commands consistently;Follows one step commands with increased time Safety/Judgement: Decreased awareness of safety;Decreased awareness of deficits Awareness: Emergent Problem Solving: Slow processing;Requires verbal cues General Comments: requires cueing for safety awareness, attention and task recall         Exercises     Shoulder Instructions       General Comments sitter present in room    Pertinent Vitals/ Pain       Pain Assessment: No/denies pain  Home Living  Prior Functioning/Environment              Frequency  Min 1X/week        Progress Toward Goals  OT Goals(current goals can now be found in the care plan section)  Progress towards OT goals: Progressing toward goals     Plan Discharge plan remains appropriate;Frequency remains appropriate     Co-evaluation                 AM-PAC OT "6 Clicks" Daily Activity     Outcome Measure   Help from another person eating meals?: None Help from another person taking care of personal grooming?: A Little Help from another person toileting, which includes using toliet, bedpan, or urinal?: A Little Help from another person bathing (including washing, rinsing, drying)?: A Little Help from another person to put on and taking off regular upper body clothing?: A Little Help from another person to put on and taking off regular lower body clothing?: A Little 6 Click Score: 19    End of Session Equipment Utilized During Treatment: Gait belt;Rolling walker  OT Visit Diagnosis: Unsteadiness on feet (R26.81);History of falling (Z91.81)   Activity Tolerance Patient tolerated treatment well   Patient Left with call bell/phone within reach;Other (comment) (seated EOB )   Nurse Communication Mobility status        Time: 1139-1150 OT Time Calculation (min): 11 min  Charges: OT General Charges $OT Visit: 1 Visit OT Treatments $Self Care/Home Management : 8-22 mins  Barry Brunner, OT Acute Rehabilitation Services Pager [phone] Office [phone]    Chancy Milroy 03/06/2020, 1:02 PM

## 2020-03-06 NOTE — Progress Notes (Signed)
Patient with some improvement noted in her paranoia today. She appears to be improving with low dose Zyprexa 2.5mg  po daily. She reports improvement in her hallucinations and is able to have a conversation. She is alert and oriented x3, with improvement in her mentation. Will continue current dose of medication at this time, as she appears to respond quickly to Zyprexa, in conjunction with Abilify. Will reduce Abilify 10mg  po daily at this time.  She continues to meet inpatient criteria at this time for stabilization and management of complex paranoid schizophrenia.

## 2020-03-06 NOTE — Consult Note (Addendum)
[facility] Face-to-Face Psychiatry Consult   Reason for Consult: Hallucinations, worsening psychosis  Referring Physician:  Dr. Mayford Knife Patient Identification: Jessica Campbell MRN:  [id number] Principal Diagnosis: Paranoid schizophrenia, chronic condition with acute exacerbation (HCC) Diagnosis:  Active Problems:   AKI (acute kidney injury) (HCC)   DM (diabetes mellitus), type 2 (HCC)   HTN (hypertension), benign   Paranoid schizophrenia, chronic condition with acute exacerbation (HCC)   HLD (hyperlipidemia)   Acute kidney injury (HCC)   Hallucinations   Total Time spent with patient: 1 hour  Subjective:   Jessica Campbell is a 64 y.o. female patient admitted with hallucination. Patient was seen and assessed by this nurse practitioner, previously for similar presentation. In that time patient has continued to decompensate in terms of her paranoid schizophrenia. On todays evaluation she is observed with worsening paranoia (whispering, looking at the door and the hallways), increase distraction, very suspicious, difficulty thinking clearly, and persistent thoughts that someone is there. She is observed to be sitting up right in bed, playing with her sheets, she appears to be responding to internal stimuli. SHe is observed laughing, whispering, and smiling inappropriately. At one point she was talking to someone who was not there. She has been on her Abilify 15mg  po daily, that was increased since her hospitalization 1 week ago.  She denies any suicidal thoughts at this time, and or homicidal thoughts.  HPI:  Jessica Campbell is a 64 y.o. female with medical history significant of schizoaffective w/ paranoia disorder, hypertension, HLD, diabetes, depression, presents to the emergency department with a chief complaint of hallucinations. She is accompanied by her sister, with whom she lives with. Sister reports that she has been seeing animals, bugs, and babies crawling around the floor while in the  waiting room. She states that her sister was hearing voices which awakened her from sleep last night at around 3 AM. She reports that her sister has been awake since then. She denies any alcohol or drug use. Denies any medication changes. She was admitted for similar symptoms in 65 in June of this year. She was hydrated and stablized. She was transferred to in-patient psychiatry when she was stable. She denies any fever, chills, cough, or shortness of breath. Denies any other associated symptoms.    During the evaluation patient is observed to be very paranoid, disorganized, with some strange behaviors.  She is alert and oriented x2, with some limited mentation, judgment, and poor insight.  She continues to have difficulty forming thoughts and linking relationships, despite multiple prompts. Although she has been compliant on her medication she continues to decompensate from her baseline.  She denies any suicidal ideations.  Past Psychiatric History: Paranoid Schizophrenia. Has tried multiple antipsychotics to include both oral and injectable. REcently she has had multiple psychiatric admissions for her worsening hallucinations and paranoia. She denies any suicide attempts.   Risk to Self:   Denies Risk to Others:   Denies Prior Inpatient Therapy:   Yes, recently 09/20/2019 at [facility] Prior Outpatient Therapy:   [facility]   Past Medical History:  Past Medical History:  Diagnosis Date  . Borderline diabetes   . Depression   . Diabetes mellitus without complication (HCC)   . High cholesterol   . Hypertension   . Schizoaffective disorder (HCC)   . Scoliosis     Past Surgical History:  Procedure Laterality Date  . ABDOMINAL HYSTERECTOMY     Family History: No family history on file. Family Psychiatric  History: Unable to assess Social History:  Social History   Substance and Sexual Activity  Alcohol Use No     Social History   Substance and Sexual Activity  Drug Use No     Social History   Socioeconomic History  . Marital status: Single    Spouse name: Not on file  . Number of children: Not on file  . Years of education: Not on file  . Highest education level: Not on file  Occupational History  . Not on file  Tobacco Use  . Smoking status: Never Smoker  . Smokeless tobacco: Never Used  Substance and Sexual Activity  . Alcohol use: No  . Drug use: No  . Sexual activity: Not on file  Other Topics Concern  . Not on file  Social History Narrative  . Not on file   Social Determinants of Health   Financial Resource Strain:   . Difficulty of Paying Living Expenses: Not on file  Food Insecurity:   . Worried About Programme researcher, broadcasting/film/video in the Last Year: Not on file  . Ran Out of Food in the Last Year: Not on file  Transportation Needs:   . Lack of Transportation (Medical): Not on file  . Lack of Transportation (Non-Medical): Not on file  Physical Activity:   . Days of Exercise per Week: Not on file  . Minutes of Exercise per Session: Not on file  Stress:   . Feeling of Stress : Not on file  Social Connections:   . Frequency of Communication with Friends and Family: Not on file  . Frequency of Social Gatherings with Friends and Family: Not on file  . Attends Religious Services: Not on file  . Active Member of Clubs or Organizations: Not on file  . Attends Banker Meetings: Not on file  . Marital Status: Not on file   Additional Social History:    Allergies:   Allergies  Allergen Reactions  . Paliperidone     Other reaction(s): Hives / Skin Rash  . Quetiapine Fumarate Rash    Other reaction(s): Hives / Skin Rash  . Risperidone Rash    Other reaction(s): Hives / Skin Rash  . Diphenhydramine Hcl     Other reaction(s): Difficulty breathing, Hives / Skin Rash  . Latex Rash    Other reaction(s): Rash, Rash Other reaction(s): DERMATITIS Other reaction(s): DERMATITIS     Labs:  Results for orders placed or performed during  the hospital encounter of 02/27/20 (from the past 48 hour(s))  Glucose, capillary     Status: None   Collection Time: 03/04/20 11:20 AM  Result Value Ref Range   Glucose-Capillary 98 70 - 99 mg/dL    Comment: Glucose reference range applies only to samples taken after fasting for at least 8 hours.  Glucose, capillary     Status: Abnormal   Collection Time: 03/04/20  4:21 PM  Result Value Ref Range   Glucose-Capillary 107 (H) 70 - 99 mg/dL    Comment: Glucose reference range applies only to samples taken after fasting for at least 8 hours.  Glucose, capillary     Status: Abnormal   Collection Time: 03/04/20  8:11 PM  Result Value Ref Range   Glucose-Capillary 110 (H) 70 - 99 mg/dL    Comment: Glucose reference range applies only to samples taken after fasting for at least 8 hours.  Glucose, capillary     Status: Abnormal   Collection Time: 03/05/20  6:06 AM  Result Value Ref Range   Glucose-Capillary  104 (H) 70 - 99 mg/dL    Comment: Glucose reference range applies only to samples taken after fasting for at least 8 hours.  Basic metabolic panel     Status: Abnormal   Collection Time: 03/05/20  8:30 AM  Result Value Ref Range   Sodium 140 135 - 145 mmol/L   Potassium 3.6 3.5 - 5.1 mmol/L   Chloride 104 98 - 111 mmol/L   CO2 25 22 - 32 mmol/L   Glucose, Bld 119 (H) 70 - 99 mg/dL    Comment: Glucose reference range applies only to samples taken after fasting for at least 8 hours.   BUN 11 8 - 23 mg/dL   Creatinine, Ser 4.54 (H) 0.44 - 1.00 mg/dL   Calcium 9.4 8.9 - 09.8 mg/dL   GFR, Estimated 38 (L) >60 mL/min    Comment: (NOTE) Calculated using the CKD-EPI Creatinine Equation (2021)    Anion gap 11 5 - 15    Comment: Performed at [facility], [address] [place], [place] [address]  Glucose, capillary     Status: None   Collection Time: 03/05/20 11:42 AM  Result Value Ref Range   Glucose-Capillary 98 70 - 99 mg/dL    Comment: Glucose reference range applies only to  samples taken after fasting for at least 8 hours.  Glucose, capillary     Status: Abnormal   Collection Time: 03/05/20  4:25 PM  Result Value Ref Range   Glucose-Capillary 115 (H) 70 - 99 mg/dL    Comment: Glucose reference range applies only to samples taken after fasting for at least 8 hours.  Glucose, capillary     Status: Abnormal   Collection Time: 03/05/20  9:26 PM  Result Value Ref Range   Glucose-Capillary 105 (H) 70 - 99 mg/dL    Comment: Glucose reference range applies only to samples taken after fasting for at least 8 hours.  CBC     Status: Abnormal   Collection Time: 03/06/20  3:50 AM  Result Value Ref Range   WBC 6.0 4.0 - 10.5 K/uL   RBC 3.40 (L) 3.87 - 5.11 MIL/uL   Hemoglobin 9.4 (L) 12.0 - 15.0 g/dL   HCT 78.2 (L) 36 - 46 %   MCV 90.0 80.0 - 100.0 fL   MCH 27.6 26.0 - 34.0 pg   MCHC 30.7 30.0 - 36.0 g/dL   RDW 95.6 21.3 - 08.6 %   Platelets 260 150 - 400 K/uL   nRBC 0.0 0.0 - 0.2 %    Comment: Performed at [facility], [address], [place], [place] [address]  Basic metabolic panel     Status: Abnormal   Collection Time: 03/06/20  3:50 AM  Result Value Ref Range   Sodium 141 135 - 145 mmol/L   Potassium 4.4 3.5 - 5.1 mmol/L    Comment: DELTA CHECK NOTED   Chloride 106 98 - 111 mmol/L   CO2 23 22 - 32 mmol/L   Glucose, Bld 92 70 - 99 mg/dL    Comment: Glucose reference range applies only to samples taken after fasting for at least 8 hours.   BUN 15 8 - 23 mg/dL   Creatinine, Ser 9.62 (H) 0.44 - 1.00 mg/dL   Calcium 9.7 8.9 - 95.2 mg/dL   GFR, Estimated 38 (L) >60 mL/min    Comment: (NOTE) Calculated using the CKD-EPI Creatinine Equation (2021)    Anion gap 12 5 - 15    Comment:  Performed at [facility], [address] [place], [place] [address]  Glucose, capillary     Status: None   Collection Time: 03/06/20  6:45 AM  Result Value Ref Range   Glucose-Capillary 81 70 - 99 mg/dL    Comment: Glucose reference range applies only to samples  taken after fasting for at least 8 hours.   Comment 1 Notify RN     Current Facility-Administered Medications  Medication Dose Route Frequency Provider Last Rate Last Admin  . acetaminophen (TYLENOL) tablet 650 mg  650 mg Oral Q6H PRN Jacques Navy, MD   650 mg at 03/03/20 2156   Or  . acetaminophen (TYLENOL) suppository 650 mg  650 mg Rectal Q6H PRN Norins, Rosalyn Gess, MD      . amantadine (SYMMETREL) capsule 100 mg  100 mg Oral BID Theotis Barrio, MD   100 mg at 03/05/20 1011  . ARIPiprazole (ABILIFY) tablet 15 mg  15 mg Oral Daily Maryagnes Amos, FNP      . aspirin EC tablet 81 mg  81 mg Oral Daily Theotis Barrio, MD   81 mg at 03/05/20 1008  . atorvastatin (LIPITOR) tablet 40 mg  40 mg Oral Daily Norins, Rosalyn Gess, MD   40 mg at 03/05/20 1009  . benztropine (COGENTIN) tablet 1 mg  1 mg Oral Daily Miguel Aschoff, MD   1 mg at 03/05/20 1010   And  . benztropine (COGENTIN) tablet 0.5 mg  0.5 mg Oral QHS Miguel Aschoff, MD   0.5 mg at 03/04/20 2044  . haloperidol (HALDOL) tablet 5 mg  5 mg Oral Q6H PRN Dagar, Geralynn Rile, MD       And  . benztropine (COGENTIN) tablet 1 mg  1 mg Oral Q6H PRN Dagar, Geralynn Rile, MD      . heparin injection 5,000 Units  5,000 Units Subcutaneous Q8H Theotis Barrio, MD   5,000 Units at 03/06/20 [phone]  . insulin aspart (novoLOG) injection 0-15 Units  0-15 Units Subcutaneous TID WC Norins, Rosalyn Gess, MD   2 Units at 03/02/20 1201  . OLANZapine zydis (ZYPREXA) disintegrating tablet 2.5 mg  2.5 mg Oral Daily Maryagnes Amos, FNP   2.5 mg at 03/05/20 1508  . pantoprazole (PROTONIX) EC tablet 40 mg  40 mg Oral Daily Theotis Barrio, MD   40 mg at 03/05/20 1009  . senna (SENOKOT) tablet 8.6 mg  1 tablet Oral BID Norins, Rosalyn Gess, MD   8.6 mg at 03/05/20 1009  . sertraline (ZOLOFT) tablet 50 mg  50 mg Oral Daily Norins, Rosalyn Gess, MD   50 mg at 03/05/20 1009  . traZODone (DESYREL) tablet 300 mg  300 mg Oral QHS Norins, Rosalyn Gess, MD   300 mg at 03/04/20  2045    Musculoskeletal: Strength & Muscle Tone: abnormal and increased Gait & Station: unsteady Patient leans: N/A  Psychiatric Specialty Exam: Physical Exam   Review of Systems   Blood pressure (!) 112/56, pulse (!) 49, temperature 97.8 F (36.6 C), temperature source Oral, resp. rate 18, weight 66.5 kg, SpO2 97 %.Body mass index is 26.81 kg/m.  General Appearance: Fairly Groomed and Guarded  Eye Contact:  Fair  Speech:  Clear and Coherent and Slow  Volume:  Normal  Mood:  Anxious and fear  Affect:  Congruent and Full Range  Thought Process:  Disorganized, Irrelevant and Descriptions of Associations: Loose  Orientation:  Other:  Alert and oriented to self and  place  Thought Content:  Illogical, Delusions, Hallucinations: Auditory Visual, Ideas of Reference:   Paranoia and Paranoid Ideation  Suicidal Thoughts:  No  Homicidal Thoughts:  No  Memory:  Immediate;   Fair Recent;   Fair Remote;   Poor  Judgement:  Poor  Insight:  Lacking  Psychomotor Activity:  much improved tremors  Concentration:  Concentration: Fair and Attention Span: Fair  Recall:  Poor  Fund of Knowledge:  Fair  Language:  Fair  Akathisia:  Yes  Handed:  Right  AIMS (if indicated):     Assets:  Communication Skills Desire for Improvement Financial Resources/Insurance Housing Leisure Time Physical Health  ADL's:  Impaired  Cognition:  Impaired,  Mild  Sleep:        Treatment Plan Summary: Plan Continue Oral Abilify 15mg  po daily for schizophrenia. Will benefit from Abilify maintenna once she is medically stable.  Continue 1:1 sitter. - Continue working closely with SW to facilitate placement at inpatient gero-psych facility.  - TD symptoms have improved greatly since last week, will continue Amantadine. Will need to be followed closely for worsening symptoms as we cross titrate antipsychotics.    -Continue Abilify 15mg  po daily. WIll decrease Abilify if patient responds to Zyprexa.  - Will  start olanzapine 2.5mg  po daily to further target positive symptoms of schizophrenia. Patient has previous dystonic reaction with other antipsychotics. Will need to be monitored closely. Will continue to increase Zyprexa if she appears to be responding to this medication.  -Patient appears to have some mild cognitive impairment, also does not appear to have the capacity to make medical decision due to her current limited mentation. Disposition: Recommend psychiatric Inpatient admission when medically cleared.  Maryagnes Amosakia S Starkes-Perry, FNP 03/05/2020 3:24PM

## 2020-03-07 DIAGNOSIS — D631 Anemia in chronic kidney disease: Secondary | ICD-10-CM

## 2020-03-07 DIAGNOSIS — N179 Acute kidney failure, unspecified: Secondary | ICD-10-CM | POA: Diagnosis not present

## 2020-03-07 DIAGNOSIS — F32A Depression, unspecified: Secondary | ICD-10-CM

## 2020-03-07 DIAGNOSIS — I129 Hypertensive chronic kidney disease with stage 1 through stage 4 chronic kidney disease, or unspecified chronic kidney disease: Secondary | ICD-10-CM | POA: Diagnosis not present

## 2020-03-07 DIAGNOSIS — F200 Paranoid schizophrenia: Secondary | ICD-10-CM | POA: Diagnosis not present

## 2020-03-07 DIAGNOSIS — N1831 Chronic kidney disease, stage 3a: Secondary | ICD-10-CM | POA: Diagnosis not present

## 2020-03-07 LAB — BASIC METABOLIC PANEL
Anion gap: 11 (ref 5–15)
BUN: 19 mg/dL (ref 8–23)
CO2: 22 mmol/L (ref 22–32)
Calcium: 9.8 mg/dL (ref 8.9–10.3)
Chloride: 108 mmol/L (ref 98–111)
Creatinine, Ser: 1.62 mg/dL — ABNORMAL HIGH (ref 0.44–1.00)
GFR, Estimated: 35 mL/min — ABNORMAL LOW (ref >60)
Glucose, Bld: 104 mg/dL — ABNORMAL HIGH (ref 70–99)
Potassium: 4.3 mmol/L (ref 3.5–5.1)
Sodium: 141 mmol/L (ref 135–145)

## 2020-03-07 LAB — GLUCOSE, CAPILLARY
Glucose-Capillary: 200 mg/dL — ABNORMAL HIGH (ref 70–99)
Glucose-Capillary: 143 mg/dL — ABNORMAL HIGH (ref 70–99)
Glucose-Capillary: 167 mg/dL — ABNORMAL HIGH (ref 70–99)
Glucose-Capillary: 173 mg/dL — ABNORMAL HIGH (ref 70–99)

## 2020-03-07 MED ORDER — ENSURE ENLIVE PO LIQD
237.0000 mL | Freq: Two times a day (BID) | ORAL | Status: DC
  Administered 2020-03-07 – 2020-03-20 (×26): 237 mL via ORAL

## 2020-03-07 MED ORDER — LACTATED RINGERS IV BOLUS
1000.0000 mL | Freq: Once | INTRAVENOUS | Status: AC
  Administered 2020-03-07: 1000 mL via INTRAVENOUS

## 2020-03-07 NOTE — Progress Notes (Signed)
   Subjective: HD#8 No acute overnight events. Patient evaluated at bedside this AM. Patient denies auditory or visual hallucinations today. States she did not sleep well last night. States does not like the food and did not eat the breakfast. Encouraged to eat & drink well. Denies suicidal or homicidal ideations.  Objective:  Vital signs in last 24 hours: Vitals:   03/06/20 1649 03/06/20 2112 03/07/20 0443 03/07/20 0856  BP: (!) 135/53 115/62 106/82 (!) 117/53  Pulse: (!) 49 (!) 49 (!) 48 (!) 50  Resp: 18 19 16 19   Temp: 98.9 F (37.2 C) 98 F (36.7 C) 99.3 F (37.4 C) 97.9 F (36.6 C)  TempSrc: Oral Oral Oral Oral  SpO2: 98% 95% 97% 99%  Weight:  67.4 kg     CBC Latest Ref Rng & Units 03/06/2020 02/29/2020 02/27/2020  WBC 4.0 - 10.5 K/uL 6.0 6.4 6.8  Hemoglobin 12.0 - 15.0 g/dL 02/29/2020) 0.9(B) 3.5(H)  Hematocrit 36 - 46 % 30.6(L) 26.8(L) 32.1(L)  Platelets 150 - 400 K/uL 260 223 276   BMP Latest Ref Rng & Units 03/07/2020 03/06/2020 03/05/2020  Glucose 70 - 99 mg/dL 03/07/2020) 92 242(A)  BUN 8 - 23 mg/dL 19 15 11   Creatinine 0.44 - 1.00 mg/dL 834(H) ) 9.62(I)  Sodium 135 - 145 mmol/L 141 141 140  Potassium 3.5 - 5.1 mmol/L 4.3 4.4 3.6  Chloride 98 - 111 mmol/L 108 106 104  CO2 22 - 32 mmol/L 22 23 25   Calcium 8.9 - 10.3 mg/dL 9.8 9.7 9.4    Physical exam: General: Well developed, well nourished female sitting comfortably in bed, NAD Neuro: AAO*3 Psych: Denies AVH, Pleasant mood and affect, does not seem like responding to internal stimuli.   Assessment/Plan: Jessica Robinsonis a 64 y.o.with PMH of schizoaffective disorder with paranoia, HTN, HLD, DM, Depressionadmitted for acute kidney injury and visual Hallucinations.  Principal Problem:   Paranoid schizophrenia, chronic condition with acute exacerbation (HCC) Active Problems:   AKI (acute kidney injury) (HCC)   DM (diabetes mellitus), type 2 (HCC)   HTN (hypertension), benign   HLD (hyperlipidemia)   Acute  kidney injury (HCC)   Hallucinations  Schizoaffective disorder Patient denies auditory or visual hallucinations. Denies suicidal or homicidal ideations. Per psychiatry recommendations, patient is for inpatient geriatric psychiatry hospitalization for which is agreeable.SW working hard to get her placement and sent referral to 8.92(J yesterday but they denied a bed for her.  - Awaiting geriatric psychiatry placement  - Greatly appreciate psych assistance - Continue agitation protocol - Change Abilify to10mg  daily - Continue Zoloft 50mg  daily - Continue Trazodone 300mg  qHS  - Continue Zyprexa 2.5 mg  Drug-induced extrapyramidal movement disorder Patient's TD is improving .Currently on Cogentin and amantadine.   - C/w home meds: amantadine 100mg  BID, bentropine 1mg  am, 0.5mg  pm  Acute Kidney Injuryon Chronic Kidney Disease stage Patient's sCr~1.62 this morning, BUN 19. Hoping it to improve further with increased and improved oral intake.  - IV LR 1,000 ml once - Avoid nephrotoxic meds when able - BMP in AM  Prior to Admission Living Arrangement:Home Anticipated Discharge Location:Inpatient psychiatry Barriers to Discharge:Disposition Dispo:Pending Disposition.  , MD 03/07/2020, 11:07 AM Pager: [phone] After 5pm on weekdays and 1pm on weekends: On Call pager [phone]

## 2020-03-07 NOTE — Progress Notes (Signed)
Physical Therapy Treatment Patient Details Name: Jessica Campbell MRN: [id number] DOB: Nov 30, 1955 Today's Date: 03/07/2020    History of Present Illness Jessica Campbell is a 64 y.o. with PMH of schizoaffective disorder with paranoia, HTN, HLD, DM, depression admitted for hallucinations and durg induced extrapyramidal movement disorder.    PT Comments    Pt agreeable to work with therapist. Pt needing to use bathroom requiring mod A for safe transfer on and off commode. Pt requiring min-mod A during ambulation with and without RW, pt with poor coordination of RW. Pt demonstrating narrow BOS and scissoring during ambulation with decrease awareness of deficits or surroundings. Pt demonstrating deficits in balance, strength, coordination, gait, endurance and safety and will benefit from skilled PT to address deficits to maximize independence with functional mobility prior to discharge.    Follow Up Recommendations  Other (comment) (Geriatric inpatient psychiatric hospital)     Equipment Recommendations  Rolling walker with 5" wheels    Recommendations for Other Services       Precautions / Restrictions Precautions Precautions: Fall    Mobility  Bed Mobility Overal bed mobility: Needs Assistance Bed Mobility: Supine to Sit     Supine to sit: Supervision     General bed mobility comments: for safety   Transfers Overall transfer level: Needs assistance Equipment used: 1 person hand held assist;Rolling walker (2 wheeled) Transfers: Sit to/from Stand Sit to Stand: Min assist;Mod assist         General transfer comment: min A for 1 HHA, mod A with use of RW due to increased posterior lean, unable to coordinate pushing up from bed and attempted to pull up on RW  Ambulation/Gait Ambulation/Gait assistance: Min assist;Mod assist   Assistive device: Rolling walker (2 wheeled);1 person hand held assist       General Gait Details: attempted with HHA with pt requiring min-mod A  due to multiple LOB R and L, pt with decreasd attention to task and poor safety awareness, pt with scissoring gait pattern and very narrow BOS; attempted with RW, pt leaning R initially requiring min-mod A, improved to min A on straight ambulation with turns requiring mod A   Stairs             Wheelchair Mobility    Modified Rankin (Stroke Patients Only)       Balance                                            Cognition Arousal/Alertness: Awake/alert Behavior During Therapy: WFL for tasks assessed/performed Overall Cognitive Status: Impaired/Different from baseline Area of Impairment: Attention;Memory;Following commands;Awareness;Problem solving;Safety/judgement                 Orientation Level: Disoriented to;Situation;Time Current Attention Level: Sustained Memory: Decreased short-term memory;Decreased recall of precautions Following Commands: Follows one step commands consistently;Follows one step commands with increased time Safety/Judgement: Decreased awareness of safety;Decreased awareness of deficits     General Comments: requires cueing for safety awareness, attention and task recall       Exercises      General Comments General comments (skin integrity, edema, etc.): sitter and sister present in room; performed tiolet transfer requiring mod A for safe sit<>stand from toilet and assist to maintain balance with hygiene      Pertinent Vitals/Pain Faces Pain Scale: Hurts a little bit Pain Location: back    Home Living  Prior Function            PT Goals (current goals can now be found in the care plan section) Acute Rehab PT Goals Patient Stated Goal: I want these alarms off PT Goal Formulation: With patient Time For Goal Achievement: 03/13/20 Potential to Achieve Goals: Good Progress towards PT goals: Progressing toward goals    Frequency    Min 2X/week      PT Plan Current plan  remains appropriate    Co-evaluation              AM-PAC PT "6 Clicks" Mobility   Outcome Measure  Help needed turning from your back to your side while in a flat bed without using bedrails?: None Help needed moving from lying on your back to sitting on the side of a flat bed without using bedrails?: None Help needed moving to and from a bed to a chair (including a wheelchair)?: A Lot Help needed standing up from a chair using your arms (e.g., wheelchair or bedside chair)?: A Lot Help needed to walk in hospital room?: A Lot Help needed climbing 3-5 steps with a railing? : A Lot 6 Click Score: 16    End of Session Equipment Utilized During Treatment: Gait belt Activity Tolerance: Patient tolerated treatment well Patient left: in chair;with nursing/sitter in room;with call bell/phone within reach;with family/visitor present Nurse Communication: Mobility status PT Visit Diagnosis: Unsteadiness on feet (R26.81);History of falling (Z91.81);Other abnormalities of gait and mobility (R26.89)     Time: 4944-9675 PT Time Calculation (min) (ACUTE ONLY): 15 min  Charges:  $Gait Training: 8-22 mins                     Ginette Otto, DPT Acute Rehabilitation Services [phone]   Lucretia Field 03/07/2020, 2:00 PM

## 2020-03-07 NOTE — Progress Notes (Signed)
Patient older sister would like an update on plan of care. She also wanted to speak with the doctor about some concerns she had about whats going on with her sister. Her name is Renold Don [phone](ZSWF) [phone](home).  Bryer Cozzolino, RN

## 2020-03-07 NOTE — Progress Notes (Signed)
Sister of patient contacted at 11:45 am today. She noted she had no questions or concerns.

## 2020-03-08 DIAGNOSIS — N179 Acute kidney failure, unspecified: Secondary | ICD-10-CM | POA: Diagnosis not present

## 2020-03-08 DIAGNOSIS — R443 Hallucinations, unspecified: Secondary | ICD-10-CM | POA: Diagnosis not present

## 2020-03-08 DIAGNOSIS — F200 Paranoid schizophrenia: Secondary | ICD-10-CM | POA: Diagnosis not present

## 2020-03-08 LAB — GLUCOSE, CAPILLARY
Glucose-Capillary: 100 mg/dL — ABNORMAL HIGH (ref 70–99)
Glucose-Capillary: 161 mg/dL — ABNORMAL HIGH (ref 70–99)
Glucose-Capillary: 143 mg/dL — ABNORMAL HIGH (ref 70–99)
Glucose-Capillary: 155 mg/dL — ABNORMAL HIGH (ref 70–99)
Glucose-Capillary: 121 mg/dL — ABNORMAL HIGH (ref 70–99)

## 2020-03-08 LAB — BASIC METABOLIC PANEL
Anion gap: 12 (ref 5–15)
BUN: 17 mg/dL (ref 8–23)
CO2: 23 mmol/L (ref 22–32)
Calcium: 10 mg/dL (ref 8.9–10.3)
Chloride: 109 mmol/L (ref 98–111)
Creatinine, Ser: 1.43 mg/dL — ABNORMAL HIGH (ref 0.44–1.00)
GFR, Estimated: 41 mL/min — ABNORMAL LOW (ref >60)
Glucose, Bld: 124 mg/dL — ABNORMAL HIGH (ref 70–99)
Potassium: 4.4 mmol/L (ref 3.5–5.1)
Sodium: 144 mmol/L (ref 135–145)

## 2020-03-08 MED ORDER — POLYETHYLENE GLYCOL 3350 17 G PO PACK
17.0000 g | PACK | Freq: Two times a day (BID) | ORAL | Status: DC | PRN
  Administered 2020-03-08: 17 g via ORAL
  Filled 2020-03-08: qty 1

## 2020-03-08 NOTE — Progress Notes (Signed)
   Subjective: HD#9 No acute overnight events. Patient evaluated at bedside this morning. Admits to auditory hallucinations & person telling her to hurt her finger & toe nail. Admits to visual hallucinations, seeing "Jessica Campbell's". Denies any suicidal or homicidal ideations.   Objective:  Vital signs in last 24 hours: Vitals:   03/07/20 1702 03/07/20 2049 03/08/20 0528 03/08/20 0800  BP: 106/82 (!) 126/45 (!) 129/44 (!) 117/49  Pulse: 62 (!) 50 (!) 54 (!) 54  Resp: 17 20 18 18   Temp: 98.4 F (36.9 C) 98.5 F (36.9 C) 98.3 F (36.8 C) 97.7 F (36.5 C)  TempSrc: Oral  Oral Oral  SpO2: 100% 98% 100% 99%  Weight:       BMP Latest Ref Rng & Units 03/08/2020 03/07/2020 03/06/2020  Glucose 70 - 99 mg/dL 03/08/2020) 643(P) 92  BUN 8 - 23 mg/dL 17 19 15   Creatinine 0.44 - 1.00 mg/dL 295(J) ) 8.84(Z)  Sodium 135 - 145 mmol/L 144 141 141  Potassium 3.5 - 5.1 mmol/L 4.4 4.3 4.4  Chloride 98 - 111 mmol/L 109 108 106  CO2 22 - 32 mmol/L 23 22 23   Calcium 8.9 - 10.3 mg/dL 6.60(Y 9.8 9.7   Physical exam: General: Well developed, well nourished female sitting comfortably in bed, NAD Neuro: AAO*3 Psych: Admits to auditory and visual hallucinations. Seems like responding to the internal stimuli.  Assessment/Plan: Jessica Robinsonis a 64 y.o.with PMH of schizoaffective disorder with paranoia, HTN, HLD, DM, Depressionadmitted for acute kidney injury and visual Hallucinations.  Principal Problem:   Paranoid schizophrenia, chronic condition with acute exacerbation (HCC) Active Problems:   AKI (acute kidney injury) (HCC)   DM (diabetes mellitus), type 2 (HCC)   HTN (hypertension), benign   HLD (hyperlipidemia)   Acute kidney injury (HCC)   Hallucinations  Schizoaffective disorder Patient admits to auditory hallucinations with voices telling him to hurt her finger nail & toe nail. Admits to visual hallucinations and clearly responding to the internal stimuli. Denies suicidal or homicidal  ideations. Per psychiatry recommendations, patient is for inpatient geriatric psychiatry hospitalization for which is agreeable.  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   - C/w home meds: amantadine 100mg  BID, bentropine 1mg  am, 0.5mg  pm  Acute Kidney Injuryon Chronic Kidney Disease stage Patient's sCr~1.43 this morning, BUN 17. Encouragement to eat/drink well.    - Avoid nephrotoxic meds when able   Prior to Admission Living Arrangement:Home Anticipated Discharge Location:Inpatient psychiatry Barriers to Discharge:Disposition Dispo:Pending Disposition.    , MD 03/08/2020, 1:03 PM Pager: [phone] After 5pm on weekdays and 1pm on weekends: On Call pager [phone]

## 2020-03-08 NOTE — Plan of Care (Signed)
  Problem: Education: Goal: Knowledge of General Education information will improve Description Including pain rating scale, medication(s)/side effects and non-pharmacologic comfort measures Outcome: Progressing   

## 2020-03-08 NOTE — Progress Notes (Addendum)
Occupational Therapy Treatment Patient Details Name: Jessica Campbell MRN: [id number] DOB: 1956/01/04 Today's Date: 03/08/2020    History of present illness Jessica Campbell is a 64 y.o. with PMH of schizoaffective disorder with paranoia, HTN, HLD, DM, depression admitted for hallucinations and durg induced extrapyramidal movement disorder.   OT comments  OT treatment session with focus on self-care re-education, functional transfers, and household mobility in prep for ADLs. Patient oriented to person only. Reorientation to place, situation, and date. Patient unable to recall upcoming holiday but able to state "[place]" when asked "What holiday is in December?". Patient demonstrates increased need for steadying with HHA during functional mobility with lateral/posterior LOB requiring up to Min A to correct. Patient also demonstrating poor attention to task requiring frequent redirection. Frequency updated to 2x weekly to address deficits in balance, attention, safety awareness, coordination, and increased need for assistance with self-care tasks. Patient would benefit from continued acute OT services prior to d/c to next level of care.    Follow Up Recommendations  No OT follow up;Other (comment) (Inpatient psych )    Equipment Recommendations  3 in 1 bedside commode    Recommendations for Other Services      Precautions / Restrictions Precautions Precautions: Fall Restrictions Weight Bearing Restrictions: No       Mobility Bed Mobility               General bed mobility comments: Patient seated in recliner upon entry.   Transfers Overall transfer level: Needs assistance Equipment used: 1 person hand held assist;Rolling walker (2 wheeled) Transfers: Sit to/from Stand Sit to Stand: Min guard         General transfer comment: Min guard for sit to stand from recliner x2 and from commode in bathroom with HHA +1 and use of grab bar and armrests.     Balance Overall balance  assessment: Needs assistance Sitting-balance support: Feet supported Sitting balance-Leahy Scale: Fair     Standing balance support: Single extremity supported;During functional activity Standing balance-Leahy Scale: Poor Standing balance comment: Min A for functional mobility in hallway without AD and HHA. Patient losing balance to L and posteriorly. Very poor attention to task requiring frequent redirection.                            ADL either performed or assessed with clinical judgement   ADL                           Toilet Transfer: Minimal assistance;Ambulation Toilet Transfer Details (indicate cue type and reason): Patient transferred to commode in bathroom with Min A and HHA for steadying with cues for hand placement.          Functional mobility during ADLs: Minimal assistance General ADL Comments: Patient very unsteady without use of AD and HHA requiring Min A. Decreased attention to task requiring frequent redirection.      Vision       Perception     Praxis      Cognition Arousal/Alertness: Awake/alert Behavior During Therapy: WFL for tasks assessed/performed Overall Cognitive Status: Impaired/Different from baseline Area of Impairment: Attention;Memory;Following commands;Awareness;Problem solving;Safety/judgement                 Orientation Level: Disoriented to;Situation;Time Current Attention Level: Sustained Memory: Decreased short-term memory;Decreased recall of precautions Following Commands: Follows one step commands consistently;Follows one step commands with increased time Safety/Judgement: Decreased awareness of  safety;Decreased awareness of deficits   Problem Solving: Slow processing;Requires verbal cues;Difficulty sequencing General Comments: Patient requires cues for safety awareness and sequening familiar tasks.         Exercises     Shoulder Instructions       General Comments Sitter present in room     Pertinent Vitals/ Pain       Pain Assessment: No/denies pain  Home Living                                          Prior Functioning/Environment              Frequency  Min 2X/week        Progress Toward Goals  OT Goals(current goals can now be found in the care plan section)  Progress towards OT goals: Progressing toward goals  Acute Rehab OT Goals Patient Stated Goal: No goals stated OT Goal Formulation: With patient/family Time For Goal Achievement: 03/16/20 Potential to Achieve Goals: Good ADL Goals Pt Will Perform Lower Body Dressing: with modified independence;sit to/from stand Pt Will Perform Toileting - Clothing Manipulation and hygiene: with modified independence;sit to/from stand Pt Will Perform Tub/Shower Transfer: Tub transfer;shower seat;grab bars Additional ADL Goal #1: Patient will demonstrate ability for day to day recall/carry over during activities of daily living with supervision A.  Plan Discharge plan remains appropriate;Frequency needs to be updated    Co-evaluation                 AM-PAC OT "6 Clicks" Daily Activity     Outcome Measure   Help from another person eating meals?: None Help from another person taking care of personal grooming?: A Little Help from another person toileting, which includes using toliet, bedpan, or urinal?: A Little Help from another person bathing (including washing, rinsing, drying)?: A Little Help from another person to put on and taking off regular upper body clothing?: A Little Help from another person to put on and taking off regular lower body clothing?: A Little 6 Click Score: 19    End of Session Equipment Utilized During Treatment: Gait belt  OT Visit Diagnosis: Unsteadiness on feet (R26.81);History of falling (Z91.81)   Activity Tolerance Patient tolerated treatment well   Patient Left in chair;with nursing/sitter in room   Nurse Communication Mobility status         Time: 0240-9735 OT Time Calculation (min): 15 min  Charges: OT General Charges $OT Visit: 1 Visit OT Treatments $Self Care/Home Management : 8-22 mins  Christi Wirick H. OTR/L Supplemental OT, Department of rehab services [phone]   Zhion Pevehouse R H. 03/08/2020, 2:58 PM

## 2020-03-08 NOTE — TOC Progression Note (Signed)
Transition of Care [facility]) - Progression Note    Patient Details  Name: Jessica Campbell MRN: [id number] Date of Birth: December 19, 1955  [facility]) CM/SW Contact  Okey Dupre Lazaro Arms, LCSW Phone Number: 03/08/2020, 2:27 PM  Clinical Narrative:  [facility] contacted regarding bed availability *[facility] contacted and per Jerilynn Som in Intake - no bed availability today *[facility] [phone]) contacted and referral faxed [phone]) [facility] [phone]) contacted and message left.  *[facility] [phone]) contacted and they have no available beds today. Intake person requested that their sister facility [place] be contacted. [facility] [phone]) contacted and per Tammy in intake, they have one bed available. She requested that clinicals be faxed to [phone]. Clinicals faxed to Tammy's attention.  CSW will continue to follow and await call from [facility] after patient's clinicals reviewed.         Expected Discharge Plan and Services                                                 Social Determinants of Health (SDOH) Interventions  Patient in need of inpatient psychiatric placement  Readmission Risk Interventions No flowsheet data found.

## 2020-03-09 DIAGNOSIS — N179 Acute kidney failure, unspecified: Secondary | ICD-10-CM | POA: Diagnosis not present

## 2020-03-09 DIAGNOSIS — F200 Paranoid schizophrenia: Secondary | ICD-10-CM | POA: Diagnosis not present

## 2020-03-09 DIAGNOSIS — I129 Hypertensive chronic kidney disease with stage 1 through stage 4 chronic kidney disease, or unspecified chronic kidney disease: Secondary | ICD-10-CM | POA: Diagnosis not present

## 2020-03-09 DIAGNOSIS — N1831 Chronic kidney disease, stage 3a: Secondary | ICD-10-CM | POA: Diagnosis not present

## 2020-03-09 LAB — GLUCOSE, CAPILLARY
Glucose-Capillary: 109 mg/dL — ABNORMAL HIGH (ref 70–99)
Glucose-Capillary: 169 mg/dL — ABNORMAL HIGH (ref 70–99)
Glucose-Capillary: 170 mg/dL — ABNORMAL HIGH (ref 70–99)

## 2020-03-09 MED ORDER — SENNA 8.6 MG PO TABS
2.0000 | ORAL_TABLET | Freq: Two times a day (BID) | ORAL | Status: DC
  Administered 2020-03-09 – 2020-04-16 (×72): 17.2 mg via ORAL
  Filled 2020-03-09 (×75): qty 2

## 2020-03-09 MED ORDER — POLYETHYLENE GLYCOL 3350 17 G PO PACK
17.0000 g | PACK | Freq: Two times a day (BID) | ORAL | Status: DC
  Administered 2020-03-09 – 2020-04-16 (×64): 17 g via ORAL
  Filled 2020-03-09 (×72): qty 1

## 2020-03-09 NOTE — Plan of Care (Signed)
  Problem: Education: Goal: Knowledge of General Education information will improve Description: Including pain rating scale, medication(s)/side effects and non-pharmacologic comfort measures Outcome: Not Progressing   

## 2020-03-09 NOTE — Progress Notes (Signed)
Physical Therapy Treatment Patient Details Name: Tais Koestner MRN: [id number] DOB: [birth year] Today's Date: 03/09/2020    History of Present Illness Miri Inboden is a 64 y.o. with PMH of schizoaffective disorder with paranoia, HTN, HLD, DM, depression admitted for hallucinations and durg induced extrapyramidal movement disorder.    PT Comments    Patient received in bed, very pleasantly confused and cooperative. Really benefits from simple, one step commands for sequencing of familiar tasks and navigation in hallway. Continues to require fairly consistent ModA for gait in hallway due to poor balance and high levels of distractibility, also frequent reminders to stay on task during session. Cognition might be slightly better today- in hallway, able to tell me what the unit christmas tree was and what holiday its associated with, also able to tell me that she is in [place] which is a hospital with multiple leading questions. Left up in recliner with all needs met, safety sitter providing direct supervision.    Follow Up Recommendations  Other (comment) ([facility])     Equipment Recommendations  Rolling walker with 5" wheels    Recommendations for Other Services       Precautions / Restrictions Precautions Precautions: Fall Restrictions Weight Bearing Restrictions: No    Mobility  Bed Mobility Overal bed mobility: Needs Assistance Bed Mobility: Supine to Sit     Supine to sit: Supervision     General bed mobility comments: increased time and effort, mild posterior lean  Transfers Overall transfer level: Needs assistance Equipment used: 1 person hand held assist Transfers: Sit to/from Stand Sit to Stand: Min assist         General transfer comment: MinA and multiple attempt to get to full standing and gait balance, does have tendency to lose balance posteriorly when first standing  Ambulation/Gait Ambulation/Gait assistance: Mod  assist Gait Distance (Feet): 160 Feet Assistive device: 1 person hand held assist Gait Pattern/deviations: Step-through pattern;Decreased stride length;Drifts right/left;Scissoring;Narrow base of support Gait velocity: decreased   General Gait Details: needed fairly consistent ModA to maintain safety and balance, especially when turning. Needed step by step cues for navigation in hallway. Very easily distracted and needed verbal cues to pay attention to her walking. Easily fatigued   Stairs             Wheelchair Mobility    Modified Rankin (Stroke Patients Only)       Balance Overall balance assessment: Needs assistance Sitting-balance support: Feet supported Sitting balance-Leahy Scale: Fair Sitting balance - Comments: supervision for safety at EOB, mild posterior lean Postural control: Posterior lean Standing balance support: Single extremity supported;During functional activity Standing balance-Leahy Scale: Poor Standing balance comment: pretty consistent ModA for balance today with multidirectional balance loss                            Cognition Arousal/Alertness: Awake/alert Behavior During Therapy: WFL for tasks assessed/performed Overall Cognitive Status: Impaired/Different from baseline Area of Impairment: Attention;Memory;Following commands;Awareness;Problem solving;Safety/judgement                 Orientation Level: Disoriented to;Situation;Time;Place Current Attention Level: Sustained Memory: Decreased short-term memory;Decreased recall of precautions Following Commands: Follows one step commands consistently;Follows one step commands with increased time Safety/Judgement: Decreased awareness of safety;Decreased awareness of deficits Awareness: Intellectual Problem Solving: Slow processing;Requires verbal cues;Difficulty sequencing General Comments: cues for safety and sequencing; simple, one step commands with extended time for  processing are successful, but needs  lots of leading questions to figure out where she is and that this is a hospital      Exercises      General Comments General comments (skin integrity, edema, etc.): safety sitter present in room      Pertinent Vitals/Pain Pain Assessment: Faces Faces Pain Scale: No hurt Pain Intervention(s): Limited activity within patient's tolerance;Monitored during session    Home Living                      Prior Function            PT Goals (current goals can now be found in the care plan section) Acute Rehab PT Goals Patient Stated Goal: No goals stated PT Goal Formulation: With patient Time For Goal Achievement: 03/13/20 Potential to Achieve Goals: Good Progress towards PT goals: Progressing toward goals    Frequency    Min 2X/week      PT Plan Current plan remains appropriate    Co-evaluation              AM-PAC PT "6 Clicks" Mobility   Outcome Measure  Help needed turning from your back to your side while in a flat bed without using bedrails?: None Help needed moving from lying on your back to sitting on the side of a flat bed without using bedrails?: None Help needed moving to and from a bed to a chair (including a wheelchair)?: A Lot Help needed standing up from a chair using your arms (e.g., wheelchair or bedside chair)?: A Lot Help needed to walk in hospital room?: A Lot Help needed climbing 3-5 steps with a railing? : A Lot 6 Click Score: 16    End of Session Equipment Utilized During Treatment: Gait belt Activity Tolerance: Patient tolerated treatment well Patient left: in chair;with call bell/phone within reach;with nursing/sitter in room Nurse Communication: Mobility status PT Visit Diagnosis: Unsteadiness on feet (R26.81);History of falling (Z91.81);Other abnormalities of gait and mobility (R26.89)     Time: 1130-1146 PT Time Calculation (min) (ACUTE ONLY): 16 min  Charges:  $Gait Training: 8-22  mins                     Windell Norfolk, DPT, PN1   Supplemental Physical Therapist [place]    Pager [phone] Acute Rehab Office [phone]

## 2020-03-09 NOTE — Progress Notes (Signed)
   Subjective: HD#10 No acute overnight events. Patient evaluated at bedside this morning. Admits to auditory and visual hallucinations. Denies suicidal or homicidal ideations. Otherwise states feels good this morning, was able to sleep well. States it's difficult to eat food as it is not good.  Objective:  Vital signs in last 24 hours: Vitals:   03/08/20 2115 03/09/20 0507 03/09/20 0856 03/09/20 1423  BP: (!) 106/59 (!) 143/66 (!) 143/55 (!) 128/56  Pulse: (!) 52 (!) 55 (!) 52 (!) 52  Resp:   14 14  Temp: 97.6 F (36.4 C) 97.8 F (36.6 C) 98.6 F (37 C) 98.4 F (36.9 C)  TempSrc: Oral Oral Oral Oral  SpO2: 100% 94% 97% 97%  Weight:       Physical exam: General: Well developed, well nourished female lyingcomfortably in bed, NAD Abdomen: Soft, no guarding, BS+ Neuro: AAO*3 Psych: Admits to auditory and visual hallucinations. Seems like responding to the internal stimuli.  Assessment/Plan: Baileigh Robinsonis a 64 y.o.with PMH of schizoaffective disorder with paranoia, HTN, HLD, DM, Depressionadmitted for acute kidney injury and visual Hallucinations.  Principal Problem:   Paranoid schizophrenia, chronic condition with acute exacerbation (HCC) Active Problems:   AKI (acute kidney injury) (HCC)   DM (diabetes mellitus), type 2 (HCC)   HTN (hypertension), benign   HLD (hyperlipidemia)   Acute kidney injury (HCC)   Hallucinations  Schizoaffective disorder Patient is seen responding to internal stimuli. Admits to AVH. Denies active suicidal ideations or homicidal ideations.Psychiatry is consulted .Waiting on their updated recommendations.  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   - C/w home meds: amantadine 100mg  BID, bentropine 1mg  am, 0.5mg  pm  Acute Kidney Injuryon Chronic Kidney Disease stage  Encouragement to eat/drink well.    - Avoid nephrotoxic meds when able   Prior to Admission Living Arrangement:Home Anticipated Discharge Location:Inpatient psychiatry Barriers to Discharge:Disposition Dispo:Pending Disposition.  , MD 03/09/2020, 3:46 PM Pager: [phone] After 5pm on weekdays and 1pm on weekends: On Call pager [phone]

## 2020-03-09 NOTE — TOC Progression Note (Signed)
Transition of Care [facility]) - Progression Note    Patient Details  Name: Jessica Campbell MRN: [id number] Date of Birth: January 02, 1956  [facility]) CM/SW Contact  Okey Dupre Lazaro Arms, LCSW Phone Number: 03/09/2020, 5:31 PM  Clinical Narrative:  [name] is a [facility] participant and CSW was contacted by [facility] SW Mamie Levers [phone]) today requesting an update. CSW was advised that [facility] can be called at any time if needed - main number.        Expected Discharge Plan and Services                                                 Social Determinants of Health (SDOH) Interventions  Patient in need of psychiatric placement.  Readmission Risk Interventions No flowsheet data found.

## 2020-03-10 DIAGNOSIS — G2570 Drug induced movement disorder, unspecified: Secondary | ICD-10-CM

## 2020-03-10 LAB — GLUCOSE, CAPILLARY
Glucose-Capillary: 154 mg/dL — ABNORMAL HIGH (ref 70–99)
Glucose-Capillary: 147 mg/dL — ABNORMAL HIGH (ref 70–99)
Glucose-Capillary: 155 mg/dL — ABNORMAL HIGH (ref 70–99)

## 2020-03-10 MED ORDER — OLANZAPINE 5 MG PO TBDP
5.0000 mg | ORAL_TABLET | Freq: Every day | ORAL | Status: DC
  Administered 2020-03-10 – 2020-03-12 (×3): 5 mg via ORAL
  Filled 2020-03-10 (×3): qty 1

## 2020-03-10 NOTE — Progress Notes (Signed)
   Subjective:   No acute complaints this afternoon. She is eating her lunch and states she is feeling well. Denies any bowel movements yet.    Objective:  Vital signs in last 24 hours: Vitals:   03/09/20 1423 03/09/20 1714 03/09/20 2022 03/10/20 0420  BP: (!) 128/56 (!) 104/48 (!) 121/46 (!) 115/44  Pulse: (!) 52 60 (!) 50 (!) 45  Resp: 14 16 16 15   Temp: 98.4 F (36.9 C) 98.2 F (36.8 C) 98.9 F (37.2 C) 99 F (37.2 C)  TempSrc: Oral Oral Oral Oral  SpO2: 97% 99% 99% 95%  Weight:   67.4 kg    Physical Exam Vitals and nursing note reviewed.  Constitutional:      General: She is not in acute distress.    Appearance: She is normal weight.  Neurological:     Mental Status: She is alert.     Motor: Tremor present.  Psychiatric:        Attention and Perception: She perceives auditory and visual hallucinations.        Mood and Affect: Mood and affect normal.        Speech: Speech normal.        Behavior: Behavior is not agitated or aggressive. Behavior is cooperative.        Thought Content: Thought content is delusional.    Assessment/Plan:  Principal Problem:   Paranoid schizophrenia, chronic condition with acute exacerbation (HCC) Active Problems:   AKI (acute kidney injury) (HCC)   DM (diabetes mellitus), type 2 (HCC)   HTN (hypertension), benign   HLD (hyperlipidemia)   Acute kidney injury (HCC)   Hallucinations  Jessica Robinsonis a 64 y.o.with PMH of schizoaffective disorder with paranoia, HTN, HLD, DM, Depressionadmitted for acute kidney injury and visual Hallucinations.  Schizoaffective disorder Patient is seen responding to internal stimuli. Admits to AVH. Denies active suicidal ideations or homicidal ideations.Psychiatry is consulted .Waiting on their updated recommendations.  - Awaiting geriatric psychiatry placement. Will need to discuss with psychiatry if inpatient placement is still needed, as patient has stabilized at this point.  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   - C/w home meds: amantadine 100mg  BID, bentropine 1mg  am, 0.5mg  pm  Acute Kidney Injuryon Chronic Kidney Disease stage Improving.   - Avoid nephrotoxic agents  - Encourage PO intake    Dr. 64 Internal Medicine PGY-2  Pager: [phone] After 5pm on weekdays and 1pm on weekends: On Call pager [phone]  03/10/2020, 7:49 AM

## 2020-03-10 NOTE — Plan of Care (Signed)
  Problem: Education: Goal: Knowledge of General Education information will improve Description Including pain rating scale, medication(s)/side effects and non-pharmacologic comfort measures Outcome: Progressing   

## 2020-03-10 NOTE — Progress Notes (Signed)
Occupational Therapy Treatment Patient Details Name: Jessica Campbell MRN: [id number] DOB: 12-20-1955 Today's Date: 03/10/2020    History of present illness Jessica Campbell is a 64 y.o. with PMH of schizoaffective disorder with paranoia, HTN, HLD, DM, depression admitted for hallucinations and durg induced extrapyramidal movement disorder.   OT comments  Pt supine in bed upon OTA arrival agreeable to OT intervention. Pt transitions from supine>EOB MOD I and requires MIN A for sit<>stand with HHA. Pt with initial posterior lean upon standing. Pt able to complete household distance functional mobility needing up to MOD A for balance as pt tends to lose balance during transitions. Pt reports seeing bugs on floor in room. Pt able to problem solve approaching holiday based on Christmas tree in hallway with no cues. Agree with DC plan below, will follow acutely per POC.   Follow Up Recommendations  No OT follow up;Other (comment) (Inpatient psych)    Equipment Recommendations  3 in 1 bedside commode    Recommendations for Other Services      Precautions / Restrictions Precautions Precautions: Fall Restrictions Weight Bearing Restrictions: No       Mobility Bed Mobility Overal bed mobility: Modified Independent Bed Mobility: Supine to Sit     Supine to sit: Modified independent (Device/Increase time)     General bed mobility comments: no physical assist needed  Transfers Overall transfer level: Needs assistance Equipment used: 1 person hand held assist Transfers: Sit to/from Stand Sit to Stand: Min assist         General transfer comment: MIN A d/t initial posterior lean upon standing. pt reports " I see balloons" after standing from bed feel pt was referring to painting of hot air balloons in hallway    Balance Overall balance assessment: Needs assistance Sitting-balance support: Feet supported Sitting balance-Leahy Scale: Fair Sitting balance - Comments: supervision for  safety at EOB   Standing balance support: Single extremity supported;During functional activity Standing balance-Leahy Scale: Poor Standing balance comment: pt needing up to MOD A during transitions during ambulation                           ADL either performed or assessed with clinical judgement   ADL Overall ADL's : Needs assistance/impaired                     Lower Body Dressing: Sitting/lateral leans;Supervision/safety Lower Body Dressing Details (indicate cue type and reason): able to adjust socks in sitting with supervision Toilet Transfer: Minimal assistance;Ambulation Toilet Transfer Details (indicate cue type and reason): simulated via functional mobility up to MOD A for balane d/t LOB         Functional mobility during ADLs: Minimal assistance;Moderate assistance General ADL Comments: Patient very unsteady without use of AD and HHA requiring Min A. Decreased attention to task requiring frequent redirection. continues to report seeing bugs in her room     Vision       Perception     Praxis      Cognition Arousal/Alertness: Awake/alert Behavior During Therapy: [facility] for tasks assessed/performed Overall Cognitive Status: Impaired/Different from baseline Area of Impairment: Attention;Following commands;Awareness;Problem solving;Safety/judgement                   Current Attention Level: Sustained   Following Commands: Follows one step commands consistently;Follows one step commands with increased time Safety/Judgement: Decreased awareness of safety;Decreased awareness of deficits Awareness: Intellectual Problem Solving: Slow processing;Requires verbal cues;Difficulty sequencing  General Comments: benefits from tactile directional cues with mobility, oriented to approaching hoilday based on seeing christmas tree in hallway. pts sister present during session very helpful. pt continues to report seeing bugs in room        Exercises      Shoulder Instructions       General Comments pts sister present during session    Pertinent Vitals/ Pain       Pain Assessment: No/denies pain  Home Living                                          Prior Functioning/Environment              Frequency  Min 2X/week        Progress Toward Goals  OT Goals(current goals can now be found in the care plan section)  Progress towards OT goals: Progressing toward goals  Acute Rehab OT Goals Patient Stated Goal: No goals stated OT Goal Formulation: With patient/family Time For Goal Achievement: 03/16/20 Potential to Achieve Goals: Good  Plan Discharge plan remains appropriate;Frequency remains appropriate    Co-evaluation                 AM-PAC OT "6 Clicks" Daily Activity     Outcome Measure   Help from another person eating meals?: None Help from another person taking care of personal grooming?: A Little Help from another person toileting, which includes using toliet, bedpan, or urinal?: A Little Help from another person bathing (including washing, rinsing, drying)?: A Little Help from another person to put on and taking off regular upper body clothing?: A Little Help from another person to put on and taking off regular lower body clothing?: A Little 6 Click Score: 19    End of Session Equipment Utilized During Treatment: Gait belt  OT Visit Diagnosis: Unsteadiness on feet (R26.81);History of falling (Z91.81)   Activity Tolerance Patient tolerated treatment well   Patient Left in chair;with call bell/phone within reach;with family/visitor present;Other (comment) (sister in room stating she would alert RN when she was leaving)   Nurse Communication Mobility status        Time: 9323-5573 OT Time Calculation (min): 17 min  Charges: OT General Charges $OT Visit: 1 Visit OT Treatments $Therapeutic Activity: 8-22 mins  Audery Amel., COTA/L Acute Rehabilitation  Services [phone]    Angelina Pih 03/10/2020, 4:37 PM

## 2020-03-10 NOTE — Consult Note (Signed)
[facility] Face-to-Face Psychiatry Consult   Reason for Consult:"Hallucinations.'' Referring Physician:  Reymundo Poll, MD Patient Identification: Jessica Campbell MRN:  [id number] Principal Diagnosis: Paranoid schizophrenia, chronic condition with acute exacerbation (HCC) Diagnosis:  Principal Problem:   Paranoid schizophrenia, chronic condition with acute exacerbation (HCC) Active Problems:   AKI (acute kidney injury) (HCC)   DM (diabetes mellitus), type 2 (HCC)   HTN (hypertension), benign   HLD (hyperlipidemia)   Acute kidney injury (HCC)   Hallucinations   Total Time spent with patient: 30 minutes  Subjective: ''I have been seeing things, I saw a little boy in my room 2 nights ago.'' Objective:  64 y.o. female who reports long history of Paranoid schizophrenia for which she was receiving anti-psychotic from ''[facility]'' in [place]. She was admitted for auditory and visual hallucinations but says she is no longer hearing voices but has continued to see things:''I have been seeing a little black boy coming to my room but he does not talk to me.'' Patient remains paranoid, hyper vigilant, suspicious and appears to be responding to internal stimuli. However, she reports that her current medications are helping. Patient denies self harming thoughts or history of drug/alcohol abuse.  Past Psychiatric History: Paranoid Schizophrenia. Has tried multiple antipsychotics to include both oral and injectable. Recently she has had multiple psychiatric admissions for her worsening hallucinations and paranoia. She denies any suicide attempts.   Risk to Self:   Denies Risk to Others:   Denies Prior Inpatient Therapy:   Yes, recently 09/20/2019 at [facility] Prior Outpatient Therapy:   [facility]   Past Medical History:  Past Medical History:  Diagnosis Date  . Borderline diabetes   . Depression   . Diabetes mellitus without complication (HCC)   . High cholesterol   . Hypertension   . Schizoaffective  disorder (HCC)   . Scoliosis     Past Surgical History:  Procedure Laterality Date  . ABDOMINAL HYSTERECTOMY     Family History: No family history on file. Family Psychiatric  History: Unable to assess Social History:  Social History   Substance and Sexual Activity  Alcohol Use No     Social History   Substance and Sexual Activity  Drug Use No    Social History   Socioeconomic History  . Marital status: Single    Spouse name: Not on file  . Number of children: Not on file  . Years of education: Not on file  . Highest education level: Not on file  Occupational History  . Not on file  Tobacco Use  . Smoking status: Never Smoker  . Smokeless tobacco: Never Used  Substance and Sexual Activity  . Alcohol use: No  . Drug use: No  . Sexual activity: Not on file  Other Topics Concern  . Not on file  Social History Narrative  . Not on file   Social Determinants of Health   Financial Resource Strain:   . Difficulty of Paying Living Expenses: Not on file  Food Insecurity:   . Worried About Programme researcher, broadcasting/film/video in the Last Year: Not on file  . Ran Out of Food in the Last Year: Not on file  Transportation Needs:   . Lack of Transportation (Medical): Not on file  . Lack of Transportation (Non-Medical): Not on file  Physical Activity:   . Days of Exercise per Week: Not on file  . Minutes of Exercise per Session: Not on file  Stress:   . Feeling of Stress : Not  on file  Social Connections:   . Frequency of Communication with Friends and Family: Not on file  . Frequency of Social Gatherings with Friends and Family: Not on file  . Attends Religious Services: Not on file  . Active Member of Clubs or Organizations: Not on file  . Attends Banker Meetings: Not on file  . Marital Status: Not on file   Additional Social History:    Allergies:   Allergies  Allergen Reactions  . Paliperidone     Other reaction(s): Hives / Skin Rash  . Quetiapine Fumarate  Rash    Other reaction(s): Hives / Skin Rash  . Risperidone Rash    Other reaction(s): Hives / Skin Rash  . Diphenhydramine Hcl     Other reaction(s): Difficulty breathing, Hives / Skin Rash  . Latex Rash    Other reaction(s): Rash, Rash Other reaction(s): DERMATITIS Other reaction(s): DERMATITIS     Labs:  Results for orders placed or performed during the hospital encounter of 02/27/20 (from the past 48 hour(s))  Glucose, capillary     Status: Abnormal   Collection Time: 03/08/20  5:35 PM  Result Value Ref Range   Glucose-Capillary 143 (H) 70 - 99 mg/dL    Comment: Glucose reference range applies only to samples taken after fasting for at least 8 hours.  Glucose, capillary     Status: Abnormal   Collection Time: 03/08/20  7:06 PM  Result Value Ref Range   Glucose-Capillary 155 (H) 70 - 99 mg/dL    Comment: Glucose reference range applies only to samples taken after fasting for at least 8 hours.  Glucose, capillary     Status: Abnormal   Collection Time: 03/08/20  9:14 PM  Result Value Ref Range   Glucose-Capillary 121 (H) 70 - 99 mg/dL    Comment: Glucose reference range applies only to samples taken after fasting for at least 8 hours.  Glucose, capillary     Status: Abnormal   Collection Time: 03/09/20  6:54 AM  Result Value Ref Range   Glucose-Capillary 109 (H) 70 - 99 mg/dL    Comment: Glucose reference range applies only to samples taken after fasting for at least 8 hours.  Glucose, capillary     Status: Abnormal   Collection Time: 03/09/20 11:47 AM  Result Value Ref Range   Glucose-Capillary 169 (H) 70 - 99 mg/dL    Comment: Glucose reference range applies only to samples taken after fasting for at least 8 hours.  Glucose, capillary     Status: Abnormal   Collection Time: 03/09/20  4:41 PM  Result Value Ref Range   Glucose-Capillary 170 (H) 70 - 99 mg/dL    Comment: Glucose reference range applies only to samples taken after fasting for at least 8 hours.  Glucose,  capillary     Status: Abnormal   Collection Time: 03/10/20 11:34 AM  Result Value Ref Range   Glucose-Capillary 154 (H) 70 - 99 mg/dL    Comment: Glucose reference range applies only to samples taken after fasting for at least 8 hours.    Current Facility-Administered Medications  Medication Dose Route Frequency Provider Last Rate Last Admin  . acetaminophen (TYLENOL) tablet 650 mg  650 mg Oral Q6H PRN Jacques Navy, MD   650 mg at 03/03/20 2156   Or  . acetaminophen (TYLENOL) suppository 650 mg  650 mg Rectal Q6H PRN Norins, Rosalyn Gess, MD      . amantadine (SYMMETREL) capsule 100 mg  100 mg Oral BID Theotis BarrioLee, Joshua K, MD   100 mg at 03/10/20 0935  . ARIPiprazole (ABILIFY) tablet 10 mg  10 mg Oral Daily Maryagnes AmosStarkes-Perry, Takia S, FNP   10 mg at 03/10/20 0934  . aspirin EC tablet 81 mg  81 mg Oral Daily Theotis BarrioLee, Joshua K, MD   81 mg at 03/10/20 0934  . atorvastatin (LIPITOR) tablet 40 mg  40 mg Oral Daily Norins, Rosalyn GessMichael E, MD   40 mg at 03/10/20 0934  . benztropine (COGENTIN) tablet 1 mg  1 mg Oral Daily Miguel AschoffWilliams, Julie Anne, MD   1 mg at 03/10/20 0935   And  . benztropine (COGENTIN) tablet 0.5 mg  0.5 mg Oral QHS Miguel AschoffWilliams, Julie Anne, MD   0.5 mg at 03/09/20 2145  . haloperidol (HALDOL) tablet 5 mg  5 mg Oral Q6H PRN Dagar, Geralynn RileAnjali, MD   5 mg at 03/07/20 1622   And  . benztropine (COGENTIN) tablet 1 mg  1 mg Oral Q6H PRN Dagar, Geralynn RileAnjali, MD      . feeding supplement (ENSURE ENLIVE / ENSURE PLUS) liquid 237 mL  237 mL Oral BID BM Dagar, Anjali, MD   237 mL at 03/10/20 0942  . heparin injection 5,000 Units  5,000 Units Subcutaneous Q8H Theotis BarrioLee, Joshua K, MD   5,000 Units at 03/10/20 0630  . insulin aspart (novoLOG) injection 0-15 Units  0-15 Units Subcutaneous TID WC Norins, Rosalyn GessMichael E, MD   3 Units at 03/10/20 1214  . OLANZapine zydis (ZYPREXA) disintegrating tablet 5 mg  5 mg Oral QHS Damiya Sandefur, MD      . pantoprazole (PROTONIX) EC tablet 40 mg  40 mg Oral Daily Theotis BarrioLee, Joshua K, MD   40 mg at  03/10/20 0936  . polyethylene glycol (MIRALAX / GLYCOLAX) packet 17 g  17 g Oral BID Verdene LennertBasaraba, Iulia, MD   17 g at 03/10/20 0936  . senna (SENOKOT) tablet 17.2 mg  2 tablet Oral BID Verdene LennertBasaraba, Iulia, MD   17.2 mg at 03/10/20 0936  . sertraline (ZOLOFT) tablet 50 mg  50 mg Oral Daily Norins, Rosalyn GessMichael E, MD   50 mg at 03/10/20 0934  . traZODone (DESYREL) tablet 300 mg  300 mg Oral QHS Norins, Rosalyn GessMichael E, MD   300 mg at 03/09/20 2145    Musculoskeletal: Strength & Muscle Tone: abnormal and increased Gait & Station: unsteady Patient leans: N/A  Psychiatric Specialty Exam: Physical Exam Psychiatric:        Attention and Perception: Attention normal. She perceives visual hallucinations.        Mood and Affect: Mood normal. Affect is flat.        Speech: Speech is delayed.        Behavior: Behavior normal. Behavior is cooperative.        Thought Content: Thought content normal.        Cognition and Memory: Cognition normal.        Judgment: Judgment normal.     Review of Systems  Psychiatric/Behavioral: Positive for hallucinations.    Blood pressure (!) 145/57, pulse (!) 52, temperature 98.2 F (36.8 C), temperature source Oral, resp. rate 18, weight 67.4 kg, SpO2 100 %.Body mass index is 27.18 kg/m.  General Appearance: Fairly Groomed  Eye Contact:  Fair  Speech:  Clear and Coherent and Slow  Volume:  Normal  Mood:  Anxious  Affect:  Congruent  Thought Process:  Disorganized and Descriptions of Associations: Loose  Orientation:  Other:  Alert and oriented to  self and place  Thought Content:  Illogical, Delusions, Hallucinations: Auditory Visual, Ideas of Reference:   Paranoia and Paranoid Ideation  Suicidal Thoughts:  No  Homicidal Thoughts:  No  Memory:  Immediate;   Fair Recent;   Fair Remote;   Poor  Judgement:  Poor  Insight:  Lacking  Psychomotor Activity:  much improved tremors  Concentration:  Concentration: Fair and Attention Span: Fair  Recall:  Poor  Fund of  Knowledge:  Fair  Language:  Fair  Akathisia:  Yes  Handed:  Right  AIMS (if indicated):     Assets:  Communication Skills Desire for Improvement Financial Resources/Insurance Housing Leisure Time Physical Health  ADL's:  Impaired  Cognition:  Impaired,  Mild  Sleep:        Treatment Plan Summary: -Continue Abilify 10 mg daily for psychosis. -Increase Olanzapine to 5 mg, 1 tablet at bedtime for psychosis.  -Continue 1:1 sitter as deemed necessary by the treatment team. - Continue working closely with SW to facilitate placement at inpatient gero-psych facility.  - TD symptoms have improved:continue Amantadine and Benztropine.    -Continue Abilify 15mg  po daily. WIll decrease Abilify if patient responds to Zyprexa.  -Patient appears to have some mild cognitive impairment, also does not appear to have the capacity to make medical decision due to her current limited mentation.  Disposition: Recommend psychiatric Inpatient admission when medically cleared.  , MD 03/05/2020 3:24PM

## 2020-03-11 LAB — GLUCOSE, CAPILLARY
Glucose-Capillary: 131 mg/dL — ABNORMAL HIGH (ref 70–99)
Glucose-Capillary: 163 mg/dL — ABNORMAL HIGH (ref 70–99)
Glucose-Capillary: 162 mg/dL — ABNORMAL HIGH (ref 70–99)
Glucose-Capillary: 208 mg/dL — ABNORMAL HIGH (ref 70–99)

## 2020-03-11 LAB — BASIC METABOLIC PANEL
Anion gap: 10 (ref 5–15)
BUN: 21 mg/dL (ref 8–23)
CO2: 25 mmol/L (ref 22–32)
Calcium: 9.9 mg/dL (ref 8.9–10.3)
Chloride: 103 mmol/L (ref 98–111)
Creatinine, Ser: 1.45 mg/dL — ABNORMAL HIGH (ref 0.44–1.00)
GFR, Estimated: 41 mL/min — ABNORMAL LOW (ref >60)
Glucose, Bld: 135 mg/dL — ABNORMAL HIGH (ref 70–99)
Potassium: 4.3 mmol/L (ref 3.5–5.1)
Sodium: 138 mmol/L (ref 135–145)

## 2020-03-11 NOTE — Progress Notes (Signed)
Patient continues to have auditory and visual hallucinations. Patient stated she was talking to her "mom", "Kenard Gower"  And a guy named  Gaynelle Adu"

## 2020-03-11 NOTE — Progress Notes (Signed)
   Subjective:   No acute events overnight. [name] denies any complaints this morning. She just had a bed bath and felt well.   Objective:  Vital signs in last 24 hours: Vitals:   03/10/20 0420 03/10/20 0932 03/10/20 1659 03/10/20 2136  BP: (!) 115/44 (!) 145/57 110/87 132/74  Pulse: (!) 45 (!) 52 (!) 43 (!) 48  Resp: 15 18 18 16   Temp: 99 F (37.2 C) 98.2 F (36.8 C) 99.2 F (37.3 C) 98.1 F (36.7 C)  TempSrc: Oral Oral Oral Oral  SpO2: 95% 100% 100% 100%  Weight:    66.3 kg   Physical Exam Vitals and nursing note reviewed.  Constitutional:      General: She is not in acute distress.    Appearance: She is normal weight.  HENT:     Head: Normocephalic and atraumatic.  Pulmonary:     Effort: Pulmonary effort is normal. No respiratory distress.  Neurological:     Mental Status: She is alert.     Motor: Tremor present.  Psychiatric:        Attention and Perception: She perceives auditory and visual hallucinations.        Mood and Affect: Affect normal. Mood is elated.        Speech: Speech normal.        Behavior: Behavior is not agitated or aggressive. Behavior is cooperative.    Assessment/Plan:  Principal Problem:   Paranoid schizophrenia, chronic condition with acute exacerbation (HCC) Active Problems:   AKI (acute kidney injury) (HCC)   DM (diabetes mellitus), type 2 (HCC)   HTN (hypertension), benign   HLD (hyperlipidemia)   Acute kidney injury (HCC)   Hallucinations  Jessica Robinsonis a 64 y.o.with PMH of schizoaffective disorder with paranoia, HTN, HLD, DM, Depressionadmitted for acute kidney injury and visual Hallucinations.  Schizoaffective disorder Patient is seen responding to internal stimuli. Admits to AVH. Denies active suicidal ideations or homicidal ideations. Psychiatry is consulted; medication adjustments were made. Recommendation for inpatient psychiatry; still awaiting bed placement.   - Awaiting geriatric psychiatry placement.   - Greatly appreciate psych assistance - Continue agitation protocol -ContinueAbilify to10mg  daily - Continue Zoloft 50mg  daily - Continue Trazodone 300mg  qHS -Zypreza increased yesterday by psychiatry   Drug-induced extrapyramidal movement disorder Patient's TD is improving .Currently on Cogentin and amantadine.   - C/w home meds: amantadine 100mg  BID, bentropine 1mg  am, 0.5mg  pm  Acute Kidney Injuryon Chronic Kidney Disease stage Creatinine is stable at this time, wonder if this is new baseline.   - Avoid nephrotoxic agents  - Encourage PO intake    Dr. 64 Internal Medicine PGY-2  Pager: [phone] After 5pm on weekdays and 1pm on weekends: On Call pager [phone]  03/11/2020, 6:31 AM

## 2020-03-11 NOTE — Plan of Care (Signed)
  Problem: Education: Goal: Knowledge of General Education information will improve Description Including pain rating scale, medication(s)/side effects and non-pharmacologic comfort measures Outcome: Progressing   

## 2020-03-12 DIAGNOSIS — I129 Hypertensive chronic kidney disease with stage 1 through stage 4 chronic kidney disease, or unspecified chronic kidney disease: Secondary | ICD-10-CM | POA: Diagnosis not present

## 2020-03-12 DIAGNOSIS — N1831 Chronic kidney disease, stage 3a: Secondary | ICD-10-CM | POA: Diagnosis not present

## 2020-03-12 DIAGNOSIS — N179 Acute kidney failure, unspecified: Secondary | ICD-10-CM | POA: Diagnosis not present

## 2020-03-12 DIAGNOSIS — F200 Paranoid schizophrenia: Secondary | ICD-10-CM | POA: Diagnosis not present

## 2020-03-12 LAB — GLUCOSE, CAPILLARY
Glucose-Capillary: 127 mg/dL — ABNORMAL HIGH (ref 70–99)
Glucose-Capillary: 160 mg/dL — ABNORMAL HIGH (ref 70–99)
Glucose-Capillary: 123 mg/dL — ABNORMAL HIGH (ref 70–99)
Glucose-Capillary: 121 mg/dL — ABNORMAL HIGH (ref 70–99)

## 2020-03-12 NOTE — TOC Progression Note (Signed)
Transition of Care [facility]) - Progression Note    Patient Details  Name: Jessica Campbell MRN: [id number] Date of Birth: 14-Nov-1955  Transition of Care [facility]) CM/SW Contact  Okey Dupre Lazaro Arms, LCSW Phone Number: 03/12/2020, 3:33 PM  Clinical Narrative:  Call made to [facility] to determine bed availability and no answer to Intake phone. Call made to reception and was advised that no one available in their intake dept. today to answer calls.         Expected Discharge Plan and Services                                                 Social Determinants of Health (SDOH) Interventions  Patient in need of Inpatient psych placement  Readmission Risk Interventions No flowsheet data found.

## 2020-03-12 NOTE — Progress Notes (Signed)
Occupational Therapy Treatment Patient Details Name: Jessica Campbell MRN: [id number] DOB: 1956/01/05 Today's Date: 03/12/2020    History of present illness Jessica Campbell is a 64 y.o. with PMH of schizoaffective disorder with paranoia, HTN, HLD, DM, depression admitted for hallucinations and durg induced extrapyramidal movement disorder.   OT comments  Pt making progress with functional goals. Pt in recliner upon arrival and participated in ambulation wit RW and HHA to bathroom for toilet transfers min A and toileitng tasks min A, cues for safety and sequencing required. OT will continue to follow acutely to maximize level of function and safety  Follow Up Recommendations  No OT follow up;Other (comment) (inpt psych)    Equipment Recommendations  3 in 1 bedside commode    Recommendations for Other Services      Precautions / Restrictions Precautions Precautions: Fall       Mobility Bed Mobility               General bed mobility comments: up in recliner upon entry  Transfers Overall transfer level: Needs assistance Equipment used: 1 person hand held assist;Rolling walker (2 wheeled) Transfers: Sit to/from Stand Sit to Stand: Min assist         General transfer comment: Mod A to get to full upright position due to R lateral and posterior lean, very impulsive today    Balance Overall balance assessment: Needs assistance Sitting-balance support: Feet supported Sitting balance-Leahy Scale: Fair Sitting balance - Comments: supervision for safety at EOB Postural control: Posterior lean;Right lateral lean Standing balance support: Single extremity supported;During functional activity Standing balance-Leahy Scale: Poor Standing balance comment: pt needing up to MOD A during transitions during ambulation                           ADL either performed or assessed with clinical judgement   ADL Overall ADL's : Needs assistance/impaired Eating/Feeding: Set  up;Sitting   Grooming: Wash/dry face;Oral care;Min guard;Standing;Wash/dry Lawyer: Ambulation;Cueing for safety;Cueing for sequencing;Minimal assistance   Toileting- Architect and Hygiene: Sit to/from stand;Cueing for safety;Minimal assistance       Functional mobility during ADLs: Moderate assistance;Minimal assistance       Vision Baseline Vision/History: Wears glasses Wears Glasses: Reading only Patient Visual Report: No change from baseline     Perception     Praxis      Cognition Arousal/Alertness: Awake/alert Behavior During Therapy: Impulsive Overall Cognitive Status: Impaired/Different from baseline Area of Impairment: Attention;Following commands;Awareness;Problem solving;Safety/judgement                 Orientation Level: Disoriented to;Situation;Time;Place   Memory: Decreased short-term memory;Decreased recall of precautions Following Commands: Follows one step commands consistently;Follows one step commands with increased time Safety/Judgement: Decreased awareness of safety;Decreased awareness of deficits   Problem Solving: Slow processing;Requires verbal cues;Difficulty sequencing          Exercises     Shoulder Instructions       General Comments sister present during session    Pertinent Vitals/ Pain       Pain Assessment: No/denies pain Pain Score: 0-No pain Faces Pain Scale: No hurt  Home Living  Prior Functioning/Environment              Frequency  Min 2X/week        Progress Toward Goals  OT Goals(current goals can now be found in the care plan section)  Progress towards OT goals: Progressing toward goals  Acute Rehab OT Goals Patient Stated Goal: No goals stated  Plan Discharge plan remains appropriate    Co-evaluation                 AM-PAC OT "6 Clicks" Daily Activity     Outcome  Measure   Help from another person eating meals?: None Help from another person taking care of personal grooming?: A Little Help from another person toileting, which includes using toliet, bedpan, or urinal?: A Little Help from another person bathing (including washing, rinsing, drying)?: A Little Help from another person to put on and taking off regular upper body clothing?: A Little Help from another person to put on and taking off regular lower body clothing?: A Little 6 Click Score: 19    End of Session Equipment Utilized During Treatment: Gait belt;Rolling walker  OT Visit Diagnosis: Unsteadiness on feet (R26.81);History of falling (Z91.81);Other symptoms and signs involving cognitive function   Activity Tolerance Patient tolerated treatment well   Patient Left in chair;with call bell/phone within reach;with chair alarm set   Nurse Communication          Time: [id number] OT Time Calculation (min): 25 min  Charges: OT General Charges $OT Visit: 1 Visit OT Treatments $Self Care/Home Management : 8-22 mins $Therapeutic Activity: 8-22 mins     Galen Manila 03/12/2020, 3:45 PM

## 2020-03-12 NOTE — Progress Notes (Signed)
   Subjective: HD#13 No acute overnight events. Patient evaluated at bedside this morning. She states she heard voices earlier and her mother & little boy visited her a while ago. States does not like food here and that's why feels reluctant to eat her food.   Objective:  Vital signs in last 24 hours: Vitals:   03/11/20 1418 03/11/20 2016 03/12/20 0730 03/12/20 0908  BP: 119/79 118/80 112/85 (!) 150/103  Pulse: (!) 54 99 (!) 55 (!) 54  Resp: 16 17 18 19   Temp: 98.4 F (36.9 C) 99.2 F (37.3 C) (!) 97.5 F (36.4 C) 98 F (36.7 C)  TempSrc: Oral Oral Oral   SpO2: 98% 99% 94% 100%  Weight:       Physical exam: General: Well developed, well nourished female sitting comfortably in chair, NAD Neuro: AAO*3 Psych:Admits to auditory and visual hallucinations. Seems like responding to the internal stimuli  Assessment/Plan: Jessica Robinsonis a 64 y.o.with PMH of schizoaffective disorder with paranoia, HTN, HLD, DM, Depressionadmitted for acute kidney injury and visual Hallucinations.  Principal Problem:   Paranoid schizophrenia, chronic condition with acute exacerbation (HCC) Active Problems:   AKI (acute kidney injury) (HCC)   DM (diabetes mellitus), type 2 (HCC)   HTN (hypertension), benign   HLD (hyperlipidemia)   Acute kidney injury (HCC)   Hallucinations  Schizoaffective disorder Patient admits to auditory and visual hallucinations. She seems paranoid and is seen responding to the internal stimuli. Denies active suicidal ideations or homicidal ideations. Psychiatry is consulted; medication adjustments were made. Recommendation for inpatient psychiatry; still awaiting bed placement.   - Awaiting geriatric psychiatry placement.  - Greatly appreciate psych assistance - Continue agitation protocol -ContinueAbilify to10mg  daily - Continue Zoloft 50mg  daily - Continue Trazodone 300mg  qHS -Zypreza 5 mg   Drug-induced extrapyramidal movement disorder Patient's TD is  improving .Currently on Cogentin and amantadine.   - C/w home meds: amantadine 100mg  BID, bentropine 1mg  am, 0.5mg  pm  Acute Kidney Injuryon Chronic Kidney Disease stage Creatinine is stable at this time, wonder if this is new baseline.   - Avoid nephrotoxic agents  - Encourage PO intake  - Regular diet - Ensure supplement  Prior to Admission Living Arrangement:Home Anticipated Discharge Location:Inpatient psychiatry Barriers to Discharge:Disposition Dispo:Pending Disposition  Jessica Campbell, 65, MD 03/12/2020, 11:23 AM Pager: [phone] After 5pm on weekdays and 1pm on weekends: On Call pager [phone]

## 2020-03-12 NOTE — Progress Notes (Signed)
Physical Therapy Treatment Patient Details Name: Jessica Campbell MRN: [id number] DOB: [birth year] Today's Date: 03/12/2020    History of Present Illness Jessica Campbell is a 64 y.o. with PMH of schizoaffective disorder with paranoia, HTN, HLD, DM, depression admitted for hallucinations and durg induced extrapyramidal movement disorder.    PT Comments    Patient received in recliner with sister present, much more unsteady and restless today, also seems to be having some back pain as well. Able to perform functional transfers and walk to and from doorway but gait limited due to increased back pain and unsteadiness today- just able to tolerate less activity this session. Had strong posterior LOB in hallway. Left up in recliner with sister present, chair alarm active, RN aware of patient status. Will continue to follow acutely.     Follow Up Recommendations  Other (comment) (geri psych)     Equipment Recommendations  Rolling walker with 5" wheels    Recommendations for Other Services       Precautions / Restrictions Precautions Precautions: Fall Restrictions Weight Bearing Restrictions: No    Mobility  Bed Mobility               General bed mobility comments: up in recliner upon entry  Transfers Overall transfer level: Needs assistance Equipment used: 1 person hand held assist Transfers: Sit to/from Stand Sit to Stand: Mod assist         General transfer comment: ModA to get to full upright position due to R lateral and posterior lean, very impulsive today  Ambulation/Gait Ambulation/Gait assistance: Mod assist Gait Distance (Feet): 20 Feet Assistive device: 1 person hand held assist Gait Pattern/deviations: Step-through pattern;Decreased stride length;Drifts right/left;Scissoring;Narrow base of support Gait velocity: decreased   General Gait Details: much more unsteady and with heavier posterior lean today, slower than last time she was seen and also seemed to  fatigue quicker. Gait distance limited due to pain and sudden increase in amount of unsteadiness   Stairs             Wheelchair Mobility    Modified Rankin (Stroke Patients Only)       Balance Overall balance assessment: Needs assistance Sitting-balance support: Feet supported Sitting balance-Leahy Scale: Fair Sitting balance - Comments: supervision for safety at EOB Postural control: Posterior lean;Right lateral lean Standing balance support: Single extremity supported;During functional activity Standing balance-Leahy Scale: Poor Standing balance comment: pt needing up to MOD A during transitions during ambulation                            Cognition Arousal/Alertness: Awake/alert Behavior During Therapy: Restless;Anxious;Impulsive Overall Cognitive Status: Impaired/Different from baseline Area of Impairment: Attention;Following commands;Awareness;Problem solving;Safety/judgement                 Orientation Level: Disoriented to;Situation;Time;Place Current Attention Level: Sustained Memory: Decreased short-term memory;Decreased recall of precautions Following Commands: Follows one step commands consistently;Follows one step commands with increased time Safety/Judgement: Decreased awareness of safety;Decreased awareness of deficits Awareness: Intellectual Problem Solving: Slow processing;Requires verbal cues;Difficulty sequencing General Comments: much more restless, impulsive and anxious today; seemed more uncomfortable due to back pain.      Exercises      General Comments General comments (skin integrity, edema, etc.): sister present during session      Pertinent Vitals/Pain Pain Assessment: Faces Faces Pain Scale: Hurts little more Pain Location: back Pain Descriptors / Indicators: Aching;Discomfort Pain Intervention(s): Limited activity within patient's tolerance;Monitored during session  Home Living                       Prior Function            PT Goals (current goals can now be found in the care plan section) Acute Rehab PT Goals Patient Stated Goal: No goals stated PT Goal Formulation: With patient Time For Goal Achievement: 03/13/20 Potential to Achieve Goals: Good Progress towards PT goals: Not progressing toward goals - comment (more restless/impulsive/unsteady today, pain)    Frequency    Min 2X/week      PT Plan Current plan remains appropriate    Co-evaluation              AM-PAC PT "6 Clicks" Mobility   Outcome Measure  Help needed turning from your back to your side while in a flat bed without using bedrails?: None Help needed moving from lying on your back to sitting on the side of a flat bed without using bedrails?: None Help needed moving to and from a bed to a chair (including a wheelchair)?: A Lot Help needed standing up from a chair using your arms (e.g., wheelchair or bedside chair)?: A Lot Help needed to walk in hospital room?: A Lot Help needed climbing 3-5 steps with a railing? : Total 6 Click Score: 15    End of Session Equipment Utilized During Treatment: Gait belt Activity Tolerance: Patient tolerated treatment well Patient left: in chair;with call bell/phone within reach;with nursing/sitter in room Nurse Communication: Mobility status PT Visit Diagnosis: Unsteadiness on feet (R26.81);History of falling (Z91.81);Other abnormalities of gait and mobility (R26.89)     Time: 1130-1140 PT Time Calculation (min) (ACUTE ONLY): 10 min  Charges:  $Gait Training: 8-22 mins                     Madelaine Etienne, DPT, PN1   Supplemental Physical Therapist [facility]    Pager [phone] Acute Rehab Office [phone]

## 2020-03-12 NOTE — Progress Notes (Signed)
Pt just went to sleep.   Larey Days, RN

## 2020-03-13 DIAGNOSIS — K5900 Constipation, unspecified: Secondary | ICD-10-CM

## 2020-03-13 DIAGNOSIS — I129 Hypertensive chronic kidney disease with stage 1 through stage 4 chronic kidney disease, or unspecified chronic kidney disease: Secondary | ICD-10-CM | POA: Diagnosis not present

## 2020-03-13 DIAGNOSIS — N179 Acute kidney failure, unspecified: Secondary | ICD-10-CM | POA: Diagnosis not present

## 2020-03-13 DIAGNOSIS — N1831 Chronic kidney disease, stage 3a: Secondary | ICD-10-CM | POA: Diagnosis not present

## 2020-03-13 LAB — GLUCOSE, CAPILLARY
Glucose-Capillary: 119 mg/dL — ABNORMAL HIGH (ref 70–99)
Glucose-Capillary: 141 mg/dL — ABNORMAL HIGH (ref 70–99)
Glucose-Capillary: 100 mg/dL — ABNORMAL HIGH (ref 70–99)
Glucose-Capillary: 167 mg/dL — ABNORMAL HIGH (ref 70–99)

## 2020-03-13 MED ORDER — OLANZAPINE 5 MG PO TABS
5.0000 mg | ORAL_TABLET | Freq: Every day | ORAL | Status: DC
  Administered 2020-03-13 – 2020-03-14 (×2): 5 mg via ORAL
  Filled 2020-03-13 (×2): qty 1

## 2020-03-13 NOTE — Consult Note (Signed)
[facility] Face-to-Face Psychiatry Consult   Reason for Consult:"Schizoaffective Disorder.'' Referring Physician:  Reymundo Poll, MD Patient Identification: Jessica Campbell MRN:  [id number] Principal Diagnosis: Paranoid schizophrenia, chronic condition with acute exacerbation (HCC) Diagnosis:  Principal Problem:   Paranoid schizophrenia, chronic condition with acute exacerbation (HCC) Active Problems:   AKI (acute kidney injury) (HCC)   DM (diabetes mellitus), type 2 (HCC)   HTN (hypertension), benign   HLD (hyperlipidemia)   Acute kidney injury (HCC)   Hallucinations   Total Time spent with patient: 30 minutes  Subjective: ''Im ok.'' Objective:  64 y.o. female with history of paranoid schizophrenia, who presented with decompensation of her schizophrenia. She continues on Abilify 10mg  po daily, Olanzapine zydis 5mg  po qhs, Amantadine 100mg  po BID, sertraline 50mg  and Trazodone 300mg  po qhs. She reports improvement in her sleep and overall symptoms. During the evaluation she is observed to be having difficult finding her words. She denies any hallucinations at this time, and does not appear to be responding to internal stimuli. She denies any suicidal thoughts.    Past Psychiatric History: Paranoid Schizophrenia. Has tried multiple antipsychotics to include both oral and injectable. Recently she has had multiple psychiatric admissions for her worsening hallucinations and paranoia. She denies any suicide attempts.   Risk to Self:   Denies Risk to Others:   Denies Prior Inpatient Therapy:   Yes, recently 09/20/2019 at [facility] Prior Outpatient Therapy:   [facility]   Past Medical History:  Past Medical History:  Diagnosis Date  . Borderline diabetes   . Depression   . Diabetes mellitus without complication (HCC)   . High cholesterol   . Hypertension   . Schizoaffective disorder (HCC)   . Scoliosis     Past Surgical History:  Procedure Laterality Date  . ABDOMINAL HYSTERECTOMY     Family  History: No family history on file. Family Psychiatric  History: Unable to assess Social History:  Social History   Substance and Sexual Activity  Alcohol Use No     Social History   Substance and Sexual Activity  Drug Use No    Social History   Socioeconomic History  . Marital status: Single    Spouse name: Not on file  . Number of children: Not on file  . Years of education: Not on file  . Highest education level: Not on file  Occupational History  . Not on file  Tobacco Use  . Smoking status: Never Smoker  . Smokeless tobacco: Never Used  Substance and Sexual Activity  . Alcohol use: No  . Drug use: No  . Sexual activity: Not on file  Other Topics Concern  . Not on file  Social History Narrative  . Not on file   Social Determinants of Health   Financial Resource Strain:   . Difficulty of Paying Living Expenses: Not on file  Food Insecurity:   . Worried About in the Last Year: Not on file  . Ran Out of Food in the Last Year: Not on file  Transportation Needs:   . Lack of Transportation (Medical): Not on file  . Lack of Transportation (Non-Medical): Not on file  Physical Activity:   . Days of Exercise per Week: Not on file  . Minutes of Exercise per Session: Not on file  Stress:   . Feeling of Stress : Not on file  Social Connections:   . Frequency of Communication with Friends and Family: Not on file  . Frequency of Social Gatherings  with Friends and Family: Not on file  . Attends Religious Services: Not on file  . Active Member of Clubs or Organizations: Not on file  . Attends BankerClub or Organization Meetings: Not on file  . Marital Status: Not on file   Additional Social History:    Allergies:   Allergies  Allergen Reactions  . Paliperidone     Other reaction(s): Hives / Skin Rash  . Quetiapine Fumarate Rash    Other reaction(s): Hives / Skin Rash  . Risperidone Rash    Other reaction(s): Hives / Skin Rash  . Diphenhydramine  Hcl     Other reaction(s): Difficulty breathing, Hives / Skin Rash  . Latex Rash    Other reaction(s): Rash, Rash Other reaction(s): DERMATITIS Other reaction(s): DERMATITIS     Labs:  Results for orders placed or performed during the hospital encounter of 02/27/20 (from the past 48 hour(s))  Glucose, capillary     Status: Abnormal   Collection Time: 03/11/20 11:34 AM  Result Value Ref Range   Glucose-Capillary 163 (H) 70 - 99 mg/dL    Comment: Glucose reference range applies only to samples taken after fasting for at least 8 hours.  Glucose, capillary     Status: Abnormal   Collection Time: 03/11/20  4:49 PM  Result Value Ref Range   Glucose-Capillary 162 (H) 70 - 99 mg/dL    Comment: Glucose reference range applies only to samples taken after fasting for at least 8 hours.  Glucose, capillary     Status: Abnormal   Collection Time: 03/11/20  9:43 PM  Result Value Ref Range   Glucose-Capillary 208 (H) 70 - 99 mg/dL    Comment: Glucose reference range applies only to samples taken after fasting for at least 8 hours.  Glucose, capillary     Status: Abnormal   Collection Time: 03/12/20  7:50 AM  Result Value Ref Range   Glucose-Capillary 127 (H) 70 - 99 mg/dL    Comment: Glucose reference range applies only to samples taken after fasting for at least 8 hours.  Glucose, capillary     Status: Abnormal   Collection Time: 03/12/20 11:28 AM  Result Value Ref Range   Glucose-Capillary 160 (H) 70 - 99 mg/dL    Comment: Glucose reference range applies only to samples taken after fasting for at least 8 hours.  Glucose, capillary     Status: Abnormal   Collection Time: 03/12/20  4:30 PM  Result Value Ref Range   Glucose-Capillary 123 (H) 70 - 99 mg/dL    Comment: Glucose reference range applies only to samples taken after fasting for at least 8 hours.  Glucose, capillary     Status: Abnormal   Collection Time: 03/12/20  9:15 PM  Result Value Ref Range   Glucose-Capillary 121 (H) 70 -  99 mg/dL    Comment: Glucose reference range applies only to samples taken after fasting for at least 8 hours.  Glucose, capillary     Status: Abnormal   Collection Time: 03/13/20  8:02 AM  Result Value Ref Range   Glucose-Capillary 119 (H) 70 - 99 mg/dL    Comment: Glucose reference range applies only to samples taken after fasting for at least 8 hours.    Current Facility-Administered Medications  Medication Dose Route Frequency Provider Last Rate Last Admin  . acetaminophen (TYLENOL) tablet 650 mg  650 mg Oral Q6H PRN Jacques NavyNorins, Michael E, MD   650 mg at 03/03/20 2156   Or  . acetaminophen (  TYLENOL) suppository 650 mg  650 mg Rectal Q6H PRN Norins, Rosalyn Gess, MD      . amantadine (SYMMETREL) capsule 100 mg  100 mg Oral BID Theotis Barrio, MD   100 mg at 03/12/20 2145  . ARIPiprazole (ABILIFY) tablet 10 mg  10 mg Oral Daily Maryagnes Amos, FNP   10 mg at 03/12/20 0855  . aspirin EC tablet 81 mg  81 mg Oral Daily Theotis Barrio, MD   81 mg at 03/12/20 0855  . atorvastatin (LIPITOR) tablet 40 mg  40 mg Oral Daily Norins, Rosalyn Gess, MD   40 mg at 03/12/20 0855  . benztropine (COGENTIN) tablet 1 mg  1 mg Oral Daily Miguel Aschoff, MD   1 mg at 03/12/20 6812   And  . benztropine (COGENTIN) tablet 0.5 mg  0.5 mg Oral QHS Miguel Aschoff, MD   0.5 mg at 03/12/20 2145  . haloperidol (HALDOL) tablet 5 mg  5 mg Oral Q6H PRN Dagar, Geralynn Rile, MD   5 mg at 03/11/20 1427   And  . benztropine (COGENTIN) tablet 1 mg  1 mg Oral Q6H PRN Dagar, Geralynn Rile, MD   1 mg at 03/11/20 1804  . feeding supplement (ENSURE ENLIVE / ENSURE PLUS) liquid 237 mL  237 mL Oral BID BM Dagar, Anjali, MD   237 mL at 03/12/20 1600  . heparin injection 5,000 Units  5,000 Units Subcutaneous Q8H Theotis Barrio, MD   5,000 Units at 03/13/20 0525  . insulin aspart (novoLOG) injection 0-15 Units  0-15 Units Subcutaneous TID WC Norins, Rosalyn Gess, MD   2 Units at 03/12/20 1845  . OLANZapine zydis (ZYPREXA) disintegrating tablet  5 mg  5 mg Oral QHS Akintayo, Mojeed, MD   5 mg at 03/12/20 2144  . pantoprazole (PROTONIX) EC tablet 40 mg  40 mg Oral Daily Theotis Barrio, MD   40 mg at 03/12/20 0855  . polyethylene glycol (MIRALAX / GLYCOLAX) packet 17 g  17 g Oral BID Verdene Lennert, MD   17 g at 03/12/20 2144  . senna (SENOKOT) tablet 17.2 mg  2 tablet Oral BID Verdene Lennert, MD   17.2 mg at 03/12/20 2144  . sertraline (ZOLOFT) tablet 50 mg  50 mg Oral Daily Norins, Rosalyn Gess, MD   50 mg at 03/12/20 0855  . traZODone (DESYREL) tablet 300 mg  300 mg Oral QHS Reymundo Poll, MD   300 mg at 03/12/20 2144    Musculoskeletal: Strength & Muscle Tone: abnormal and increased Gait & Station: unsteady Patient leans: N/A  Psychiatric Specialty Exam: Physical Exam Psychiatric:        Attention and Perception: Attention normal. She perceives visual hallucinations.        Mood and Affect: Mood normal. Affect is flat.        Speech: Speech is delayed.        Behavior: Behavior normal. Behavior is cooperative.        Thought Content: Thought content normal.        Cognition and Memory: Cognition normal.        Judgment: Judgment normal.     Review of Systems  Psychiatric/Behavioral: Positive for hallucinations.    Blood pressure (!) 116/94, pulse 94, temperature 98.2 F (36.8 C), temperature source Oral, resp. rate 17, weight 66.3 kg, SpO2 100 %.Body mass index is 26.73 kg/m.  General Appearance: Fairly Groomed  Eye Contact:  Fair  Speech:  Clear and Coherent and Slow  Volume:  Normal  Mood:  Anxious  Affect:  Congruent  Thought Process:  Coherent, Irrelevant, Linear and Descriptions of Associations: Intact  Orientation:  Other:  alert and oriented x 2  Thought Content:  Illogical, Rumination and Tangential  Suicidal Thoughts:  No  Homicidal Thoughts:  No  Memory:  Immediate;   Fair Recent;   Fair Remote;   Poor  Judgement:  Other:  Improved  Insight:  Shallow  Psychomotor Activity:  much improved tremors   Concentration:  Concentration: Fair and Attention Span: Fair  Recall:  [employer] of Knowledge:  Fair  Language:  Fair  Akathisia:  No  Handed:  Right  AIMS (if indicated):     Assets:  Communication Skills Desire for Improvement Financial Resources/Insurance Housing Leisure Time Physical Health  ADL's:  Impaired  Cognition:  Impaired,  Mild  Sleep:        Treatment Plan Summary: -Continue Abilify 10 mg daily for psychosis. -Will switch from olanzapine zydis to Olanzapine 5 mg, 1 tablet at bedtime for psychosis.  -Continue 1:1 sitter as deemed necessary by the treatment team. - Continue working closely with SW to facilitate placement at inpatient gero-psych facility.  - TD symptoms have improved:continue Amantadine and Benztropine.     -Patient appears to have some mild cognitive impairment, also does not appear to have the capacity to make medical decision due to her current limited mentation.  Disposition: Recommend psychiatric Inpatient admission when medically cleared.  Maryagnes Amos, FNP 03/05/2020 3:24PM

## 2020-03-13 NOTE — Plan of Care (Signed)
  Problem: Education: Goal: Knowledge of General Education information will improve Description: Including pain rating scale, medication(s)/side effects and non-pharmacologic comfort measures Outcome: Not Progressing   

## 2020-03-13 NOTE — Progress Notes (Signed)
   Subjective: HD#14 Patient evaluated at bedside this AM. She was eating her breakfast and enjoying her food much more. She says she feels cold. She says she hears voices telling her to hurt herself or others but she says she would never hurt herself or anyone else. She endorses abdominal pain that has always been there that is improving.   Objective:  Vital signs in last 24 hours: Vitals:   03/12/20 1626 03/12/20 2117 03/13/20 0515 03/13/20 0950  BP: (!) 142/116 (!) 148/98 140/83 (!) 116/94  Pulse: (!) 51 (!) 58 (!) 52 94  Resp: 20 18 17    Temp: 97.7 F (36.5 C) 98 F (36.7 C) 98.3 F (36.8 C) 98.2 F (36.8 C)  TempSrc:  Oral Oral Oral  SpO2: 100% 100% 100% 100%  Weight:       General: Well developed, well nourished female sitting comfortably in chair, NAD Neuro: AAO*2, oriented to place & person Psych:Admits to auditory and visual hallucinations. Seems like responding to the internal stimuli, word finding difficulty  Assessment/Plan: Jessica Robinsonis a 64 y.o.with PMH of schizoaffective disorder with paranoia, HTN, HLD, DM, Depressionadmitted for acute kidney injury, improved now and Schizoaffective disorder  Principal Problem:   Paranoid schizophrenia, chronic condition with acute exacerbation (HCC) Active Problems:   AKI (acute kidney injury) (HCC)   DM (diabetes mellitus), type 2 (HCC)   HTN (hypertension), benign   HLD (hyperlipidemia)   Acute kidney injury (HCC)   Hallucinations  Schizoaffective disorder Patient admits to auditory hallucinations, stating they are telling her to hurt somebody she does not know. Admits to visual hallucinations. Seems paranoid and hypervigilant. Denies suicidal and homicidal ideations. Had thought blocking and difficulty findings words today. Psychiatry is consulted again today: They recommended 1:1 sitter and inpatient psychiatry.  - Awaiting geriatric psychiatry placement.  - Greatly appreciate psych assistance - Continue  agitation protocol -ContinueAbilify to10mg  daily - Continue Zoloft 50mg  daily - Continue Trazodone 300mg  qHS -Zypreza 5 mg QHS -1:1 sitter  Drug-induced extrapyramidal movement disorder Patient's TD is improving .Currently on Cogentin and amantadine.   - C/w home meds: amantadine 100mg  BID, bentropine 1mg  am, 0.5mg  pm  Acute Kidney Injuryon Chronic Kidney Disease stage Creatinine is stable at this time, wonder if this is new baseline.  - Avoid nephrotoxic agents  - Encourage PO intake  - Regular diet - Ensure supplement  Constipation-Improved Patient is on bowel regimen due to constipation- Miralax BID and Senakot BID. But she had not had a bowel movement in over week and received soap suds enema yesterday and had a significant bowel movement yesterday.   - Miralax BID -Senokot BID  Prior to Admission Living Arrangement:Home Anticipated Discharge Location:Inpatient psychiatry Barriers to Discharge:Disposition Dispo:Pending Disposition  Jessica Campbell, 65, MD 03/13/2020, 1:35 PM Pager: [phone] After 5pm on weekdays and 1pm on weekends: On Call pager [phone]

## 2020-03-13 NOTE — TOC Progression Note (Addendum)
Transition of Care [facility]) - Progression Note    Patient Details  Name: Kharisma Glasner MRN: [id number] Date of Birth: [birth year]  [facility]) CM/SW Contact  Okey Dupre Lazaro Arms, LCSW Phone Number: 03/13/2020, 4:59 PM  Clinical Narrative:  Continuing to seek psych placement for patient. CSW unable to speak with an intake worker at [facility]. CSW referred to [place] at [facility] and was advised to secure chat Dr. Mordecai Rasmussen and Wells Guiles regarding patient. Secure chat done and was seen by both persons. CSW will continue to follow.       Expected Discharge Plan and Services                                                 Social Determinants of Health (SDOH) Interventions  Patient in need of psychiatric placement.  Readmission Risk Interventions No flowsheet data found.

## 2020-03-13 NOTE — Progress Notes (Signed)
Patient awake most of the night. Told patient to get some sleep.

## 2020-03-14 DIAGNOSIS — N179 Acute kidney failure, unspecified: Secondary | ICD-10-CM | POA: Diagnosis not present

## 2020-03-14 DIAGNOSIS — N1831 Chronic kidney disease, stage 3a: Secondary | ICD-10-CM | POA: Diagnosis not present

## 2020-03-14 DIAGNOSIS — R410 Disorientation, unspecified: Secondary | ICD-10-CM

## 2020-03-14 DIAGNOSIS — I129 Hypertensive chronic kidney disease with stage 1 through stage 4 chronic kidney disease, or unspecified chronic kidney disease: Secondary | ICD-10-CM | POA: Diagnosis not present

## 2020-03-14 DIAGNOSIS — F200 Paranoid schizophrenia: Secondary | ICD-10-CM | POA: Diagnosis not present

## 2020-03-14 LAB — BASIC METABOLIC PANEL
Anion gap: 10 (ref 5–15)
BUN: 23 mg/dL (ref 8–23)
CO2: 26 mmol/L (ref 22–32)
Calcium: 10.1 mg/dL (ref 8.9–10.3)
Chloride: 105 mmol/L (ref 98–111)
Creatinine, Ser: 1.4 mg/dL — ABNORMAL HIGH (ref 0.44–1.00)
GFR, Estimated: 42 mL/min — ABNORMAL LOW (ref >60)
Glucose, Bld: 117 mg/dL — ABNORMAL HIGH (ref 70–99)
Potassium: 4.1 mmol/L (ref 3.5–5.1)
Sodium: 141 mmol/L (ref 135–145)

## 2020-03-14 LAB — GLUCOSE, CAPILLARY
Glucose-Capillary: 119 mg/dL — ABNORMAL HIGH (ref 70–99)
Glucose-Capillary: 139 mg/dL — ABNORMAL HIGH (ref 70–99)
Glucose-Capillary: 121 mg/dL — ABNORMAL HIGH (ref 70–99)
Glucose-Capillary: 147 mg/dL — ABNORMAL HIGH (ref 70–99)

## 2020-03-14 MED ORDER — HALOPERIDOL 5 MG PO TABS
5.0000 mg | ORAL_TABLET | Freq: Four times a day (QID) | ORAL | Status: DC | PRN
  Administered 2020-03-14 (×2): 5 mg via ORAL
  Filled 2020-03-14 (×3): qty 1

## 2020-03-14 MED ORDER — LORAZEPAM 2 MG/ML IJ SOLN
1.0000 mg | Freq: Four times a day (QID) | INTRAMUSCULAR | Status: DC | PRN
  Administered 2020-03-14: 1 mg via INTRAMUSCULAR

## 2020-03-14 MED ORDER — BENZTROPINE MESYLATE 1 MG PO TABS
1.0000 mg | ORAL_TABLET | Freq: Four times a day (QID) | ORAL | Status: DC | PRN
  Filled 2020-03-14: qty 1

## 2020-03-14 MED ORDER — LORAZEPAM 2 MG/ML IJ SOLN
1.0000 mg | Freq: Four times a day (QID) | INTRAMUSCULAR | Status: DC | PRN
  Filled 2020-03-14: qty 1

## 2020-03-14 NOTE — Progress Notes (Signed)
Spoke with Dr. Huel Cote regarding patient being very agitated and being jittery, to see if could increase frequency of haldol. She would review the patients chart and get back with me.  Alaysha Jefcoat, RN

## 2020-03-14 NOTE — Progress Notes (Signed)
Occupational Therapy Treatment Patient Details Name: Jessica Campbell MRN: [id number] DOB: January 19, 1956 Today's Date: 03/14/2020    History of present illness Jessica Campbell is a 64 y.o. with PMH of schizoaffective disorder with paranoia, HTN, HLD, DM, depression admitted for hallucinations and durg induced extrapyramidal movement disorder.   OT comments  Pt making gradual progress towards OT goals this session. Pt supine in bed speaking unintelligibly with safety sitter present. Pt able to state name but makes no other effort to verbalize needs during session. Pt able to transition from supine>sitting MOD I with increased time, however noted impaired sitting balance once EOB as pt needed at least min guard for safety d/t posterior lean. Attempted simple table top activity where pt instructed to locate shapes on paper. Pt distracted by external stimuli, suspect visual/ auditory hallucination? Unable to reorient pt back to task. Deferred further mobility or ADL tasks d/t decreased ability to follow commands. DC plan remains appropriate, will follow acutely per POC.    Follow Up Recommendations  No OT follow up;Other (comment) (inpt psych)    Equipment Recommendations  3 in 1 bedside commode    Recommendations for Other Services      Precautions / Restrictions Precautions Precautions: Fall Precaution Comments: visual and auditory hallucinations Restrictions Weight Bearing Restrictions: No       Mobility Bed Mobility Overal bed mobility: Needs Assistance Bed Mobility: Supine to Sit;Sit to Supine     Supine to sit: Modified independent (Device/Increase time) Sit to supine: Mod assist   General bed mobility comments: pt able to come to EOB MOD I with use of rails and increased time, cues needed to scoot hips to EOB to place feet on floor. Pt requried MOD A to return to supine needing assist to elevate BLEs as pt noted to return UB to supine but unable to problem solve elevating  BLEs  Transfers                 General transfer comment: defer this session d/t inability to follow commands    Balance Overall balance assessment: Needs assistance Sitting-balance support: Feet supported;Single extremity supported;Bilateral upper extremity supported Sitting balance-Leahy Scale: Poor Sitting balance - Comments: minguard for static sitting                                   ADL either performed or assessed with clinical judgement   ADL Overall ADL's : Needs assistance/impaired                                     Functional mobility during ADLs: Moderate assistance (bed mobility only) General ADL Comments: pt with decreased attention to task this session, unable to follow commands realted to tabel top taske, very distracted by what seeemed to be visual/ auditory hallucinations     Vision       Perception     Praxis      Cognition Arousal/Alertness: Awake/alert Behavior During Therapy: Restless (constant "jittering" and shaking) Overall Cognitive Status: Impaired/Different from baseline Area of Impairment: Orientation;Attention;Memory;Following commands;Safety/judgement;Awareness;Problem solving                 Orientation Level: Disoriented to;Place;Time;Situation (does approrpriately state full name) Current Attention Level: Focused Memory: Decreased short-term memory Following Commands: Follows one step commands inconsistently Safety/Judgement: Decreased awareness of safety;Decreased awareness of deficits Awareness: Intellectual (unaware  that pt was leaning back posteriorly) Problem Solving: Slow processing;Requires verbal cues;Difficulty sequencing;Decreased initiation;Requires tactile cues General Comments: pt much more restless and unable to follow commands realted to table top task where pt instructed to point to shapes on paper. pt distracted by what seeemed to be visual and auditory hallucinations but  unablw to verbalize what she is seeing in comparision to previous sesssion where pt able to verbalize needs and hallucinations. unable to state location despite choices given. pt more restless reaching out for unknown items and fiddling with sheets and bed side table        Exercises     Shoulder Instructions       General Comments pt now with 1:1 sitter    Pertinent Vitals/ Pain       Pain Assessment: Faces Faces Pain Scale: Hurts a little bit Pain Location: when asked about pain pt points to her face but unable to verbalize location or pain descriptors Pain Descriptors / Indicators: Discomfort Pain Intervention(s): Limited activity within patient's tolerance;Monitored during session  Home Living                                          Prior Functioning/Environment              Frequency  Min 2X/week        Progress Toward Goals  OT Goals(current goals can now be found in the care plan section)  Progress towards OT goals: Progressing toward goals  Acute Rehab OT Goals Patient Stated Goal: No goals stated OT Goal Formulation: With patient/family Time For Goal Achievement: 03/16/20 Potential to Achieve Goals: Good  Plan Discharge plan remains appropriate;Frequency remains appropriate    Co-evaluation                 AM-PAC OT "6 Clicks" Daily Activity     Outcome Measure   Help from another person eating meals?: None Help from another person taking care of personal grooming?: A Lot Help from another person toileting, which includes using toliet, bedpan, or urinal?: A Lot Help from another person bathing (including washing, rinsing, drying)?: A Lot Help from another person to put on and taking off regular upper body clothing?: A Lot Help from another person to put on and taking off regular lower body clothing?: A Lot 6 Click Score: 14    End of Session    OT Visit Diagnosis: Unsteadiness on feet (R26.81);History of falling  (Z91.81);Other symptoms and signs involving cognitive function   Activity Tolerance Patient tolerated treatment well   Patient Left in bed;with call bell/phone within reach;with nursing/sitter in room   Nurse Communication Mobility status        Time: 6962-9528 OT Time Calculation (min): 14 min  Charges: OT General Charges $OT Visit: 1 Visit OT Treatments $Therapeutic Activity: 8-22 mins  Audery Amel., COTA/L Acute Rehabilitation Services [phone]    Angelina Pih 03/14/2020, 1:48 PM

## 2020-03-14 NOTE — TOC Progression Note (Signed)
Transition of Care [facility]) - Progression Note    Patient Details  Name: Melayna Robarts MRN: [id number] Date of Birth: 1955/11/14  Transition of [facility]) CM/SW Contact  Okey Dupre Lazaro Arms, LCSW Phone Number: 03/14/2020, 4:09 PM  Clinical Narrative:  CSW talked with patient's PACE SW Marylene Land and update provided regarding psychiatric facility search.  CSW was asked to send patient's clinicals to [facility] as they have a contract with this facility.   11:43 am: CSW talked with Cherre Huger with [facility] and was informed that patient is not appropriate for their facility as their patients must be independent with ADL's and Ms. Tenorio needs more assistance than they can provide.  3:54 pm: Clinicals faxed to [facility] - fax number [phone].      Expected Discharge Plan and Services - Psychiatric hospital                                               Social Determinants of Health (SDOH) Interventions  Patient in need of inpatient psychiatric placement  Readmission Risk Interventions No flowsheet data found.

## 2020-03-14 NOTE — Progress Notes (Addendum)
   Subjective: HD#15 No acute overnight events.  Patient evaluated at bedside this AM.  Jessica Campbell is laying sideways in bed, appears more agitated. We attempted to reposition her but she immediately slid herself sideways in bed. She is answering some yes / no questions.   Objective:  Vital signs in last 24 hours: Vitals:   03/13/20 1743 03/13/20 2106 03/14/20 0501 03/14/20 0859  BP: (!) 119/40 (!) 122/49 (!) 149/94 125/80  Pulse: (!) 48 (!) 57 (!) 51 74  Resp: 17 19 18 19   Temp: 98.5 F (36.9 C) 97.6 F (36.4 C) 97.9 F (36.6 C) 98 F (36.7 C)  TempSrc: Oral  Oral   SpO2: 96% 98% 97% 99%  Weight:       BMP Latest Ref Rng & Units 03/14/2020 03/11/2020 03/08/2020  Glucose 70 - 99 mg/dL 14/05/2019) 737(T) 062(I)  BUN 8 - 23 mg/dL 23 21 17   Creatinine 0.44 - 1.00 mg/dL 948(N) ) 4.62(V)  Sodium 135 - 145 mmol/L 141 138 144  Potassium 3.5 - 5.1 mmol/L 4.1 4.3 4.4  Chloride 98 - 111 mmol/L 105 103 109  CO2 22 - 32 mmol/L 26 25 23   Calcium 8.9 - 10.3 mg/dL 0.35(K 9.9 0.93(G   General: Well developed, well nourished female lying awkwardly in chair, NAD Neuro: Unable to access.  Psych: Admits to auditory and visual hallucinations. Responding to internal stimuli, word finding difficulty  Assessment/Plan: Jessica Campbell is a 64 y.o. with PMH of schizoaffective disorder with paranoia, HTN, HLD, DM, Depression admitted for acute kidney injury, improved now and Schizoaffective disorder  Principal Problem:   Paranoid schizophrenia, chronic condition with acute exacerbation (HCC) Active Problems:   AKI (acute kidney injury) (HCC)   DM (diabetes mellitus), type 2 (HCC)   HTN (hypertension), benign   HLD (hyperlipidemia)   Acute kidney injury (HCC)   Hallucinations  Schizoaffective disorder Patient seen responding to internal stimuli. Actively hallucinating, little agitated. Admits to auditory hallucinations, stating they are telling her to hurt herself but could not clarify further.  Admits to visual hallucinations. Seems paranoid and hypervigilant. Denies suicidal and homicidal ideations. Had thought blocking and difficulty findings words today. Psychiatry recommended 1:1 sitter and inpatient psychiatry.   - Awaiting geriatric psychiatry placement.  - Greatly appreciate psych assistance - Continue agitation protocol - Continue  Abilify to10mg  daily - Continue Zoloft 50mg  daily - Continue Trazodone 300mg  qHS  - Zypreza 5 mg QHS -1:1 sitter   Drug-induced extrapyramidal movement disorder Patient's TD is improving .Currently on Cogentin and amantadine.    - C/w home meds: amantadine 100mg  BID, bentropine 1mg  am, 0.5mg  pm   Acute Kidney Injury on Chronic Kidney Disease stage  ~sCr is 1.40 and stable at this time , wonder if this is new baseline.    - Avoid nephrotoxic agents  - Encourage PO intake  - Regular diet - Ensure supplement  Prior to Admission Living Arrangement: Home Anticipated Discharge Location: Inpatient psychiatry Barriers to Discharge: Disposition Dispo: Pending Disposition  Jessica Campbell, 99.3, MD 03/14/2020, 10:41 AM Pager: [phone] After 5pm on weekdays and 1pm on weekends: On Call pager [phone]

## 2020-03-15 DIAGNOSIS — N1831 Chronic kidney disease, stage 3a: Secondary | ICD-10-CM | POA: Diagnosis not present

## 2020-03-15 DIAGNOSIS — N179 Acute kidney failure, unspecified: Secondary | ICD-10-CM | POA: Diagnosis not present

## 2020-03-15 DIAGNOSIS — I129 Hypertensive chronic kidney disease with stage 1 through stage 4 chronic kidney disease, or unspecified chronic kidney disease: Secondary | ICD-10-CM | POA: Diagnosis not present

## 2020-03-15 LAB — GLUCOSE, CAPILLARY
Glucose-Capillary: 101 mg/dL — ABNORMAL HIGH (ref 70–99)
Glucose-Capillary: 117 mg/dL — ABNORMAL HIGH (ref 70–99)
Glucose-Capillary: 220 mg/dL — ABNORMAL HIGH (ref 70–99)
Glucose-Capillary: 126 mg/dL — ABNORMAL HIGH (ref 70–99)

## 2020-03-15 MED ORDER — MELATONIN 3 MG PO TABS
3.0000 mg | ORAL_TABLET | Freq: Every day | ORAL | Status: DC
  Administered 2020-03-15 – 2020-04-15 (×31): 3 mg via ORAL
  Filled 2020-03-15 (×32): qty 1

## 2020-03-15 MED ORDER — LORAZEPAM 1 MG PO TABS
1.0000 mg | ORAL_TABLET | ORAL | Status: DC | PRN
  Filled 2020-03-15: qty 1

## 2020-03-15 MED ORDER — SERTRALINE HCL 100 MG PO TABS
100.0000 mg | ORAL_TABLET | Freq: Every day | ORAL | Status: DC
  Administered 2020-03-16 – 2020-04-07 (×22): 100 mg via ORAL
  Filled 2020-03-15 (×23): qty 1

## 2020-03-15 MED ORDER — ZIPRASIDONE MESYLATE 20 MG IM SOLR
20.0000 mg | INTRAMUSCULAR | Status: DC | PRN
  Filled 2020-03-15: qty 20

## 2020-03-15 MED ORDER — OLANZAPINE 5 MG PO TABS
7.5000 mg | ORAL_TABLET | Freq: Every day | ORAL | Status: DC
  Administered 2020-03-15 – 2020-03-23 (×9): 7.5 mg via ORAL
  Filled 2020-03-15 (×9): qty 2

## 2020-03-15 MED ORDER — ARIPIPRAZOLE 5 MG PO TABS
5.0000 mg | ORAL_TABLET | Freq: Every day | ORAL | Status: DC
  Administered 2020-03-16 – 2020-03-20 (×5): 5 mg via ORAL
  Filled 2020-03-15 (×5): qty 1

## 2020-03-15 MED ORDER — LORAZEPAM 2 MG/ML IJ SOLN: 0.5000 mg | Freq: Every day | INTRAMUSCULAR | Status: DC

## 2020-03-15 MED ORDER — LORAZEPAM 0.5 MG PO TABS
0.5000 mg | ORAL_TABLET | Freq: Every day | ORAL | Status: DC
  Administered 2020-03-15: 0.5 mg via ORAL
  Filled 2020-03-15: qty 1

## 2020-03-15 MED ORDER — OLANZAPINE 5 MG PO TBDP
10.0000 mg | ORAL_TABLET | Freq: Three times a day (TID) | ORAL | Status: DC | PRN
  Filled 2020-03-15: qty 2

## 2020-03-15 NOTE — Progress Notes (Signed)
Patient is up in the chair she walked in her room with therapy. She is more alert and aware today and able to answer questions. Sister is at the bedside conversating with her. Oriented to place and self. Took her medications and ate half of her lunch, drunk her whole ensure. She has slight tremors but is able to feed herself, hold her liquids, and rest while sitting. No hallucinations observed. No yelling out. No uncontrollable jerking or tremors.   Janathan Bribiesca, RN

## 2020-03-15 NOTE — Progress Notes (Signed)
   Subjective: HD#16 No acute overnight events.  Patient's sisters state that last night, she was having tremors and shouting out, even after she received medication until 2:00pm. However, she has been sleeping comfortably since then and continues to sleep during admission. Her sister continues to express concern, asking if she could have a CT scan of her brain for further assessment. Her sisters say she was very close with her mother, who has passed away. They think her current state may be related to persistent grieve due to this. Previous CT scans were reviewed with the family, which did not show history of stroke. Family denies known history of dementia.   Objective:  Vital signs in last 24 hours: Vitals:   03/14/20 1639 03/14/20 2219 03/15/20 0509 03/15/20 0919  BP: (!) 141/128 (!) 122/55 125/60 124/62  Pulse: 64 (!) 55 60 (!) 57  Resp: 19 18 18 18   Temp: 98.1 F (36.7 C) 98.1 F (36.7 C) 98 F (36.7 C) 98.2 F (36.8 C)  TempSrc: Oral Oral Axillary Axillary  SpO2: 96% 92% 92% 93%  Weight:       Physical exam: General: Well developed, well noursihed, sleeping comfortably in bed CV: RRR, S1, S2 normal, no r/m/g Neuro: Unable to access, patient sleeping  Assessment/Plan: Kassadee Robinsonis a 64 y.o.with PMH of schizoaffective disorder with paranoia, HTN, HLD, DM, Depressionadmitted for acute kidney injury, improved nowandSchizoaffective disorder.  Principal Problem:   Paranoid schizophrenia, chronic condition with acute exacerbation (HCC) Active Problems:   AKI (acute kidney injury) (HCC)   DM (diabetes mellitus), type 2 (HCC)   HTN (hypertension), benign   HLD (hyperlipidemia)   Acute kidney injury (HCC)   Hallucinations   Delirium   Schizoaffective disorder Unable to evaluate patient today. As per sisters was actively hallucinating before going to bed.  Psych has been consulted again to help with medication adjustment, sleep aid. SW faxed out to [facility],  waiting to hear from them.  - Awaiting geriatric psychiatry placement.  - Greatly appreciate psych assistance - Continue agitation protocol -ContinueAbilify to10mg  daily - Continue Zoloft 50mg  daily - Continue Trazodone 300mg  qHS -Zypreza5 mgQHS - 1:1 sitter - Melatonin 3 mg   Drug-induced extrapyramidal movement disorder Patient's TD is improving .Currently on Cogentin and amantadine.   - C/w home meds: amantadine 100mg  BID, bentropine 1mg  am, 0.5mg  pm  Acute Kidney Injuryon Chronic Kidney Disease stage ~sCr is 1.40 and stable at this time , wonder if this is new baseline. - Avoid nephrotoxic agents  - Encourage PO intake - Regular diet - Ensure supplement  Prior to Admission Living Arrangement:Home Anticipated Discharge Location:Inpatient psychiatry Barriers to Discharge:Disposition Dispo:Pending Disposition Vasilisa Vore, 65, MD 03/15/2020, 1:35 PM Pager: [phone] After 5pm on weekdays and 1pm on weekends: On Call pager [phone]

## 2020-03-15 NOTE — Consult Note (Signed)
[facility] Face-to-Face Psychiatry Consult   Reason for Consult:"Agitation, word finding difficulty, and command hallucinations. ' Referring Physician:  Reymundo Poll, MD Patient Identification: Jessica Campbell MRN:  [id number] Principal Diagnosis: Paranoid schizophrenia, chronic condition with acute exacerbation (HCC) Diagnosis:  Principal Problem:   Paranoid schizophrenia, chronic condition with acute exacerbation (HCC) Active Problems:   AKI (acute kidney injury) (HCC)   DM (diabetes mellitus), type 2 (HCC)   HTN (hypertension), benign   HLD (hyperlipidemia)   Acute kidney injury (HCC)   Hallucinations   Delirium   Total Time spent with patient: 30 minutes  Subjective: 'Patient is observed to be in a sound sleep, snoring and did not attempt to wake patient. Per nursing staff and sister who is at bedside patient has not been asleep for 3 days.    Very long discussion had with her sister(Jessica Campbell) who is at the bedside regarding polypharmacy, schizophrenia disease process, EPS and Tardive Dyskinesia disease process and necessary waiting period for placement and medication effectiveness. Depsite lengthy conversation of 25+ and multiple attempts to explain all of the above there are to be a healthy literacy barrier. Sister states that her patient was independent and living on her own up until their mother passed away in 04-26-2019. She reports two months after her sister began to decline in health and several months later she was admitted to [facility] for similar presentation. At that time she was exhibiting disorganized thoughts, responding to internal stimuli, hallucinating, and negative symptoms of schizophrenia. She was stabilized on zoloft, Prolixin, ingrezza and cogentin, before she was discharged to a nursing home. At that time her psychiatric services were resumed by [facility], and she appeared to be improving as she stabilized on Prolixin 25mg  IM. Discussed with sister the importance of  allowing the medications to work as well as presenting symptoms. Expressed my concerns that patient is now on multiple psychotropic medications to honor family requests, nursing requests, and request of medical team.   Objective:  64 y.o. female with history of paranoid schizophrenia, who presented with decompensation of her schizophrenia. SHe has an extended length of stay that has been complicated by her schizophrenia. She was originally on ABilify 15mg , sertraline, trazodone, amantadine and cogentin. It was felt that patient was not responding to the above treatment, psychiatry was again consulted for management of the above symptoms to include insomnia, restlessness, agitation, and worsening psychosis. She was started on olanzapine with attempts to cross titrate, even though the original goal was to get her to a [facility] as there was some concern regarding medication compliance. Patient continues to have some periods of improvement and other periods in which she continues to decompensate. THere are reports of agitation restlessness and confusion, which can be presenting as a form of EPS.  During my observation patient is observed to be in a deep sleep, and is heard snoring throughout the conversation with her sister.   Past Psychiatric History: Paranoid Schizophrenia. Has tried multiple antipsychotics to include both oral and injectable. Recently she has had multiple psychiatric admissions for her worsening hallucinations and paranoia. She denies any suicide attempts.   Risk to Self:   Denies Risk to Others:   Denies Prior Inpatient Therapy:   Yes, recently 09/20/2019 at [facility] Prior Outpatient Therapy:   [facility]   Past Medical History:  Past Medical History:  Diagnosis Date  . Borderline diabetes   . Depression   . Diabetes mellitus without complication (HCC)   . High cholesterol   . Hypertension   .  Schizoaffective disorder (HCC)   . Scoliosis     Past Surgical History:  Procedure Laterality Date   . ABDOMINAL HYSTERECTOMY     Family History: No family history on file. Family Psychiatric  History: Unable to assess Social History:  Social History   Substance and Sexual Activity  Alcohol Use No     Social History   Substance and Sexual Activity  Drug Use No    Social History   Socioeconomic History  . Marital status: Single    Spouse name: Not on file  . Number of children: Not on file  . Years of education: Not on file  . Highest education level: Not on file  Occupational History  . Not on file  Tobacco Use  . Smoking status: Never Smoker  . Smokeless tobacco: Never Used  Substance and Sexual Activity  . Alcohol use: No  . Drug use: No  . Sexual activity: Not on file  Other Topics Concern  . Not on file  Social History Narrative  . Not on file   Social Determinants of Health   Financial Resource Strain: Not on file  Food Insecurity: Not on file  Transportation Needs: Not on file  Physical Activity: Not on file  Stress: Not on file  Social Connections: Not on file   Additional Social History:    Allergies:   Allergies  Allergen Reactions  . Paliperidone     Other reaction(s): Hives / Skin Rash  . Quetiapine Fumarate Rash    Other reaction(s): Hives / Skin Rash  . Risperidone Rash    Other reaction(s): Hives / Skin Rash  . Diphenhydramine Hcl     Other reaction(s): Difficulty breathing, Hives / Skin Rash  . Latex Rash    Other reaction(s): Rash, Rash Other reaction(s): DERMATITIS Other reaction(s): DERMATITIS     Labs:  Results for orders placed or performed during the hospital encounter of 02/27/20 (from the past 48 hour(s))  Glucose, capillary     Status: Abnormal   Collection Time: 03/13/20  4:47 PM  Result Value Ref Range   Glucose-Capillary 100 (H) 70 - 99 mg/dL    Comment: Glucose reference range applies only to samples taken after fasting for at least 8 hours.  Glucose, capillary     Status: Abnormal   Collection Time: 03/13/20   9:04 PM  Result Value Ref Range   Glucose-Capillary 167 (H) 70 - 99 mg/dL    Comment: Glucose reference range applies only to samples taken after fasting for at least 8 hours.  Basic metabolic panel     Status: Abnormal   Collection Time: 03/14/20  4:34 AM  Result Value Ref Range   Sodium 141 135 - 145 mmol/L   Potassium 4.1 3.5 - 5.1 mmol/L   Chloride 105 98 - 111 mmol/L   CO2 26 22 - 32 mmol/L   Glucose, Bld 117 (H) 70 - 99 mg/dL    Comment: Glucose reference range applies only to samples taken after fasting for at least 8 hours.   BUN 23 8 - 23 mg/dL   Creatinine, Ser 1.61 (H) 0.44 - 1.00 mg/dL   Calcium 09.6 8.9 - 04.5 mg/dL   GFR, Estimated 42 (L) >60 mL/min    Comment: (NOTE) Calculated using the CKD-EPI Creatinine Equation (2021)    Anion gap 10 5 - 15    Comment: Performed at [facility], [address] [place], [place] [address]  Glucose, capillary     Status:  Abnormal   Collection Time: 03/14/20  6:42 AM  Result Value Ref Range   Glucose-Capillary 119 (H) 70 - 99 mg/dL    Comment: Glucose reference range applies only to samples taken after fasting for at least 8 hours.  Glucose, capillary     Status: Abnormal   Collection Time: 03/14/20 11:24 AM  Result Value Ref Range   Glucose-Capillary 139 (H) 70 - 99 mg/dL    Comment: Glucose reference range applies only to samples taken after fasting for at least 8 hours.  Glucose, capillary     Status: Abnormal   Collection Time: 03/14/20  4:38 PM  Result Value Ref Range   Glucose-Capillary 121 (H) 70 - 99 mg/dL    Comment: Glucose reference range applies only to samples taken after fasting for at least 8 hours.  Glucose, capillary     Status: Abnormal   Collection Time: 03/14/20  9:20 PM  Result Value Ref Range   Glucose-Capillary 147 (H) 70 - 99 mg/dL    Comment: Glucose reference range applies only to samples taken after fasting for at least 8 hours.  Glucose, capillary     Status: Abnormal   Collection Time:  03/15/20  6:41 AM  Result Value Ref Range   Glucose-Capillary 101 (H) 70 - 99 mg/dL    Comment: Glucose reference range applies only to samples taken after fasting for at least 8 hours.  Glucose, capillary     Status: Abnormal   Collection Time: 03/15/20 11:39 AM  Result Value Ref Range   Glucose-Capillary 117 (H) 70 - 99 mg/dL    Comment: Glucose reference range applies only to samples taken after fasting for at least 8 hours.    Current Facility-Administered Medications  Medication Dose Route Frequency Provider Last Rate Last Admin  . acetaminophen (TYLENOL) tablet 650 mg  650 mg Oral Q6H PRN Jacques Navy, MD   650 mg at 03/03/20 2156   Or  . acetaminophen (TYLENOL) suppository 650 mg  650 mg Rectal Q6H PRN Norins, Rosalyn Gess, MD      . amantadine (SYMMETREL) capsule 100 mg  100 mg Oral BID Theotis Barrio, MD   100 mg at 03/14/20 2138  . [START ON 03/16/2020] ARIPiprazole (ABILIFY) tablet 5 mg  5 mg Oral Daily Starkes-Perry, Juel Burrow, FNP      . aspirin EC tablet 81 mg  81 mg Oral Daily Theotis Barrio, MD   81 mg at 03/14/20 1020  . atorvastatin (LIPITOR) tablet 40 mg  40 mg Oral Daily Norins, Rosalyn Gess, MD   40 mg at 03/14/20 1019  . benztropine (COGENTIN) tablet 1 mg  1 mg Oral Daily Miguel Aschoff, MD   1 mg at 03/14/20 1020   And  . benztropine (COGENTIN) tablet 0.5 mg  0.5 mg Oral QHS Miguel Aschoff, MD   0.5 mg at 03/14/20 2138  . feeding supplement (ENSURE ENLIVE / ENSURE PLUS) liquid 237 mL  237 mL Oral BID BM Dagar, Anjali, MD   237 mL at 03/14/20 1447  . heparin injection 5,000 Units  5,000 Units Subcutaneous Q8H Theotis Barrio, MD   5,000 Units at 03/15/20 0640  . insulin aspart (novoLOG) injection 0-15 Units  0-15 Units Subcutaneous TID WC Norins, Rosalyn Gess, MD   2 Units at 03/14/20 1723  . LORazepam (ATIVAN) injection 1 mg  1 mg Intramuscular Q6H PRN Verdene Lennert, MD   1 mg at 03/14/20 1905  . OLANZapine zydis (ZYPREXA) disintegrating  tablet 10 mg  10 mg Oral  Q8H PRN Dagar, Geralynn Rile, MD       And  . LORazepam (ATIVAN) tablet 1 mg  1 mg Oral PRN Dagar, Geralynn Rile, MD       And  . ziprasidone (GEODON) injection 20 mg  20 mg Intramuscular PRN Dagar, Geralynn Rile, MD      . melatonin tablet 3 mg  3 mg Oral QHS Dagar, Anjali, MD      . OLANZapine (ZYPREXA) tablet 7.5 mg  7.5 mg Oral QHS Starkes-Perry, Juel Burrow, FNP      . pantoprazole (PROTONIX) EC tablet 40 mg  40 mg Oral Daily Theotis Barrio, MD   40 mg at 03/14/20 1019  . polyethylene glycol (MIRALAX / GLYCOLAX) packet 17 g  17 g Oral BID Verdene Lennert, MD   17 g at 03/14/20 2138  . senna (SENOKOT) tablet 17.2 mg  2 tablet Oral BID Verdene Lennert, MD   17.2 mg at 03/14/20 2138  . [START ON 03/16/2020] sertraline (ZOLOFT) tablet 100 mg  100 mg Oral Daily Starkes-Perry, Aldin Drees S, FNP      . traZODone (DESYREL) tablet 300 mg  300 mg Oral QHS Reymundo Poll, MD   300 mg at 03/14/20 2138    Musculoskeletal: Strength & Muscle Tone: abnormal and increased Gait & Station: unsteady Patient leans: N/A  Psychiatric Specialty Exam: Physical Exam Psychiatric:        Attention and Perception: Attention normal. She perceives visual hallucinations.        Mood and Affect: Mood normal. Affect is flat.        Speech: Speech is delayed.        Behavior: Behavior normal. Behavior is cooperative.        Thought Content: Thought content normal.        Cognition and Memory: Cognition normal.        Judgment: Judgment normal.     Review of Systems  Psychiatric/Behavioral: Positive for hallucinations.    Blood pressure 124/62, pulse (!) 57, temperature 98.2 F (36.8 C), temperature source Axillary, resp. rate 18, weight 66.3 kg, SpO2 93 %.Body mass index is 26.73 kg/m.                                                         Treatment Plan Summary: -Reduce Abilify 5 mg daily for psychosis. -Increase zoloft to target any possible underlying depression 2/t mothers loss which resulted in her  decompensation.  -Will increase olanzapine 7.5 mg, 1 tablet at bedtime for psychosis. If she does not respond within the next week will likely perform a wash and resume prolixin as she was previously on. Concern with this was her worsening EPS and TD symptoms, which have improved since d/c certain medications.  -Continue 1:1 sitter as deemed necessary by the treatment team. - Continue working closely with SW to facilitate placement at inpatient gero-psych facility.  - TD symptoms have improved:continue Amantadine and Benztropine.     -Patient appears to have some mild cognitive impairment, also does not appear to have the capacity to make medical decision due to her current limited mentation.  -There are concerns for polypharmacy as we continue to address multiple symptoms of paranoid schizophrenia.Please note that some of these symptoms will continue and achievement of 100% cessation of all  symptoms is very difficult to obtain in an inpatient setting for a disease of this chronicity.  -Recommend limiting use of benzodiazepines and incorporate PT to increase mobility and reduce environmental stimulation.    Disposition: Recommend psychiatric Inpatient admission when medically cleared.  Maryagnes Amosakia S Starkes-Perry, FNP 03/05/2020 3:24PM

## 2020-03-15 NOTE — Progress Notes (Signed)
Physical Therapy Treatment Patient Details Name: Jessica Campbell MRN: [id number] DOB: 24-Jan-1956 Today's Date: 03/15/2020    History of Present Illness Jessica Campbell is a 64 y.o. with PMH of schizoaffective disorder with paranoia, HTN, HLD, DM, depression admitted for hallucinations and durg induced extrapyramidal movement disorder.    PT Comments    Patient received in bed, much more awake and alert today but still with continuous jerking and shaking. Restless and impulsive but followed cues much better today. Still needs heavy physical assist for transfers and gait due to strong posterior lean, which she remains unaware of. Continues to fatigue easily- only able to tolerate gait distance 82ft with MaxA to maintain balance due to posterior bias/balance loss. Left up in recliner with chair alarm active and sister present providing direct supervision. Per RN, able to get some good rest earlier today after not sleeping for 3 days, and hopefully this will help [place] to progress her gait distance and activity tolerance.     Follow Up Recommendations  Other (comment) (geri psych)     Equipment Recommendations  Rolling walker with 5" wheels    Recommendations for Other Services       Precautions / Restrictions Precautions Precautions: Fall Precaution Comments: visual and auditory hallucinations Restrictions Weight Bearing Restrictions: No    Mobility  Bed Mobility Overal bed mobility: Needs Assistance Bed Mobility: Supine to Sit     Supine to sit: Min assist     General bed mobility comments: Able to physically get herself to EOB but had posterior lean requiring MinA to maintain balance despite verbal/tactile cues especially when scooting towards EOB to get feet on floor  Transfers Overall transfer level: Needs assistance Equipment used: 1 person hand held assist Transfers: Sit to/from Stand Sit to Stand: Mod assist         General transfer comment: MinA to boost to upright  but immediately increased to ModA once in standing due to heavy posterior lean  Ambulation/Gait Ambulation/Gait assistance: Max assist Gait Distance (Feet): 25 Feet Assistive device: 1 person hand held assist Gait Pattern/deviations: Step-through pattern;Decreased stride length;Drifts right/left;Scissoring;Narrow base of support Gait velocity: decreased   General Gait Details: still with constant jerks/shakes and very unsteady with same heavy posterior lean- fatigued very quickly today again. Needed MaxA to counter heavy posterior lean and verbal cues to move L foot to improve reciprocal gait pattern   Stairs             Wheelchair Mobility    Modified Rankin (Stroke Patients Only)       Balance Overall balance assessment: Needs assistance Sitting-balance support: Feet supported;Single extremity supported;Bilateral upper extremity supported Sitting balance-Leahy Scale: Poor Sitting balance - Comments: minguard for static sitting Postural control: Posterior lean;Right lateral lean Standing balance support: Single extremity supported;During functional activity Standing balance-Leahy Scale: Poor Standing balance comment: up to MaxA to maintain balance dynamically due to heavy posterior lean                            Cognition Arousal/Alertness: Awake/alert Behavior During Therapy: Restless;Impulsive (constant shaking) Overall Cognitive Status: Impaired/Different from baseline Area of Impairment: Orientation;Attention;Memory;Following commands;Safety/judgement;Awareness;Problem solving                 Orientation Level: Disoriented to;Place;Time;Situation Current Attention Level: Sustained Memory: Decreased short-term memory Following Commands: Follows one step commands consistently;Follows one step commands with increased time Safety/Judgement: Decreased awareness of safety;Decreased awareness of deficits Awareness: Intellectual Problem Solving:  Slow  processing;Requires verbal cues;Difficulty sequencing;Decreased initiation;Requires tactile cues General Comments: still restless and impulsive but was able to get some sleep and more alert today. Still easily distractable and unaware of posterior lean.      Exercises      General Comments General comments (skin integrity, edema, etc.): sister in room providing direct supervision      Pertinent Vitals/Pain Pain Assessment: Faces Faces Pain Scale: No hurt Pain Intervention(s): Limited activity within patient's tolerance;Monitored during session    Home Living                      Prior Function            PT Goals (current goals can now be found in the care plan section) Acute Rehab PT Goals Patient Stated Goal: No goals stated PT Goal Formulation: With patient Time For Goal Achievement: 03/29/20 Potential to Achieve Goals: Fair Progress towards PT goals: Progressing toward goals (slowly)    Frequency    Min 2X/week      PT Plan Current plan remains appropriate    Co-evaluation              AM-PAC PT "6 Clicks" Mobility   Outcome Measure  Help needed turning from your Campbell to your side while in a flat bed without using bedrails?: None Help needed moving from lying on your Campbell to sitting on the side of a flat bed without using bedrails?: A Little Help needed moving to and from a bed to a chair (including a wheelchair)?: A Lot Help needed standing up from a chair using your arms (e.g., wheelchair or bedside chair)?: A Lot Help needed to walk in hospital room?: A Lot Help needed climbing 3-5 steps with a railing? : Total 6 Click Score: 14    End of Session Equipment Utilized During Treatment: Gait belt Activity Tolerance: Patient limited by fatigue Patient left: in chair;with call bell/phone within reach;with family/visitor present;with chair alarm set Nurse Communication: Mobility status PT Visit Diagnosis: Unsteadiness on feet (R26.81);History  of falling (Z91.81);Other abnormalities of gait and mobility (R26.89)     Time: 0263-7858 PT Time Calculation (min) (ACUTE ONLY): 17 min  Charges:  $Gait Training: 8-22 mins                     Madelaine Etienne, DPT, PN1   Supplemental Physical Therapist [facility]    Pager [phone] Acute Rehab Office [phone]

## 2020-03-16 DIAGNOSIS — I129 Hypertensive chronic kidney disease with stage 1 through stage 4 chronic kidney disease, or unspecified chronic kidney disease: Secondary | ICD-10-CM | POA: Diagnosis not present

## 2020-03-16 DIAGNOSIS — N1831 Chronic kidney disease, stage 3a: Secondary | ICD-10-CM | POA: Diagnosis not present

## 2020-03-16 DIAGNOSIS — F200 Paranoid schizophrenia: Secondary | ICD-10-CM | POA: Diagnosis not present

## 2020-03-16 DIAGNOSIS — N179 Acute kidney failure, unspecified: Secondary | ICD-10-CM | POA: Diagnosis not present

## 2020-03-16 LAB — GLUCOSE, CAPILLARY
Glucose-Capillary: 134 mg/dL — ABNORMAL HIGH (ref 70–99)
Glucose-Capillary: 143 mg/dL — ABNORMAL HIGH (ref 70–99)
Glucose-Capillary: 104 mg/dL — ABNORMAL HIGH (ref 70–99)
Glucose-Capillary: 116 mg/dL — ABNORMAL HIGH (ref 70–99)

## 2020-03-16 MED ORDER — LORAZEPAM 2 MG/ML IJ SOLN: 0.2500 mg | Freq: Every day | INTRAMUSCULAR | Status: DC

## 2020-03-16 MED ORDER — LORAZEPAM 0.5 MG PO TABS
0.2500 mg | ORAL_TABLET | Freq: Every day | ORAL | Status: DC
  Administered 2020-03-16: 0.25 mg via ORAL
  Filled 2020-03-16: qty 1

## 2020-03-16 NOTE — TOC Progression Note (Signed)
Transition of Care [facility]) - Progression Note    Patient Details  Name: Jessica Campbell MRN: [id number] Date of Birth: 05/17/1955  [facility] CM/SW Contact  Okey Dupre Lazaro Arms, LCSW Phone Number: 03/16/2020, 6:32 PM  Clinical Narrative: Received call form an MD regarding patient and geriatric psych facility search and update provided. Search will continue and some non-geriatric facilities will be contacted.            Expected Discharge Plan and Services - Psychiatric facility                                                Social Determinants of Health (SDOH) Interventions  Patient remains in need of psychiatric facility placement.  Readmission Risk Interventions No flowsheet data found.

## 2020-03-16 NOTE — Progress Notes (Signed)
   Subjective: HD#17 No acute overnight events. Patient evaluated at bedside this AM. She was sleeping but woke up and sat in bed to eat her breakfast. States her full name and knows she is in hospital. Her blinds were opened and she is informed about time.  Of note: Talked to nurse about the time patient went to sleep and she was not aware but from now on, will keep [place] posted her sleeping schedule.   Objective:  Vital signs in last 24 hours: Vitals:   03/15/20 0919 03/15/20 1639 03/15/20 2047 03/16/20 0432  BP: 124/62 120/74 (!) 133/55 (!) 132/59  Pulse: (!) 57 74 (!) 55 (!) 50  Resp: 18 18 16 16   Temp: 98.2 F (36.8 C) 98.3 F (36.8 C) 98.4 F (36.9 C) 98.6 F (37 C)  TempSrc: Axillary Axillary Axillary Axillary  SpO2: 93% 96% 96% 97%  Weight:   67.8 kg    Physical exam: General: Well developed, well noursihed, sitting comfortably in bed Neuro: Alert, awake, oriented to place & person Psych: Normal mood and affect  Assessment/Plan: Jessica Robinsonis a 64 y.o.with PMH of schizoaffective disorder with paranoia, HTN, HLD, DM, Depressionadmitted for acute kidney injury, improved nowandSchizoaffective disorder.  Principal Problem:   Paranoid schizophrenia, chronic condition with acute exacerbation (HCC) Active Problems:   AKI (acute kidney injury) (HCC)   DM (diabetes mellitus), type 2 (HCC)   HTN (hypertension), benign   HLD (hyperlipidemia)   Acute kidney injury (HCC)   Hallucinations   Delirium   Schizoaffective disorder Psych was consulted and they increased her Zoloft to 100 mg at bedtime and increased her Zyprexa to 7.5 mg to help with psychosis. Appreciate the psych consult. Decreasing Ativan dosing today, would concentrate more on her sleeping schedule, PT/OT and frequent reorientation. Sw is working hard to find the patient placement.  - Awaiting geriatric psychiatry placement.  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   - C/w Amantadine 100mg  BID - C/w Bentropine 1mg  AM & 0.5mg  PM  Acute Kidney Injuryon Chronic Kidney Disease stage ~sCr is 1.40 and stable at this time , wonder if this is new baseline.  - Avoid nephrotoxic agents  - Encourage PO intake - Regular diet - Ensure supplement  Prior to Admission Living Arrangement:Home Anticipated Discharge Location:Inpatient psychiatry Barriers to Discharge:Disposition Dispo:Pending Disposition  Jessica Campbell, 65, MD 03/16/2020, 8:38 AM Pager: [phone] After 5pm on weekdays and 1pm on weekends: On Call pager [phone]

## 2020-03-17 DIAGNOSIS — F200 Paranoid schizophrenia: Secondary | ICD-10-CM | POA: Diagnosis not present

## 2020-03-17 DIAGNOSIS — N179 Acute kidney failure, unspecified: Secondary | ICD-10-CM | POA: Diagnosis not present

## 2020-03-17 LAB — GLUCOSE, CAPILLARY
Glucose-Capillary: 100 mg/dL — ABNORMAL HIGH (ref 70–99)
Glucose-Capillary: 115 mg/dL — ABNORMAL HIGH (ref 70–99)
Glucose-Capillary: 110 mg/dL — ABNORMAL HIGH (ref 70–99)
Glucose-Capillary: 127 mg/dL — ABNORMAL HIGH (ref 70–99)

## 2020-03-17 NOTE — Progress Notes (Signed)
   Subjective: HD#18 No acute overnight events. Jessica Campbell evaluated at bedside this AM. She states she feels better this morning, slept alright last night. She states her full name and notes she needs to take shower and move around. She states her mother visited her yesterday evening and voices are not telling her to hurt herself or anyone around her.  Objective:  Vital signs in last 24 hours: Vitals:   03/16/20 1839 03/16/20 2107 03/17/20 0547 03/17/20 0945  BP: 115/81 (!) 110/55 (!) 143/60 96/60  Pulse: (!) 49 (!) 48 (!) 49 (!) 52  Resp: 18 18 19 18   Temp: 98.3 F (36.8 C) 98.2 F (36.8 C) 98.6 F (37 C) (!) 97.5 F (36.4 C)  TempSrc:  Oral  Oral  SpO2: 97% 96% 96% 96%  Weight:       Physical exam: General: Well developed, well noursihed, lying comfortably in bed Neuro: Alert, awake, oriented to place & person Psych: Pleasent mood and affect  Assessment/Plan: Jessica Robinsonis a 64 y.o.with PMH of schizoaffective disorder with paranoia, HTN, HLD, DM, Depressionadmitted for acute kidney injury, improved nowandSchizoaffective disorder.  Principal Problem:   Paranoid schizophrenia, chronic condition with acute exacerbation (HCC) Active Problems:   AKI (acute kidney injury) (HCC)   DM (diabetes mellitus), type 2 (HCC)   HTN (hypertension), benign   HLD (hyperlipidemia)   Acute kidney injury (HCC)   Hallucinations   Delirium   Schizoaffective disorder Admits to auditory and visual hallucinations this morning. Denies suicidal or homicidal ideations. Denies command hallucinations. Slept at 11 last night and got up around 4:30 am. Alert, oriented, awake this morning, so discontinuing Ativan this evening. SW is working hard to find the patient placement.  - Awaiting geriatric psychiatry placement.  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   - C/w Amantadine 100mg  BID - C/w Bentropine 1mg  AM & 0.5mg  PM  Acute Kidney Injuryon Chronic Kidney Disease stage ~sCr is 1.40 and stable at this time , wonder if this is new baseline.  - Avoid nephrotoxic agents  - Encourage PO intake - Regular diet - Ensure supplement  Prior to Admission Living Arrangement:Home Anticipated Discharge Location:Inpatient psychiatry Barriers to Discharge:Disposition Dispo:Pending Disposition  Jessica Campbell, 65, MD 03/17/2020, 10:29 AM Pager: [phone] After 5pm on weekdays and 1pm on weekends: On Call pager [phone]

## 2020-03-18 LAB — GLUCOSE, CAPILLARY
Glucose-Capillary: 117 mg/dL — ABNORMAL HIGH (ref 70–99)
Glucose-Capillary: 187 mg/dL — ABNORMAL HIGH (ref 70–99)
Glucose-Capillary: 126 mg/dL — ABNORMAL HIGH (ref 70–99)
Glucose-Capillary: 110 mg/dL — ABNORMAL HIGH (ref 70–99)

## 2020-03-18 MED ORDER — LORAZEPAM 2 MG/ML IJ SOLN
0.2500 mg | Freq: Every day | INTRAMUSCULAR | Status: DC
  Filled 2020-03-18 (×2): qty 1

## 2020-03-18 MED ORDER — LORAZEPAM 0.5 MG PO TABS
0.2500 mg | ORAL_TABLET | Freq: Every day | ORAL | Status: DC
  Administered 2020-03-18 – 2020-03-23 (×5): 0.25 mg via ORAL
  Filled 2020-03-18 (×5): qty 1

## 2020-03-18 NOTE — Progress Notes (Signed)
   Subjective:   Jessica Campbell states that she is doing well today. When asked if anyone is in the room with her today, she states that now the family is sleeping in the den. When asked if the voices are telling her to hurt her self or others today, she states that the voices are quiet. She denies seeing anyone else in the room with [place] today.  Objective:  Vital signs in last 24 hours: Vitals:   03/17/20 0945 03/17/20 1615 03/17/20 2021 03/18/20 0435  BP: 96/60 (!) 120/105 138/76 (!) 151/87  Pulse: (!) 52 (!) 42 (!) 54 69  Resp: 18 17 18 17   Temp: (!) 97.5 F (36.4 C) 98.9 F (37.2 C) 98.2 F (36.8 C) 98 F (36.7 C)  TempSrc: Oral Oral Oral Oral  SpO2: 96% 97% 98% 94%  Weight:   63.6 kg    Physical exam: General: Well developed, well noursihed, lying comfortably in bed Neuro: Alert and awake. Disoriented and confused. Unable to answer questions appropriately. Persistent dysarthria that is chronic and unchanged.  Psych: Pleasent mood and affect. Denies any visual or auditory hallucinations today, however seems to be responding to internal stimuli.  Assessment/Plan: Jessica Robinsonis a 64 y.o.with PMH of schizoaffective disorder with paranoia, HTN, HLD, DM, Depressionadmitted for acute kidney injury, improved nowandSchizoaffective disorder.  Principal Problem:   Paranoid schizophrenia, chronic condition with acute exacerbation (HCC) Active Problems:   AKI (acute kidney injury) (HCC)   DM (diabetes mellitus), type 2 (HCC)   HTN (hypertension), benign   HLD (hyperlipidemia)   Acute kidney injury (HCC)   Hallucinations   Delirium  #Schizoaffective disorder Continues to be awake at night, so will restart scheduled bed-time ativan. Patient continues to have active hallucinations and is disoriented.  - Awaiting geriatric psychiatry placement.  - Greatly appreciate psych assistance - Continue agitation protocol -Abilify to 5 mg daily - Zoloft 100mg  daily - Continue  Trazodone 300mg  qHS -Zyprexa 7.5 mgQHS - 1:1 sitter - Melatonin 3 mg QHS - Restart bedtime Ativan 0.25 mg  # Drug-induced extrapyramidal movement disorder - C/w Amantadine 100mg  BID - C/w Bentropine 1mg  AM & 0.5mg  PM  # Acute Kidney Injury, resolved # Chronic Kidney Disease 3a - Avoid nephrotoxic agents  - Encourage PO intake - Regular diet - Ensure supplement  Prior to Admission Living Arrangement:Home Anticipated Discharge Location:Inpatient psychiatry Barriers to Discharge:Disposition Dispo:Pending Disposition  Dr. 65 Internal Medicine PGY-2  Pager: [phone] After 5pm on weekdays and 1pm on weekends: On Call pager [phone]  03/18/2020, 7:02 AM

## 2020-03-18 NOTE — Progress Notes (Signed)
Patient did not sleep at all last night.

## 2020-03-19 DIAGNOSIS — I129 Hypertensive chronic kidney disease with stage 1 through stage 4 chronic kidney disease, or unspecified chronic kidney disease: Secondary | ICD-10-CM | POA: Diagnosis not present

## 2020-03-19 DIAGNOSIS — F200 Paranoid schizophrenia: Secondary | ICD-10-CM | POA: Diagnosis not present

## 2020-03-19 DIAGNOSIS — N179 Acute kidney failure, unspecified: Secondary | ICD-10-CM | POA: Diagnosis not present

## 2020-03-19 DIAGNOSIS — N1831 Chronic kidney disease, stage 3a: Secondary | ICD-10-CM | POA: Diagnosis not present

## 2020-03-19 LAB — GLUCOSE, CAPILLARY
Glucose-Capillary: 108 mg/dL — ABNORMAL HIGH (ref 70–99)
Glucose-Capillary: 133 mg/dL — ABNORMAL HIGH (ref 70–99)
Glucose-Capillary: 141 mg/dL — ABNORMAL HIGH (ref 70–99)
Glucose-Capillary: 114 mg/dL — ABNORMAL HIGH (ref 70–99)

## 2020-03-19 NOTE — Progress Notes (Signed)
   Subjective: HD#20 No acute overnight events. Patient slept whole night. Jessica Campbell evaluated at bedside this AM. She denies SI, HI, states voices she hears are not telling her to harm herself. She states she feels well this morning and ready to eat her breakfast.  Objective:  Vital signs in last 24 hours: Vitals:   03/18/20 1736 03/18/20 2049 03/19/20 0459 03/19/20 1006  BP: 109/62 (!) 153/83 117/82 108/83  Pulse:  69 81 60  Resp:    18  Temp: 98.2 F (36.8 C) 98.8 F (37.1 C) 97.6 F (36.4 C) 98.1 F (36.7 C)  TempSrc: Oral Oral Oral Oral  SpO2: 96% 97% 92% 100%  Weight:  63.4 kg     Physical exam: General: Well developed, well noursihed, lying comfortably in bed Neuro: Alert, awake and oriented to time, place & person  Psych: Pleasent mood and affect. Denies any visual or auditory hallucinations today, however seems to be responding to internal stimuli.  Assessment/Plan: Jessica Robinsonis a 64 y.o.with PMH of schizoaffective disorder with paranoia, HTN, HLD, DM, Depressionadmitted for acute kidney injury, improved nowandSchizoaffective disorder.  Principal Problem:   Paranoid schizophrenia, chronic condition with acute exacerbation (HCC) Active Problems:   AKI (acute kidney injury) (HCC)   DM (diabetes mellitus), type 2 (HCC)   HTN (hypertension), benign   HLD (hyperlipidemia)   Acute kidney injury (HCC)   Hallucinations   Delirium  #Schizoaffective disorder Patient slept well last night. She seems like responding to internal stimuli but denies any command hallucinations. Denies any suicidal or homicidal ideations.   - Awaiting geriatric psychiatry placement.  - Greatly appreciate psych assistance - Continue agitation protocol -Abilify to 5 mg daily - Zoloft 100mg  daily - Continue Trazodone 300mg  qHS -Zyprexa 7.5 mgQHS - 1:1 sitter - Melatonin 3 mg QHS -  Ativan 0.25 mg  # Drug-induced extrapyramidal movement disorder - C/w Amantadine 100mg   BID - C/w Bentropine 1mg  AM & 0.5mg  PM  # Acute Kidney Injury, resolved # Chronic Kidney Disease 3a - Avoid nephrotoxic agents  - Encourage PO intake - Regular diet - Ensure supplement  Prior to Admission Living Arrangement:Home Anticipated Discharge Location:Inpatient psychiatry Barriers to Discharge:Disposition Dispo:Pending Disposition   Jessica Campbell, , MD Pager: [phone] After 5pm on weekdays and 1pm on weekends: On Call pager [phone]  03/19/2020, 2:39 PM

## 2020-03-19 NOTE — Progress Notes (Signed)
Physical Therapy Treatment Patient Details Name: Jessica Campbell MRN: [id number] DOB: 1955/06/26 Today's Date: 03/19/2020    History of Present Illness Jessica Campbell is a 64 y.o. with PMH of schizoaffective disorder with paranoia, HTN, HLD, DM, depression admitted for hallucinations and durg induced extrapyramidal movement disorder.    PT Comments    Patient received in bed, lethargic with her sister present trying to feed her despite non-alert state; educated sister to avoid trying to feed her unless she is completely alert/interactive in order to avoid choking or aspiration pneumonia. Able to slowly rouse her, however once more awake she was much more restless and shaking today than last sesson, also looking into corners and at ceiling like she was scared of something- when asked, she tells me she is seeing things again but will not tell me what they are. Needed heavy levels of physical assistance to even get to EOB today, she was able to maintain midline sitting for a few minutes then impulsively threw herself backwards and needed totalA to safely return to and reposition in bed. Left in bed awake and interacting with sister, bed alarm active and RN/MD aware of patient status.    Follow Up Recommendations  Other (comment) (geri psych)     Equipment Recommendations  Rolling walker with 5" wheels    Recommendations for Other Services       Precautions / Restrictions Precautions Precautions: Fall Precaution Comments: visual and auditory hallucinations, multidirectional and unpredictable balance loss Restrictions Weight Bearing Restrictions: No    Mobility  Bed Mobility Overal bed mobility: Needs Assistance Bed Mobility: Supine to Sit;Sit to Supine     Supine to sit: Mod assist Sit to supine: Total assist   General bed mobility comments: ModA to get to EOB with heavy posterior lean, once up able to maintain midline with min guard to MinA then suddenly impulsively threw  herself backwards  Transfers                 General transfer comment: deferred- safety  Ambulation/Gait             General Gait Details: deferred- safety   Stairs             Wheelchair Mobility    Modified Rankin (Stroke Patients Only)       Balance Overall balance assessment: Needs assistance Sitting-balance support: Feet supported;Single extremity supported;Bilateral upper extremity supported Sitting balance-Leahy Scale: Poor Sitting balance - Comments: Min guard-totalA depending on activities Postural control: Posterior lean     Standing balance comment: deferred                            Cognition Arousal/Alertness: Lethargic Behavior During Therapy: Restless;Impulsive;Anxious (shaking, rocking) Overall Cognitive Status: Impaired/Different from baseline Area of Impairment: Orientation;Attention;Memory;Following commands;Safety/judgement;Awareness;Problem solving                 Orientation Level: Disoriented to;Place;Time;Situation Current Attention Level: Focused Memory: Decreased short-term memory Following Commands: Follows one step commands inconsistently;Follows one step commands with increased time Safety/Judgement: Decreased awareness of safety;Decreased awareness of deficits Awareness: Intellectual Problem Solving: Slow processing;Requires verbal cues;Difficulty sequencing;Decreased initiation;Requires tactile cues General Comments: lethargic at beginning of session but able to wake, much more anxious than her usual and tells me she is seeing things in the room, but won't tell me what they are. Very poor command following today. No awareness of posterior lean.      Exercises  General Comments General comments (skin integrity, edema, etc.): sister in room attempting to feed patient even tho she was not alert- educated sister to not try to feed her unless she is completely alert to avoid aspiration or choking       Pertinent Vitals/Pain Pain Assessment: Faces Faces Pain Scale: No hurt Pain Intervention(s): Limited activity within patient's tolerance;Monitored during session    Home Living                      Prior Function            PT Goals (current goals can now be found in the care plan section) Acute Rehab PT Goals Patient Stated Goal: No goals stated PT Goal Formulation: With patient Time For Goal Achievement: 03/29/20 Potential to Achieve Goals: Fair Progress towards PT goals: Not progressing toward goals - comment (limited by schizoaffective behaviors)    Frequency    Min 2X/week      PT Plan Current plan remains appropriate    Co-evaluation              AM-PAC PT "6 Clicks" Mobility   Outcome Measure  Help needed turning from your back to your side while in a flat bed without using bedrails?: A Lot Help needed moving from lying on your back to sitting on the side of a flat bed without using bedrails?: A Lot Help needed moving to and from a bed to a chair (including a wheelchair)?: Total Help needed standing up from a chair using your arms (e.g., wheelchair or bedside chair)?: Total Help needed to walk in hospital room?: Total Help needed climbing 3-5 steps with a railing? : Total 6 Click Score: 8    End of Session Equipment Utilized During Treatment: Gait belt Activity Tolerance: Other (comment) (limited by schizoaffective behaviors) Patient left: in bed;with call bell/phone within reach;with bed alarm set Nurse Communication: Mobility status;Other (comment) (RN and MD- patient with more jerking/rocking and having visual hallucinations again) PT Visit Diagnosis: Unsteadiness on feet (R26.81);History of falling (Z91.81);Other abnormalities of gait and mobility (R26.89)     Time: 1300-1316 PT Time Calculation (min) (ACUTE ONLY): 16 min  Charges:  $Therapeutic Activity: 8-22 mins                     Madelaine Etienne, DPT, PN1   Supplemental  Physical Therapist [facility]    Pager [phone] Acute Rehab Office [phone]

## 2020-03-20 DIAGNOSIS — F200 Paranoid schizophrenia: Secondary | ICD-10-CM | POA: Diagnosis not present

## 2020-03-20 LAB — GLUCOSE, CAPILLARY
Glucose-Capillary: 121 mg/dL — ABNORMAL HIGH (ref 70–99)
Glucose-Capillary: 179 mg/dL — ABNORMAL HIGH (ref 70–99)
Glucose-Capillary: 160 mg/dL — ABNORMAL HIGH (ref 70–99)
Glucose-Capillary: 111 mg/dL — ABNORMAL HIGH (ref 70–99)

## 2020-03-20 MED ORDER — ADULT MULTIVITAMIN W/MINERALS CH
1.0000 | ORAL_TABLET | Freq: Every day | ORAL | Status: DC
  Administered 2020-03-20 – 2020-04-16 (×27): 1 via ORAL
  Filled 2020-03-20 (×28): qty 1

## 2020-03-20 MED ORDER — ENSURE ENLIVE PO LIQD
237.0000 mL | Freq: Three times a day (TID) | ORAL | Status: DC
  Administered 2020-03-20 – 2020-03-22 (×6): 237 mL via ORAL
  Administered 2020-03-22: 1 mL via ORAL
  Administered 2020-03-23 – 2020-03-26 (×10): 237 mL via ORAL
  Administered 2020-03-26: 1 mL via ORAL
  Administered 2020-03-26 – 2020-03-27 (×2): 237 mL via ORAL
  Administered 2020-03-27: 1 mL via ORAL
  Administered 2020-03-27 – 2020-04-11 (×36): 237 mL via ORAL
  Administered 2020-04-11: 1 mL via ORAL
  Administered 2020-04-12 – 2020-04-16 (×12): 237 mL via ORAL

## 2020-03-20 NOTE — Consult Note (Signed)
[facility]   Reason for Consult:"Schizoaffective Disorder.'' Referring Physician:  Reymundo PollGuilloud Carolyn, MD Patient Identification: Jessica Campbell MRN:  [id number] Principal Diagnosis: Paranoid schizophrenia, chronic condition with acute exacerbation (HCC) Diagnosis:  Principal Problem:   Paranoid schizophrenia, chronic condition with acute exacerbation (HCC) Active Problems:   AKI (acute kidney injury) (HCC)   DM (diabetes mellitus), type 2 (HCC)   HTN (hypertension), benign   HLD (hyperlipidemia)   Acute kidney injury (HCC)   Hallucinations   Delirium   Total Time spent with patient: 30 minutes  Subjective: ''I feel much better.  I have a headache.'' Objective:  64 y.o. female with history of paranoid schizophrenia, who presented with decompensation of her schizophrenia.  At the time of this evaluation patient continues on the following medication Abilify 5 mg p.o. daily, olanzapine 7.5 mg p.o. nightly, amantadine 100 mg p.o. twice daily, sertraline 100 mg p.o. daily and trazodone 300 mg p.o. nightly.  Patient continues to show marked improvement in her overall symptoms to include sleep.  Patient reports improvement in her hallucinations.although she does report recently having seen her grandmother in the garden.  She describes this as a vision of her more so than a hallucination.  She reports this is something that she sees all the time.  She is lucid and able to hold a reasonable conversation, although she is very forgetful.  She does ask to repeat the question multiple times throughout the evaluation, however is able to answer all questions appropriately.  She is also alert and oriented x3.she does not appear to be responding to internal stimuli, or exhibiting any active hallucination or psychosis.  Towards the end of the evaluation patient is observed to be swatting in the air in which she reports there is a bug in the room.  This Clinical research associatewriter assured patient there were no  bugs in the room and therefore she was safe.  Patient verbalized understanding and closed her eyes.  She continues to have much difficulty finding her words, memory recall, and finishing her sentences.  Most recent example during the evaluation she is observed to be having difficult finding her words, trouble having a conversation, and losing track of the date or time.  When assisted patient orientation writer pointed at the clock on the wall and asked for the time, in which she was able to identify the correct time after 3 attempts " [id number]".  Prior to pointing out the clock patient stated it is almost 1:00, and she needs to be up doing something.  She has some impaired thought processes noted on exam however she does have have some ability to link casual relationships.  Patient is unable to tell the date despite pointing at the clock.  She is also unable to identify December and being the 12th month, however patient was able to name all 12 months of the year, and had difficulty identifying Christmas as a major holiday in December.  When assessing for hallucinations patient states "I can hear you because you allow.  "  She then casually laugh It off, reporting "I did not mean it like that".  She denies any hallucinations at this time, and does not appear to be responding to internal stimuli. She denies any suicidal thoughts.    This Clinical research associatewriter had another long discussion with her sister whom she identifies correctly as Zella BallRobin, who is also at the bedside.  Writer discussed in depth her current medications, progress and improvement patient continues to make over the  course of her 3-week lengthy admission.  Melina Schools did express concerns regarding patient returning home, as she is the primary caregiver, and the patient lives above her" upstairs".  Writer did discuss etiology and disease process of schizophrenia, as well as prognosis and overall treatment plan.  There continues to be ongoing concern regarding multiple  symptoms patient is exhibiting which at this time are consistent with schizophrenia, however suspect some underlying dementia due to her mild cognitive impairment.  Writer discussed with sister outpatient treatment recommendations to include neurology for dementia evaluation, trauma focused therapist(to address grief and loss, and bereavement).  I did interview the patient regarding the most recent deaths that have occurred, in which she was unable to identify the date or year.  This example was shown to the sister, to help her understand the degree of cognitive impairment in addition to underlying schizophrenia that is impacting this patient.  Again reviewed the goal of reducing multiple psychotropic medications, and learning to work with patient to help manage her behaviors and identifying new coping skills.  Sister appears to be open to caregiver resources and support groups, in addition to support groups for family members with serious mental illness.   Past Psychiatric History: Paranoid Schizophrenia. Has tried multiple antipsychotics to include both oral and injectable. Recently she has had multiple psychiatric admissions for her worsening hallucinations and paranoia. She denies any suicide attempts.   Risk to Self:   Denies Risk to Others:   Denies Prior Inpatient Therapy:   Yes, recently 09/20/2019 at [facility] Prior Outpatient Therapy:   [facility]  Past Medical History:  Past Medical History:  Diagnosis Date  . Borderline diabetes   . Depression   . Diabetes mellitus without complication (HCC)   . High cholesterol   . Hypertension   . Schizoaffective disorder (HCC)   . Scoliosis     Past Surgical History:  Procedure Laterality Date  . ABDOMINAL HYSTERECTOMY     Family History: No family history on file. Family Psychiatric  History: Unable to assess Social History:  Social History   Substance and Sexual Activity  Alcohol Use No     Social History   Substance and Sexual Activity   Drug Use No    Social History   Socioeconomic History  . Marital status: Single    Spouse name: Not on file  . Number of children: Not on file  . Years of education: Not on file  . Highest education level: Not on file  Occupational History  . Not on file  Tobacco Use  . Smoking status: Never Smoker  . Smokeless tobacco: Never Used  Substance and Sexual Activity  . Alcohol use: No  . Drug use: No  . Sexual activity: Not on file  Other Topics Concern  . Not on file  Social History Narrative  . Not on file   Social Determinants of Health   Financial Resource Strain: Not on file  Food Insecurity: Not on file  Transportation Needs: Not on file  Physical Activity: Not on file  Stress: Not on file  Social Connections: Not on file   Additional Social History:    Allergies:   Allergies  Allergen Reactions  . Paliperidone     Other reaction(s): Hives / Skin Rash  . Quetiapine Fumarate Rash    Other reaction(s): Hives / Skin Rash  . Risperidone Rash    Other reaction(s): Hives / Skin Rash  . Diphenhydramine Hcl     Other reaction(s): Difficulty breathing,  Hives / Skin Rash  . Latex Rash    Other reaction(s): Rash, Rash Other reaction(s): DERMATITIS Other reaction(s): DERMATITIS     Labs:  Results for orders placed or performed during the hospital encounter of 02/27/20 (from the past 48 hour(s))  Glucose, capillary     Status: Abnormal   Collection Time: 03/18/20  4:05 PM  Result Value Ref Range   Glucose-Capillary 126 (H) 70 - 99 mg/dL    Comment: Glucose reference range applies only to samples taken after fasting for at least 8 hours.  Glucose, capillary     Status: Abnormal   Collection Time: 03/18/20  8:48 PM  Result Value Ref Range   Glucose-Capillary 110 (H) 70 - 99 mg/dL    Comment: Glucose reference range applies only to samples taken after fasting for at least 8 hours.  Glucose, capillary     Status: Abnormal   Collection Time: 03/19/20  6:42 AM   Result Value Ref Range   Glucose-Capillary 108 (H) 70 - 99 mg/dL    Comment: Glucose reference range applies only to samples taken after fasting for at least 8 hours.  Glucose, capillary     Status: Abnormal   Collection Time: 03/19/20 11:41 AM  Result Value Ref Range   Glucose-Capillary 133 (H) 70 - 99 mg/dL    Comment: Glucose reference range applies only to samples taken after fasting for at least 8 hours.  Glucose, capillary     Status: Abnormal   Collection Time: 03/19/20  4:45 PM  Result Value Ref Range   Glucose-Capillary 141 (H) 70 - 99 mg/dL    Comment: Glucose reference range applies only to samples taken after fasting for at least 8 hours.  Glucose, capillary     Status: Abnormal   Collection Time: 03/19/20  9:21 PM  Result Value Ref Range   Glucose-Capillary 114 (H) 70 - 99 mg/dL    Comment: Glucose reference range applies only to samples taken after fasting for at least 8 hours.  Glucose, capillary     Status: Abnormal   Collection Time: 03/20/20  6:30 AM  Result Value Ref Range   Glucose-Capillary 121 (H) 70 - 99 mg/dL    Comment: Glucose reference range applies only to samples taken after fasting for at least 8 hours.  Glucose, capillary     Status: Abnormal   Collection Time: 03/20/20 11:05 AM  Result Value Ref Range   Glucose-Capillary 179 (H) 70 - 99 mg/dL    Comment: Glucose reference range applies only to samples taken after fasting for at least 8 hours.   Comment 1 Notify RN     Current Facility-Administered Medications  Medication Dose Route Frequency Provider Last Rate Last Admin  . acetaminophen (TYLENOL) tablet 650 mg  650 mg Oral Q6H PRN Jacques Navy, MD   650 mg at 03/03/20 2156   Or  . acetaminophen (TYLENOL) suppository 650 mg  650 mg Rectal Q6H PRN Norins, Rosalyn Gess, MD      . amantadine (SYMMETREL) capsule 100 mg  100 mg Oral BID Theotis Barrio, MD   100 mg at 03/20/20 7416  . ARIPiprazole (ABILIFY) tablet 5 mg  5 mg Oral Daily Maryagnes Amos, FNP   5 mg at 03/20/20 3845  . aspirin EC tablet 81 mg  81 mg Oral Daily Theotis Barrio, MD   81 mg at 03/20/20 3646  . atorvastatin (LIPITOR) tablet 40 mg  40 mg Oral Daily Norins, Rosalyn Gess, MD  40 mg at 03/20/20 0828  . benztropine (COGENTIN) tablet 1 mg  1 mg Oral Daily Miguel Aschoff, MD   1 mg at 03/20/20 5176   And  . benztropine (COGENTIN) tablet 0.5 mg  0.5 mg Oral QHS Miguel Aschoff, MD   0.5 mg at 03/19/20 2120  . feeding supplement (ENSURE ENLIVE / ENSURE PLUS) liquid 237 mL  237 mL Oral BID BM Dagar, Anjali, MD   237 mL at 03/20/20 1350  . heparin injection 5,000 Units  5,000 Units Subcutaneous Q8H Theotis Barrio, MD   5,000 Units at 03/20/20 1350  . insulin aspart (novoLOG) injection 0-15 Units  0-15 Units Subcutaneous TID WC Norins, Rosalyn Gess, MD   3 Units at 03/20/20 1351  . LORazepam (ATIVAN) tablet 0.25 mg  0.25 mg Oral QHS Verdene Lennert, MD   0.25 mg at 03/18/20 2132   Or  . LORazepam (ATIVAN) injection 0.25 mg  0.25 mg Intramuscular QHS Verdene Lennert, MD      . OLANZapine zydis (ZYPREXA) disintegrating tablet 10 mg  10 mg Oral Q8H PRN Dagar, Geralynn Rile, MD       And  . LORazepam (ATIVAN) tablet 1 mg  1 mg Oral PRN Dagar, Geralynn Rile, MD      . melatonin tablet 3 mg  3 mg Oral QHS Dagar, Geralynn Rile, MD   3 mg at 03/18/20 2132  . OLANZapine (ZYPREXA) tablet 7.5 mg  7.5 mg Oral QHS Maryagnes Amos, FNP   7.5 mg at 03/19/20 2120  . pantoprazole (PROTONIX) EC tablet 40 mg  40 mg Oral Daily Theotis Barrio, MD   40 mg at 03/20/20 1607  . polyethylene glycol (MIRALAX / GLYCOLAX) packet 17 g  17 g Oral BID Verdene Lennert, MD   17 g at 03/19/20 0957  . senna (SENOKOT) tablet 17.2 mg  2 tablet Oral BID Verdene Lennert, MD   17.2 mg at 03/20/20 0828  . sertraline (ZOLOFT) tablet 100 mg  100 mg Oral Daily Maryagnes Amos, FNP   100 mg at 03/20/20 3710  . traZODone (DESYREL) tablet 300 mg  300 mg Oral QHS Reymundo Poll, MD   300 mg at 03/19/20 2119     Musculoskeletal: Strength & Muscle Tone: abnormal and increased Gait & Station: unsteady Patient leans: N/A  Psychiatric Specialty Exam: Physical Exam Psychiatric:        Attention and Perception: Attention normal. She perceives visual hallucinations.        Mood and Affect: Mood normal. Affect is flat.        Speech: Speech is delayed.        Behavior: Behavior normal. Behavior is cooperative.        Thought Content: Thought content normal.        Cognition and Memory: Cognition normal.        Judgment: Judgment normal.     Review of Systems  Psychiatric/Behavioral: Positive for hallucinations.    Blood pressure (!) 140/56, pulse 80, temperature 98.8 F (37.1 C), temperature source Oral, resp. rate 16, weight 63 kg, SpO2 98 %.Body mass index is 25.4 kg/m.  General Appearance: Fairly Groomed  Eye Contact:  Fair  Speech:  Slow and garbled at times  Volume:  Normal  Mood:  Euthymic  Affect:  Appropriate and Congruent  Thought Process:  Coherent, Linear and Descriptions of Associations: Intact  Orientation:  Other:  alert and oriented x 2  Thought Content:  Logical and Hallucinations: Visual  Suicidal Thoughts:  No  Homicidal Thoughts:  No  Memory:  Immediate;   Fair Recent;   Poor Remote;   Poor  Judgement:  Other:  Improved  Insight:  Shallow  Psychomotor Activity:  Normal and Tremor  Concentration:  Concentration: Fair and Attention Span: Fair  Recall:  [employer] of Knowledge:  Fair  Language:  Fair  Akathisia:  No  Handed:  Right  AIMS (if indicated):     Assets:  Communication Skills Desire for Improvement Financial Resources/Insurance Housing Leisure Time Physical Health  ADL's:  Impaired  Cognition:  Impaired,  Mild  Sleep:     Despite her primary diagnosis of schizophrenia, she was presenting with new onset of delusions, variable course of psychosis, agitation, restlessness, and cognitive impairment that poses difficulty in achieving complete  remission of her symptoms.  During this lengthy course of admission we have been cross titrated antipsychotics from Abilify to olanzapine, in which she is markedly improved.  As discussed with sister, complete resolution or cessation of her symptoms, may not be obtainable in an inpatient setting.  We should also consider patient with tardive dyskinesia will present with some restlessness, agitation that can be managed with frequent reorientation and redirection.  Patient with evident mild cognitive impairment, that will benefit from neurological evaluation for dementia. Distinguishing between pre-existing schizophrenia, neurobehavioral disturbances, depression, posttraumatic stress (found husband down on the floor died from an aneurysm ) and bereavement, will be quite challenging, therefore the goal will be to treat accordingly with some reduction of symptoms.  Overall we have made great strides to improve and reduce her neurobehavioral disturbances, to include psychosis and delusions, hallucinations, anxiety and agitation.  As previously discussed patient is exhibiting some normal behaviors schizophrenia, dementia, and tardive dyskinesia her agitation and jitteriness do not call for additional as needed or psychotropic medications.  When patient receives these additional medication she has a tendency to sleep during the day, and is awake at night.  Please continue to limit use as needed medications, for target symptom management.   Treatment Plan Summary: -Discontinue aripiprazole at this time, patient appears to be responding well to olanzapine 7.5 mg p.o. nightly at bedtime. -Will also discontinue as needed agitation protocol, as patient has not exhibited any additional agitation has noted since last week. -Continue 1:1 sitter as deemed necessary by the treatment team. -Continue delirium precautions, as patient will continue to require frequent orientation and redirection as these are normal behaviors and  at times do not require additional medication. - Continue working closely with SW to facilitate placement at inpatient gero-psych facility.  - TD symptoms have improved:continue Amantadine and Benztropine.     -Patient appears to have some mild cognitive impairment, also does not appear to have the capacity to make medical decision due to her current limited mentation.   Disposition: Recommend psychiatric Inpatient admission when medically cleared.  Maryagnes Amos, FNP 03/05/2020 3:24PM

## 2020-03-20 NOTE — Progress Notes (Signed)
Occupational Therapy Treatment Patient Details Name: Jessica Campbell MRN: [id number] DOB: 1955-06-03 Today's Date: 03/20/2020    History of present illness Jessica Campbell is a 64 y.o. with PMH of schizoaffective disorder with paranoia, HTN, HLD, DM, depression admitted for hallucinations and durg induced extrapyramidal movement disorder.   OT comments  Pt completed multiple sit<>stand this session with min (A) posterior bias noted. Pt pleasantly confused but responding well to christmas music during session. Pt attempting to sing and dance at times appropriately. Pt recalling memories by music that sister related to during this time. Recommendation for SNF / psych facility. Pt with care plan updated. Recommendation for OOB with staff for meals to help with activity tolerance stand pivot.    Follow Up Recommendations  SNF    Equipment Recommendations  3 in 1 bedside commode;Other (comment) (RW)    Recommendations for Other Services      Precautions / Restrictions Precautions Precautions: Fall Precaution Comments: visual and auditory hallucinations, multidirectional and unpredictable balance loss       Mobility Bed Mobility Overal bed mobility: Needs Assistance Bed Mobility: Supine to Sit;Sit to Supine     Supine to sit: Min assist;HOB elevated Sit to supine: Min assist   General bed mobility comments: pt able to help (A) with scooting to the top of the bed with bil LE with min cues. pt with bed tilted to (A) the reposition. Pt requires mod cues to progress toward EOB  Transfers Overall transfer level: Needs assistance Equipment used: 1 person hand held assist Transfers: Sit to/from Stand Sit to Stand: Min assist         General transfer comment: pt able to power up with posterior bias. pt benefits from counting to help with initiation. pt with trunk rotation at times due to visual hallunication tracking with cervical rotation that increased (A) level required. pt  completed sit<>stand x5 during session. pt demonstrates weight shift and attempting to dance to music    Balance Overall balance assessment: Needs assistance Sitting-balance support: Bilateral upper extremity supported;Feet supported Sitting balance-Leahy Scale: Poor Sitting balance - Comments: pt min guard to max (A). when pt distracted and pulling at gown max (A). when pt engaged and visually attending min guard (A)   Standing balance support: Bilateral upper extremity supported;During functional activity Standing balance-Leahy Scale: Poor Standing balance comment: reliant on therapist                           ADL either performed or assessed with clinical judgement   ADL Overall ADL's : Needs assistance/impaired                     Lower Body Dressing: Supervision/safety;Bed level Lower Body Dressing Details (indicate cue type and reason): able to pull up socks in figure 4 position Toilet Transfer: Moderate assistance Toilet Transfer Details (indicate cue type and reason): requires balance from therapist hand held and very distracted with hallunications           General ADL Comments: pt with christmas music used to help with focus toward therapist and music to help with weight shift for static sitting. pt able to sustain eob sitting for 45 seconds - 1 minute with music sustained attention. pt very easily distracted with hallunications and pulling at gown.     Vision       Perception     Praxis      Cognition Arousal/Alertness: Awake/alert Behavior During Therapy:  Impulsive;Restless Overall Cognitive Status: History of cognitive impairments - at baseline                                 General Comments: pt reports a baby and person being in room that is not present. pt calling sister by the wrong name and then later refers to her correctly as [name]. Pt singing "here comes [place]" correctly showing recall. pt states "come on  saturdays" in response to hearing [name] sing and correctly reports singer from memory        Exercises     Shoulder Instructions       General Comments      Pertinent Vitals/ Pain       Pain Assessment: No/denies pain  Home Living                                          Prior Functioning/Environment              Frequency  Min 2X/week        Progress Toward Goals  OT Goals(current goals can now be found in the care plan section)  Progress towards OT goals: Progressing toward goals;Goals drowngraded-see care plan  Acute Rehab OT Goals Patient Stated Goal: pt wanting to work on activity blanket in room ( buttons/ zippes, snaps that is given by RN staff) OT Goal Formulation: With patient/family Time For Goal Achievement: 04/03/20 Potential to Achieve Goals: Good ADL Goals Pt Will Perform Lower Body Dressing: sit to/from stand;with supervision Pt Will Perform Toileting - Clothing Manipulation and hygiene: sit to/from stand;with supervision Pt Will Perform Tub/Shower Transfer: grab bars (NA) Additional ADL Goal #1: pt will demonstrate 2 step command 2 out 4 attempts  Plan Discharge plan remains appropriate    Co-evaluation                 AM-PAC OT "6 Clicks" Daily Activity     Outcome Measure   Help from another person eating meals?: A Little Help from another person taking care of personal grooming?: A Lot Help from another person toileting, which includes using toliet, bedpan, or urinal?: A Lot Help from another person bathing (including washing, rinsing, drying)?: A Lot Help from another person to put on and taking off regular upper body clothing?: A Lot Help from another person to put on and taking off regular lower body clothing?: A Lot 6 Click Score: 13    End of Session    OT Visit Diagnosis: Unsteadiness on feet (R26.81);History of falling (Z91.81);Other symptoms and signs involving cognitive function    Activity Tolerance Patient tolerated treatment well   Patient Left in bed;with call bell/phone within reach;with bed alarm set;with family/visitor present (sister [name])   Nurse Communication Mobility status;Precautions        Time: [phone] OT Time Calculation (min): 16 min  Charges: OT General Charges $OT Visit: 1 Visit OT Treatments $Therapeutic Activity: 8-22 mins   Brynn, OTR/L  Acute Rehabilitation Services Pager: [phone] Office: [phone] .    Mateo Flow 03/20/2020, 4:17 PM

## 2020-03-20 NOTE — TOC Progression Note (Addendum)
Transition of Care [facility]) - Progression Note    Patient Details  Name: Jessica Campbell MRN: [id number] Date of Birth: December 15, 1955  [facility] of [facility]) CM/SW Contact  Okey Dupre Lazaro Arms, LCSW Phone Number: 03/20/2020, 2:48 PM  Clinical Narrative: Psychiatric facility placement search continues:  1. [facility] referral line - [phone]: They are currently at capacity. CSW advised that they do not have an age cut-off, they review each patient's clinicals to determine appropriateness for admission.   2. [facility] [phone]: Talked with Shanda Bumps in patient placement and they currently have no beds and have a wait list. 3. [facility] in [place] - [phone]: Talked with Mikayla. Intake person who indicted that they currently have no psych beds. Edgardo Roys was informed that a referral had been sent on patient and CSW informed that they discard clinicals after 24 hours. 4Charlott Holler [phone]: Message left regarding patient. 5. [facility] [phone]: Got voice mail that gave instructions to not leave a message and to e-fax clinicals to [phone]. Clinicals faxed.   Psychiatric facility search will continue.       Expected Discharge Plan and Services - Psychiatric hospital placement                                               Social Determinants of Health (SDOH) Interventions  Needs psych placement   Readmission Risk Interventions No flowsheet data found.

## 2020-03-20 NOTE — Progress Notes (Addendum)
   Subjective: HD#21 No acute overnight events. Jessica Campbell evaluated at bedside this AM. She states voices are sometimes mean to her but not telling her to hurt anyone. She states a tall white man is in the room and just staring at him. Notes liked her breakfast and slept well last night.   Objective:  Vital signs in last 24 hours: Vitals:   03/19/20 1706 03/19/20 2122 03/20/20 0456 03/20/20 0930  BP: (!) 143/63 112/72 130/76 (!) 140/56  Pulse: (!) 50 (!) 57 64 80  Resp: 17 18 18 16   Temp: 98.7 F (37.1 C) 98.6 F (37 C) 98.4 F (36.9 C) 98.8 F (37.1 C)  TempSrc: Oral Oral Oral Oral  SpO2: 98% 94% 97% 98%  Weight:  63 kg     Physical exam: General: Well developed, well noursihed, lying comfortably in bed Neuro: Alert, awake and oriented to time and person, not to the place. Psych: Pleasent mood and affect. Admits to visual and auditory hallucinations today and  seems to be responding to internal stimuli.  Assessment/Plan: Jessica Robinsonis a 64 y.o.with PMH of schizoaffective disorder with paranoia, HTN, HLD, DM, Depressionadmitted for acute kidney injury, improved nowandSchizoaffective disorder.  Principal Problem:   Paranoid schizophrenia, chronic condition with acute exacerbation (HCC) Active Problems:   AKI (acute kidney injury) (HCC)   DM (diabetes mellitus), type 2 (HCC)   HTN (hypertension), benign   HLD (hyperlipidemia)   Acute kidney injury (HCC)   Hallucinations   Delirium  #Schizoaffective disorder Night RN reported patient slept a lot in day yesterday and slept on and off whole night but plan is to keep her awake during the day. She seems like responding to internal stimuli but denies any command hallucinations. Denies any suicidal or homicidal ideations. SW is working hard to find patient inpatient psych bed and greatly appreciate her hard work. Waiting for psych consult and recommendations.  - Awaiting geriatric psychiatry placement.  - Greatly  appreciate psych assistance - Continue agitation protocol -Abilify to 5 mg daily - Zoloft 100mg  daily - Continue Trazodone 300mg  qHS -Zyprexa 7.5 mgQHS - Melatonin 3 mg QHS -  Ativan 0.25 mg  # Drug-induced extrapyramidal movement disorder - C/w Amantadine 100mg  BID - C/w Bentropine 1mg  AM & 0.5mg  PM  # Acute Kidney Injury, resolved # Chronic Kidney Disease 3a - Avoid nephrotoxic agents  - Encourage PO intake - Regular diet - Ensure supplement  Prior to Admission Living Arrangement:Home Anticipated Discharge Location:Inpatient psychiatry Barriers to Discharge:Disposition Dispo:Pending Disposition  Jessica Campbell, 65, MD 03/20/2020, 11:49 AM Pager: [phone] After 5pm on weekdays and 1pm on weekends: On Call pager [phone]

## 2020-03-20 NOTE — Progress Notes (Signed)
Initial Nutrition Assessment  DOCUMENTATION CODES:   Not applicable  INTERVENTION:   Ensure Enlive/Plus po TID, each supplement provides 350 kcal and 20 grams of protein (Ensure Plus only has 13 grams of protein per serving)  MVI daily  Magic cup TID with meals, each supplement provides 290 kcal and 9 grams of protein   NUTRITION DIAGNOSIS:   Inadequate oral intake related to lethargy/confusion,other (see comment) (limited food acceptance) as evidenced by meal completion < 50%.  GOAL:   Patient will meet greater than or equal to 90% of their needs  MONITOR:   PO intake,Supplement acceptance,Labs,Weight trends,I & O's  REASON FOR ASSESSMENT:   LOS   ASSESSMENT:   Pt admitted with AKI (now resolved) and schizoaffective disorder. PMH includes schizoaffective disorder with paranoia, HTN, HLD, DM, and depression.  Pt is pending placement in inpatient geriatric psychiatric facility.   Pt unavailable at time of RD visit. Per H&P, pt had been living with her sister PTA. Pt's sister brought her to the ED due to pt having hallucinations.  Pt was admitted for similar symptoms in [place] in June 2021 and was transferred to an inpatient psychiatric facility when she was stable. Unclear at this time how long the pt was at that facility before going to live with her sister.   Discussed pt with RN. Pt has had variable intake throughout her admission based on mentation, sleep cycle, and food preferences. RN notes that pt slept a lot of yesterday during the day, but RN was hoping to keep the pt more alert/awake today.   PO Intake: 0-100% meal completion x last 8 recorded meals (46% average meal intake). Pt currently with orders for Ensure Enlive po BID which, per RN documentation, she is consuming well.   Reviewed weight history. Per Care Everywhere, pt weighed 161 lbs on 10/07/19. Question accuracy of this weight as it appears to have been estimated rather than measured. Pt weighed  150.57lbs upon admission. If pt's weight from 10/07/19 is accurate, it would indicate a 6.48% wt loss x4 months, which is insignificant for time frame. Pt is noted to weigh 138.89 lbs now, which would be a 13.7% weight loss x5 months, which would be significant for timeframe if all weights are accurate. Will attempt to confirm weight loss at follow-up. Will also attempt nutrition-focused physical exam.   UOP: x24 hours  Labs reviewed. CBGs [phone] Medications: ss novolog TID w/ meals, miralax, senokot  NUTRITION - FOCUSED PHYSICAL EXAM:  Unable to perform at this time. Will attempt at follow-up.   Diet Order:   Diet Order            Diet regular Room service appropriate? Yes; Fluid consistency: Thin  Diet effective now                 EDUCATION NEEDS:   Not appropriate for education at this time  Skin:  Skin Assessment: Reviewed RN Assessment  Last BM:  03/18/20 type 1  Height:   Ht Readings from Last 1 Encounters:  02/09/18 5\' 2"  (1.575 m)    Weight:   Wt Readings from Last 1 Encounters:  03/19/20 63 kg    BMI:  Body mass index is 25.4 kg/m.  Estimated Nutritional Needs:   Kcal:  1700-1900  Protein:  85-95 grams  Fluid:  >/=1.7L/d    03/21/20, MS, RD, LDN RD pager number and weekend/on-call pager number located in [place].

## 2020-03-21 DIAGNOSIS — I129 Hypertensive chronic kidney disease with stage 1 through stage 4 chronic kidney disease, or unspecified chronic kidney disease: Secondary | ICD-10-CM | POA: Diagnosis not present

## 2020-03-21 DIAGNOSIS — N1831 Chronic kidney disease, stage 3a: Secondary | ICD-10-CM | POA: Diagnosis not present

## 2020-03-21 DIAGNOSIS — F200 Paranoid schizophrenia: Secondary | ICD-10-CM | POA: Diagnosis not present

## 2020-03-21 DIAGNOSIS — N179 Acute kidney failure, unspecified: Secondary | ICD-10-CM | POA: Diagnosis not present

## 2020-03-21 LAB — GLUCOSE, CAPILLARY
Glucose-Capillary: 131 mg/dL — ABNORMAL HIGH (ref 70–99)
Glucose-Capillary: 138 mg/dL — ABNORMAL HIGH (ref 70–99)
Glucose-Capillary: 186 mg/dL — ABNORMAL HIGH (ref 70–99)
Glucose-Capillary: 138 mg/dL — ABNORMAL HIGH (ref 70–99)

## 2020-03-21 MED ORDER — LISINOPRIL 20 MG PO TABS
20.0000 mg | ORAL_TABLET | Freq: Every day | ORAL | Status: DC
  Administered 2020-03-21 – 2020-03-30 (×9): 20 mg via ORAL
  Filled 2020-03-21 (×10): qty 1

## 2020-03-21 NOTE — Progress Notes (Signed)
Occupational Therapy Treatment Patient Details Name: Jahniah Pallas MRN: [id number] DOB: [birth year] Today's Date: 03/21/2020    History of present illness Jesicca Boehning is a 64 y.o. with PMH of schizoaffective disorder with paranoia, HTN, HLD, DM, depression admitted for hallucinations and durg induced extrapyramidal movement disorder.   OT comments  Pt making gradual progress towards OT goals this session. Pt restless upon arrival but able to greet therapist and state pts name. Pt continues to be pleasantly confused. Session focus on BADL reeducation, static sitting and standing balance. Pt requires up to MAX A for sitting balance when pt distracted by extraneous stimuli losing balance in all directions, however when pt focused on task pt able to static sit with min guard. Pt initially receptive to multimodal cues but then becomes distracted. Pt required up to MAX A for static standing balance with strong posterior lean, utilized RW to assist with anterior weight shift with no success. Pt currently requires set- up to MIN A for UB ADLs needing cues for safety as pt wanting to swallow toothpaste, pt able to adjust socks from EOB supervision from long sitting. Pt would continue to benefit from skilled occupational therapy while admitted and after d/c to address the below listed limitations in order to improve overall functional mobility and facilitate independence with BADL participation. DC plan remains appropriate, will follow acutely per POC.      Follow Up Recommendations  SNF    Equipment Recommendations  3 in 1 bedside commode;Other (comment) (RW)    Recommendations for Other Services      Precautions / Restrictions Precautions Precautions: Fall Precaution Comments: visual and auditory hallucinations, multidirectional and unpredictable balance loss Restrictions Weight Bearing Restrictions: No       Mobility Bed Mobility Overal bed mobility: Needs Assistance Bed Mobility:  Supine to Sit;Sit to Supine     Supine to sit: Min guard;HOB elevated Sit to supine: Min assist;HOB elevated   General bed mobility comments: pt requried MIN guard to transition from supine>sitting mostly for safety secondary to impulsivity. pt requried MIN A to return to supine as pt needed tactile cues to sequenc steps  Transfers Overall transfer level: Needs assistance Equipment used: 1 person hand held assist Transfers: Sit to/from Stand Sit to Stand: Mod assist;Min assist         General transfer comment: pt completed x5 sit<>stands during session with pt needing up to MOD A to power into standing. pt with heavy posterior lean in standing. trialed sit<>stand with RW with little carryover. utilized mutlilmodal cues to position pts BLES and elevate pts trunk in standing with pt initially carrying over cue but then noted to become distracted and unable to maintain corrections.    Balance Overall balance assessment: Needs assistance Sitting-balance support: Bilateral upper extremity supported;Feet supported Sitting balance-Leahy Scale: Poor Sitting balance - Comments: pt min guard to max (A). when pt distracted and pulling at gown max (A). when pt engaged and visually attending min guard (A) Postural control:  (lob in all directions) Standing balance support: Bilateral upper extremity supported;During functional activity Standing balance-Leahy Scale: Poor Standing balance comment: reliant on therapist; posterior lean                           ADL either performed or assessed with clinical judgement   ADL       Grooming: Sitting;Oral care;Supervision/safety;Set up;Minimal assistance;Cueing for safety;Cueing for sequencing Grooming Details (indicate cue type and reason): MIN A for sitting  balance EOB, supervision for safety and cues for sequencing as pt noted to try to swallow toothpaste             Lower Body Dressing: Supervision/safety;Bed level Lower Body  Dressing Details (indicate cue type and reason): able to pull up socks from long sitting but requries cues to attend to task as pt will begin to initiate task but then becomes distracted Toilet Transfer: Moderate assistance;RW Toilet Transfer Details (indicate cue type and reason): simulated via functional mobility however focused only on sit<>stand as pt with heavy posterior lean in standing, attempted with and without RW         Functional mobility during ADLs: Moderate assistance (sit<>stands only) General ADL Comments: pt presents with imparied sitting and standing balance with heavy posterior lean from sitting and standing, pt continues to be easily distracted and tactile throuhgout session.pt following commands but requries cues to sustain attention to task as pt very distracted during session     Vision       Perception     Praxis      Cognition Arousal/Alertness: Awake/alert Behavior During Therapy: Restless;Impulsive Overall Cognitive Status: History of cognitive impairments - at baseline                                 General Comments: pt able to state name but disoriented to location, easily distracted during session by hallucinations both visual and auditory. following commands initially but then becomes distracted and terminates task        Exercises     Shoulder Instructions       General Comments      Pertinent Vitals/ Pain       Pain Assessment: Faces Faces Pain Scale: Hurts a little bit Pain Location: back, points to OTA back to indicate towards the middle of her back Pain Descriptors / Indicators: Discomfort Pain Intervention(s): Limited activity within patient's tolerance;Monitored during session  Home Living                                          Prior Functioning/Environment              Frequency  Min 2X/week        Progress Toward Goals  OT Goals(current goals can now be found in the care plan  section)  Progress towards OT goals: Progressing toward goals  Acute Rehab OT Goals Patient Stated Goal: pt wanting to work on activity blanket in room ( buttons/ zippes, snaps that is given by RN staff) OT Goal Formulation: With patient/family Time For Goal Achievement: 04/03/20 Potential to Achieve Goals: Good  Plan Discharge plan remains appropriate;Frequency remains appropriate    Co-evaluation                 AM-PAC OT "6 Clicks" Daily Activity     Outcome Measure   Help from another person eating meals?: A Little Help from another person taking care of personal grooming?: A Lot Help from another person toileting, which includes using toliet, bedpan, or urinal?: A Lot Help from another person bathing (including washing, rinsing, drying)?: A Lot Help from another person to put on and taking off regular upper body clothing?: A Lot Help from another person to put on and taking off regular lower body clothing?: A Lot 6 Click Score:  13    End of Session Equipment Utilized During Treatment: Gait belt;Rolling walker  OT Visit Diagnosis: Unsteadiness on feet (R26.81);History of falling (Z91.81);Other symptoms and signs involving cognitive function   Activity Tolerance Patient tolerated treatment well   Patient Left in bed;with call bell/phone within reach;with bed alarm set   Nurse Communication Mobility status        Time: 8413-2440 OT Time Calculation (min): 16 min  Charges: OT General Charges $OT Visit: 1 Visit OT Treatments $Self Care/Home Management : 8-22 mins  Audery Amel., COTA/L Acute Rehabilitation Services [phone]    Angelina Pih 03/21/2020, 12:55 PM

## 2020-03-21 NOTE — Plan of Care (Signed)
  Problem: Education: Goal: Knowledge of General Education information will improve Description Including pain rating scale, medication(s)/side effects and non-pharmacologic comfort measures Outcome: Progressing   

## 2020-03-21 NOTE — Progress Notes (Signed)
   Subjective: [name] Patient reports doing well today. She reports eating well. Reports that voices are not being mean today, denies SI or HI.   Objective:  Vital signs in last 24 hours: Vitals:   03/21/20 0447 03/21/20 0641 03/21/20 0646 03/21/20 1019  BP: (!) 145/127 (!) 140/107 (!) 150/74 (!) 128/96  Pulse: (!) 57 64 (!) 57 66  Resp: 18   16  Temp: 98.4 F (36.9 C)   98.4 F (36.9 C)  TempSrc:    Oral  SpO2: 99% 99%  98%  Weight:       General :Middles aged female, NAD, laying in bed Cardiac: RRR, no m/r/g Neuro: AAO*3 Abdomen: Soft, non-tender, non-distended Psych: Pleasant mood and affect, seems responding to internal stimuli  Assessment/Plan: Jessica Robinsonis a 64 y.o.with PMH of schizoaffective disorder with paranoia, HTN, HLD, DM, Depressionadmitted for acute kidney injury, improved nowandSchizoaffective disorder.  Principal Problem:   Paranoid schizophrenia, chronic condition with acute exacerbation (HCC) Active Problems:   AKI (acute kidney injury) (HCC)   DM (diabetes mellitus), type 2 (HCC)   HTN (hypertension), benign   HLD (hyperlipidemia)   Acute kidney injury (HCC)   Hallucinations   Delirium  #Schizoaffective disorder Psych consulted yesterday and they note new-onset delusions, ongoing psychosis and cognitive impairment. She seems like responding to internal stimuli but denies any command hallucinations. Denies any suicidal or homicidal ideations.SW is working hard to find patient inpatient psych bed and greatly appreciate her hard work.  - Awaiting geriatric psychiatry placement.  - Greatly appreciate psych assistance - D/C agitation protocol -D/C Abilify to 5 mg daily - Zoloft 100mg  daily - Continue Trazodone 300mg  qHS -Zyprexa 7.5 mgQHS - Melatonin 3 mg QHS - Ativan 0.25 mg PO or IM QHS  # Drug-induced extrapyramidal movement disorder - C/w Amantadine 100mg  BID - C/w Bentropine 1mg  AM & 0.5mg  PM  # Acute Kidney Injury,  resolved # Chronic Kidney Disease 3a - Avoid nephrotoxic agents  - Encourage PO intake - Regular diet - Ensure supplement  Prior to Admission Living Arrangement:Home Anticipated Discharge Location:Inpatient psychiatry Barriers to Discharge:Disposition Dispo:Pending Disposition   Jessica Campbell, , MD 03/21/2020, 11:50 AM Pager: [phone] After 5pm on weekdays and 1pm on weekends: On Call pager [phone]

## 2020-03-22 LAB — GLUCOSE, CAPILLARY
Glucose-Capillary: 131 mg/dL — ABNORMAL HIGH (ref 70–99)
Glucose-Capillary: 208 mg/dL — ABNORMAL HIGH (ref 70–99)

## 2020-03-22 MED ORDER — WHITE PETROLATUM EX OINT
TOPICAL_OINTMENT | CUTANEOUS | Status: AC
  Filled 2020-03-22: qty 28.35

## 2020-03-22 NOTE — Progress Notes (Signed)
° °  Subjective: HD#23 No acute overnight events. [name] evaluated at bedside this AM. She admits to hearing voices, making strange comments and seeing unusual things.   Objective:  Vital signs in last 24 hours: Vitals:   03/21/20 1019 03/21/20 1700 03/21/20 2013 03/22/20 0422  BP: (!) 128/96 (!) 99/53 101/73 118/88  Pulse: 66 66 77 92  Resp: 16 18 18 18   Temp: 98.4 F (36.9 C) 98.2 F (36.8 C) 99.2 F (37.3 C) 98 F (36.7 C)  TempSrc: Oral Oral Oral Oral  SpO2: 98% 98% 98% 98%  Weight:   65 kg    Physical exam: General :Well developed, well nourished, middle aged female laying comfortably in bed, NAD. Cv: RRR, S1, S2 normal, No m/r/g Neuro: AAO*3 Abdomen: Soft, non-tender, non-distended Psych: Normal mood and affect, seems responding to internal stimuli   Assessment/Plan: Jessica Robinsonis a 64 y.o.with PMH of schizoaffective disorder with paranoia, HTN, HLD, DM, Depressionadmitted for acute kidney injury, improved nowandSchizoaffective disorder.  Principal Problem:   Paranoid schizophrenia, chronic condition with acute exacerbation (HCC) Active Problems:   AKI (acute kidney injury) (HCC)   DM (diabetes mellitus), type 2 (HCC)   HTN (hypertension), benign   HLD (hyperlipidemia)   Acute kidney injury (HCC)   Hallucinations   Delirium  #Schizoaffective disorder Patient seems like responding to internal stimuli. Denies suicidal and homicidal ideations. Admits to auditory and visual hallucinations  - Awaiting geriatric psychiatry placement.  - Zoloft 100mg  daily - Continue Trazodone 300mg  qHS -Zyprexa 7.5 mgQHS - Melatonin 3 mg QHS - Ativan 0.25 mg PO or IM QHS  # Drug-induced extrapyramidal movement disorder - C/w Amantadine 100mg  BID - C/w Bentropine 1mg  AM & 0.5mg  PM  # Acute Kidney Injury, resolved # Chronic Kidney Disease 3a - Avoid nephrotoxic agents  - Encourage PO intake - Regular diet - Ensure supplement  Prior to Admission  Living Arrangement:Home Anticipated Discharge Location:Inpatient psychiatry Barriers to Discharge:Disposition Dispo:Pending Disposition   Jessica Campbell, 64, MD 03/22/2020, 1:12 PM Pager: [phone] After 5pm on weekdays and 1pm on weekends: On Call pager [phone]

## 2020-03-22 NOTE — Progress Notes (Signed)
Physical Therapy Treatment Patient Details Name: Jessica Campbell MRN: [id number] DOB: 1955/08/27 Today's Date: 03/22/2020    History of Present Illness Jessica Campbell is a 64 y.o. with PMH of schizoaffective disorder with paranoia, HTN, HLD, DM, depression admitted for hallucinations and durg induced extrapyramidal movement disorder.    PT Comments    Patient received in bed, lethargic but able to be woken with multimodal stimulation. Very impaired and impacted by schizoaffective behaviors today- did not respond to even simple cues such as "touch your nose" and was very internally distracted by hallucinations during session. Physically resisted attempts at mobility, then at one point sat straight up into long sitting with MinA in an moment of clarity before needing to lay back down due to jerking/rocking and seeming to experience more hallucinations. Left in bed positioned to comfort, bed alarm active. Will continue to follow acutely, if current trend with increased hallucinations/inability to follow cues continue may drop frequency until she is able to participate in meaningful tasks more consistently.     Follow Up Recommendations  Other (comment) (geripsych)     Equipment Recommendations  Rolling walker with 5" wheels    Recommendations for Other Services       Precautions / Restrictions Precautions Precautions: Fall Precaution Comments: visual and auditory hallucinations, multidirectional and unpredictable balance loss Restrictions Weight Bearing Restrictions: No    Mobility  Bed Mobility Overal bed mobility: Needs Assistance Bed Mobility: Supine to Sit;Sit to Supine     Supine to sit: Min assist Sit to supine: Min assist   General bed mobility comments: initially physically resisted attempts at mobility, then impulsively sat straight up into long sitting with MinA during a moment of clarity- this lasted about 30 seconds to a minute before she became very distracted by  hallucinations again and we had to lay back down  Transfers                 General transfer comment: deferred- safety  Ambulation/Gait             General Gait Details: deferred- safety   Stairs             Wheelchair Mobility    Modified Rankin (Stroke Patients Only)       Balance Overall balance assessment: Needs assistance Sitting-balance support: Bilateral upper extremity supported;Feet supported Sitting balance-Leahy Scale: Poor Sitting balance - Comments: pt min guard to max (A). when pt distracted and pulling at gown max (A). when pt engaged and visually attending min guard (A)                                    Cognition Arousal/Alertness: Suspect due to medications Behavior During Therapy: Restless;Anxious Overall Cognitive Status: History of cognitive impairments - at baseline Area of Impairment: Orientation;Attention;Memory;Following commands;Safety/judgement;Awareness;Problem solving                 Orientation Level: Disoriented to;Place;Time;Situation Current Attention Level: Focused Memory: Decreased short-term memory Following Commands: Follows one step commands inconsistently Safety/Judgement: Decreased awareness of safety;Decreased awareness of deficits Awareness: Intellectual Problem Solving: Slow processing;Requires verbal cues;Difficulty sequencing;Decreased initiation;Requires tactile cues General Comments: very disoriented today, did not follow simple cues and physically resisted attempts at mobility. Seemed very easily distracted during session by visual and auditory hallucinations. At one point had a moment of clarity where she sat straight up then went straight back to internal distraction from hallucinations  Exercises      General Comments        Pertinent Vitals/Pain Pain Assessment: Faces Faces Pain Scale: Hurts a little bit Pain Location: generalized discomfort Pain Descriptors /  Indicators: Discomfort Pain Intervention(s): Limited activity within patient's tolerance;Monitored during session    Home Living                      Prior Function            PT Goals (current goals can now be found in the care plan section) Acute Rehab PT Goals Patient Stated Goal: none stated PT Goal Formulation: With patient/family Time For Goal Achievement: 03/29/20 Potential to Achieve Goals: Fair Progress towards PT goals: Not progressing toward goals - comment (limited by schizoaffective behaviors)    Frequency    Min 2X/week      PT Plan Current plan remains appropriate    Co-evaluation              AM-PAC PT "6 Clicks" Mobility   Outcome Measure  Help needed turning from your back to your side while in a flat bed without using bedrails?: A Lot Help needed moving from lying on your back to sitting on the side of a flat bed without using bedrails?: A Lot Help needed moving to and from a bed to a chair (including a wheelchair)?: Total Help needed standing up from a chair using your arms (e.g., wheelchair or bedside chair)?: Total Help needed to walk in hospital room?: Total Help needed climbing 3-5 steps with a railing? : Total 6 Click Score: 8    End of Session   Activity Tolerance: Other (comment) (limited by schizoaffective behaviors) Patient left: in bed;with call bell/phone within reach;with bed alarm set Nurse Communication: Mobility status PT Visit Diagnosis: Unsteadiness on feet (R26.81);History of falling (Z91.81);Other abnormalities of gait and mobility (R26.89)     Time: 1445-1500 PT Time Calculation (min) (ACUTE ONLY): 15 min  Charges:                        Madelaine Etienne, DPT, PN1   Supplemental Physical Therapist [facility]    Pager [phone] Acute Rehab Office [phone]

## 2020-03-23 DIAGNOSIS — I129 Hypertensive chronic kidney disease with stage 1 through stage 4 chronic kidney disease, or unspecified chronic kidney disease: Secondary | ICD-10-CM | POA: Diagnosis not present

## 2020-03-23 DIAGNOSIS — N179 Acute kidney failure, unspecified: Secondary | ICD-10-CM | POA: Diagnosis not present

## 2020-03-23 DIAGNOSIS — N1831 Chronic kidney disease, stage 3a: Secondary | ICD-10-CM | POA: Diagnosis not present

## 2020-03-23 DIAGNOSIS — F200 Paranoid schizophrenia: Secondary | ICD-10-CM | POA: Diagnosis not present

## 2020-03-23 LAB — GLUCOSE, CAPILLARY
Glucose-Capillary: 167 mg/dL — ABNORMAL HIGH (ref 70–99)
Glucose-Capillary: 141 mg/dL — ABNORMAL HIGH (ref 70–99)

## 2020-03-23 NOTE — Plan of Care (Signed)
  Problem: Education: Goal: Knowledge of General Education information will improve Description: Including pain rating scale, medication(s)/side effects and non-pharmacologic comfort measures 03/23/2020 1721 by Myrtis Hopping, RN Outcome: Progressing 03/23/2020 1721 by Myrtis Hopping, RN Outcome: Progressing

## 2020-03-23 NOTE — Progress Notes (Signed)
   Subjective:   No acute overnight events.  [name] evaluated at bedside this AM.She is very sleepy and only responds with nodding her head.   Objective:  Vital signs in last 24 hours: Vitals:   03/22/20 1720 03/22/20 2043 03/23/20 0510 03/23/20 0925  BP: (!) 127/47 (!) 148/112 (!) 118/54 (!) 129/57  Pulse: 83 (!) 55 (!) 49 (!) 57  Resp: 17 16 17 18   Temp: 98.4 F (36.9 C) 98.8 F (37.1 C) 98.8 F (37.1 C) 98.4 F (36.9 C)  TempSrc: Oral Oral Oral   SpO2: 98% 97% 96% 99%  Weight:       Physical exam: General :Well developed, well nourished, middle aged female laying comfortably in bed, NAD.  Assessment/Plan: Jessica Robinsonis a 64 y.o.with PMH of schizoaffective disorder with paranoia, HTN, HLD, DM, Depressionadmitted for acute kidney injury, improved nowandSchizoaffective disorder.  Principal Problem:   Paranoid schizophrenia, chronic condition with acute exacerbation (HCC) Active Problems:   AKI (acute kidney injury) (HCC)   DM (diabetes mellitus), type 2 (HCC)   HTN (hypertension), benign   HLD (hyperlipidemia)   Acute kidney injury (HCC)   Hallucinations   Delirium  #Schizoaffective disorder Patient's psychosis appears worsened today. Contacted psychiatry team for additional recommendations.   - Appreciate psychiatry's recommendations  - Awaiting geriatric psychiatry placement  - Zoloft 100mg  daily - Continue Trazodone 300mg  qHS -Zyprexa 7.5 mgQHS - Melatonin 3 mg QHS - Ativan 0.25 mg PO or IM QHS  # Drug-induced extrapyramidal movement disorder - C/w Amantadine 100mg  BID - C/w Bentropine 1mg  AM & 0.5mg  PM  # Acute Kidney Injury, resolved # Chronic Kidney Disease 3a - Avoid nephrotoxic agents  - Encourage PO intake - Regular diet - Ensure supplement  Prior to Admission Living Arrangement:Home Anticipated Discharge Location:Inpatient psychiatry Barriers to Discharge:Placement   Dr. 64 Internal Medicine PGY-2   Pager: [phone] After 5pm on weekdays and 1pm on weekends: On Call pager [phone]  03/23/2020, 1:16 PM

## 2020-03-23 NOTE — TOC Transition Note (Signed)
Transition of Care [facility]) - CM/SW Discharge Note   Patient Details  Name: Alleigh Mollica MRN: [id number] Date of Birth: 16-Feb-1956  [facility] CM/SW Contact:  Cristobal Goldmann, LCSW Phone Number: 03/23/2020, 6:19 PM   Clinical Narrative:  To date a psychiatric facility has not been located. Paperwork will be completed for [facility] and patient will have to be IVC'd.            Patient Goals and CMS Choice        Discharge Placement - Psychiatric facility                       Discharge Plan and Services                                     Social Determinants of Health (SDOH) Interventions  Patient continues to need inpatient psych placement   Readmission Risk Interventions No flowsheet data found.

## 2020-03-24 DIAGNOSIS — F200 Paranoid schizophrenia: Secondary | ICD-10-CM | POA: Diagnosis not present

## 2020-03-24 LAB — GLUCOSE, CAPILLARY
Glucose-Capillary: 131 mg/dL — ABNORMAL HIGH (ref 70–99)
Glucose-Capillary: 160 mg/dL — ABNORMAL HIGH (ref 70–99)

## 2020-03-24 MED ORDER — LORAZEPAM 0.5 MG PO TABS
0.2500 mg | ORAL_TABLET | Freq: Every evening | ORAL | Status: DC | PRN
  Administered 2020-03-25 – 2020-04-01 (×4): 0.25 mg via ORAL
  Filled 2020-03-24 (×4): qty 1

## 2020-03-24 MED ORDER — TRAZODONE HCL 100 MG PO TABS
200.0000 mg | ORAL_TABLET | Freq: Every day | ORAL | Status: DC
  Administered 2020-03-24 – 2020-03-26 (×3): 200 mg via ORAL
  Filled 2020-03-24 (×3): qty 2

## 2020-03-24 MED ORDER — LORAZEPAM 2 MG/ML IJ SOLN: 0.2500 mg | Freq: Every evening | INTRAMUSCULAR | Status: DC | PRN

## 2020-03-24 MED ORDER — OLANZAPINE 5 MG PO TABS
10.0000 mg | ORAL_TABLET | Freq: Every day | ORAL | Status: DC
  Administered 2020-03-24 – 2020-04-05 (×13): 10 mg via ORAL
  Filled 2020-03-24 (×13): qty 2

## 2020-03-24 MED ORDER — BENZTROPINE MESYLATE 0.5 MG PO TABS
0.5000 mg | ORAL_TABLET | Freq: Two times a day (BID) | ORAL | Status: DC
  Administered 2020-03-24 – 2020-03-27 (×6): 0.5 mg via ORAL
  Filled 2020-03-24 (×7): qty 1

## 2020-03-24 NOTE — Progress Notes (Signed)
    Subjective: HD#25 No acute overnight events. [name] evaluated at bedside this morning. She states she is not hearing any voices right now as she was knocked out whole night. Denies suicidal or homicidal ideations.  Objective:  Vital signs in last 24 hours: Vitals:   03/23/20 1757 03/23/20 2107 03/23/20 2145 03/24/20 0508  BP: 101/60 (!) 112/51 (!) 112/51 (!) 110/49  Pulse: 63 (!) 55 (!) 55 (!) 56  Resp: 18  17 16   Temp: 98 F (36.7 C) 99 F (37.2 C) 99 F (37.2 C) 98.3 F (36.8 C)  TempSrc:   Oral   SpO2: 98% 95% 95% 97%  Weight:       Physical exam: General :Well developed, well nourished, middle aged female laying comfortably in bed, NAD. Neuro: AAO*3 Psych: Normal mood and affect, unable to access for auditory or visual hallucinations.  Assessment/Plan: Jessica Robinsonis a 64 y.o.with PMH of schizoaffective disorder with paranoia, HTN, HLD, DM, Depressionadmitted for acute kidney injury, improved nowandSchizoaffective disorder.  Principal Problem:   Paranoid schizophrenia, chronic condition with acute exacerbation (HCC) Active Problems:   AKI (acute kidney injury) (HCC)   DM (diabetes mellitus), type 2 (HCC)   HTN (hypertension), benign   HLD (hyperlipidemia)   Acute kidney injury (HCC)   Hallucinations   Delirium  #Schizoaffective disorder Unable to access AVH this morning as patient awaked and provided with breakfast. Denies suicidal or homicidal ideations. Psych was consulted yesterday and waiting on their recommendations.   - Appreciate psychiatry's recommendations  - Awaiting geriatric psychiatry placement  - Zoloft 100mg  daily - Continue Trazodone 300mg  qHS -Zyprexa 7.5 mgQHS - Melatonin 3 mg QHS - Ativan 0.25 mg PO or IM QHS  # Drug-induced extrapyramidal movement disorder  - C/w Amantadine 100mg  BID - C/w Bentropine 1mg  AM & 0.5mg  PM  # Acute Kidney Injury, resolved # Chronic Kidney Disease 3a - Avoid nephrotoxic agents  -  Encourage PO intake - Regular diet - Ensure supplement  Prior to Admission Living Arrangement:Home Anticipated Discharge Location:Inpatient psychiatry Barriers to Discharge:Placement   Dr. 64 Brandt Chaney Internal Medicine PGY-1 Pager: [phone] After 5pm on weekdays and 1pm on weekends: On Call pager [phone]  03/24/2020, 8:31 AM

## 2020-03-24 NOTE — Plan of Care (Signed)
  Problem: Education: Goal: Knowledge of General Education information will improve Description Including pain rating scale, medication(s)/side effects and non-pharmacologic comfort measures Outcome: Progressing   

## 2020-03-24 NOTE — Consult Note (Signed)
[facility] Psychiatry Consult   Reason for Consult: Schizoaffective disorder  Referring Physician:  Dr. Philipp Ovens Patient Identification: Jessica Campbell MRN:  [id number] Principal Diagnosis: Paranoid schizophrenia, chronic condition with acute exacerbation ([place]) Diagnosis:  Principal Problem:   Paranoid schizophrenia, chronic condition with acute exacerbation ([place]) Active Problems:   AKI (acute kidney injury) ([place])   DM (diabetes mellitus), type 2 ([place])   HTN (hypertension), benign   HLD (hyperlipidemia)   Acute kidney injury ([place])   Hallucinations   Delirium   Total Time spent : 1.5 hours  Subjective:   Jessica Campbell is a 64 y.o. female patient who presented with worsening psychosis and was found to have an acute kidney injury upon laboratory evaluation.   HPI:  Patient has been seen by psychiatry primary team. Please see initial note for more detailed history of presenting illness. Basically patient was presenting with worsening of her psychosis and inability to maintain ADLs which led to her admission. Patient was found to have a Acute Kidney injury which is now resolved according to primary team.   Patent has displayed psychotic symptoms while here in the hospital including auditory and visual hallucinations. Her medications were adjusted by the primary psych consult team and the patient has been cross-tapered from Aripiprazole to Olanzapine and is currently receiving 7.17m QHS. In addition to this,  patient is receiving Cogentin, Amantadine, Sertraline and Trazodone as well as Ativan.    Upon exam today, patient is pleasant. She is not overly paranoid but eagerly tells wProbation officerof a man standing over her bed last night and speaking to her in an unknown language. Patient states that this is distressing to her, but despite this is not feeling any desire to harm herself or others. A brief cognitive exam shows patient with poor memory, poor cognition and is still not fully  oriented. Patient is unable to tell me where she is or why she is here. She is unable to state the date or month. She is unable to tell me the president.    Patient's relative, Tonya, was at bedside and able to provide some collateral information in regards to the patient's progress. TKenney Housemanreports that the patient's condition upon admission was very worrisome as the patient was unable to take care of herself and there were many concerns of her ability to function in the home with only the assistance of her sister. TKenney Housemanreports that since that time, the patient's condition has improved but is not yet back to baseline. The initial recommendations of inpatient psychiatry placement were discussed with sister who is agreeable and feels as if that would still be the best course of action at this point. It was also discussed with TKenney Housemanthat due to the patient's improving symptoms, there may be a point in the coming days where she no longer meets criteria for inpatient psychiatric care and a family decision would likely have to be made about having the patient return home vs nursing home placement.    Past Psychiatric History: Notable for schizoaffective disorder and prior medication trials including Risperidone, Paliperidone, Quetiapine. Patient has been inpatient before, most recently this past year. She is unable to provide further history of her illness at this time.  Risk to Self:  yes Risk to Others:  no Prior Inpatient Therapy:  yes Prior Outpatient Therapy:   no  Past Medical History:  Past Medical History:  Diagnosis Date   Borderline diabetes    Depression    Diabetes mellitus without complication ([place]  High cholesterol    Hypertension    Schizoaffective disorder ([place])    Scoliosis     Past Surgical History:  Procedure Laterality Date   ABDOMINAL HYSTERECTOMY     Family History: No family history on file.  Social History:  Social History   Substance and Sexual Activity   Alcohol Use No     Social History   Substance and Sexual Activity  Drug Use No    Social History   Socioeconomic History   Marital status: Single    Spouse name: Not on file   Number of children: Not on file   Years of education: Not on file   Highest education level: Not on file  Occupational History   Not on file  Tobacco Use   Smoking status: Never Smoker   Smokeless tobacco: Never Used  Substance and Sexual Activity   Alcohol use: No   Drug use: No   Sexual activity: Not on file  Other Topics Concern   Not on file  Social History Narrative   Not on file   Social Determinants of Health   Financial Resource Strain: Not on file  Food Insecurity: Not on file  Transportation Needs: Not on file  Physical Activity: Not on file  Stress: Not on file  Social Connections: Not on file   Additional Social History:    Allergies:   Allergies  Allergen Reactions   Paliperidone     Other reaction(s): Hives / Skin Rash   Quetiapine Fumarate Rash    Other reaction(s): Hives / Skin Rash   Risperidone Rash    Other reaction(s): Hives / Skin Rash   Diphenhydramine Hcl     Other reaction(s): Difficulty breathing, Hives / Skin Rash   Latex Rash    Other reaction(s): Rash, Rash Other reaction(s): DERMATITIS Other reaction(s): DERMATITIS     Labs:  Results for orders placed or performed during the hospital encounter of 02/27/20 (from the past 48 hour(s))  Glucose, capillary     Status: Abnormal   Collection Time: 03/23/20  6:40 AM  Result Value Ref Range   Glucose-Capillary 167 (H) 70 - 99 mg/dL    Comment: Glucose reference range applies only to samples taken after fasting for at least 8 hours.  Glucose, capillary     Status: Abnormal   Collection Time: 03/23/20  8:31 PM  Result Value Ref Range   Glucose-Capillary 141 (H) 70 - 99 mg/dL    Comment: Glucose reference range applies only to samples taken after fasting for at least 8 hours.  Glucose,  capillary     Status: Abnormal   Collection Time: 03/24/20  6:40 AM  Result Value Ref Range   Glucose-Capillary 131 (H) 70 - 99 mg/dL    Comment: Glucose reference range applies only to samples taken after fasting for at least 8 hours.    Current Facility-Administered Medications  Medication Dose Route Frequency Provider Last Rate Last Admin   acetaminophen (TYLENOL) tablet 650 mg  650 mg Oral Q6H PRN Norins, Heinz Knuckles, MD   650 mg at 03/03/20 2156   Or   acetaminophen (TYLENOL) suppository 650 mg  650 mg Rectal Q6H PRN Norins, Heinz Knuckles, MD       amantadine (SYMMETREL) capsule 100 mg  100 mg Oral BID Mosetta Anis, MD   100 mg at 03/24/20 1035   aspirin EC tablet 81 mg  81 mg Oral Daily Mosetta Anis, MD   81 mg at 03/24/20 1034  atorvastatin (LIPITOR) tablet 40 mg  40 mg Oral Daily Norins, Heinz Knuckles, MD   40 mg at 03/24/20 1034   benztropine (COGENTIN) tablet 1 mg  1 mg Oral Daily Angelica Pou, MD   1 mg at 03/24/20 1036   And   benztropine (COGENTIN) tablet 0.5 mg  0.5 mg Oral QHS Angelica Pou, MD   0.5 mg at 03/23/20 2131   feeding supplement (ENSURE ENLIVE / ENSURE PLUS) liquid 237 mL  237 mL Oral TID BM Velna Ochs, MD   237 mL at 03/24/20 1453   heparin injection 5,000 Units  5,000 Units Subcutaneous Q8H Mosetta Anis, MD   5,000 Units at 03/24/20 1454   insulin aspart (novoLOG) injection 0-15 Units  0-15 Units Subcutaneous TID WC Norins, Heinz Knuckles, MD   3 Units at 03/24/20 1230   lisinopril (ZESTRIL) tablet 20 mg  20 mg Oral Daily Dagar, Meredith Staggers, MD   20 mg at 03/24/20 1034   LORazepam (ATIVAN) tablet 0.25 mg  0.25 mg Oral QHS Jose Persia, MD   0.25 mg at 03/23/20 2132   Or   LORazepam (ATIVAN) injection 0.25 mg  0.25 mg Intramuscular QHS Jose Persia, MD       melatonin tablet 3 mg  3 mg Oral QHS Dagar, Anjali, MD   3 mg at 03/23/20 2131   multivitamin with minerals tablet 1 tablet  1 tablet Oral Daily Velna Ochs, MD   1 tablet at  03/24/20 1034   OLANZapine (ZYPREXA) tablet 7.5 mg  7.5 mg Oral QHS Suella Broad, FNP   7.5 mg at 03/23/20 2130   pantoprazole (PROTONIX) EC tablet 40 mg  40 mg Oral Daily Mosetta Anis, MD   40 mg at 03/24/20 1035   polyethylene glycol (MIRALAX / GLYCOLAX) packet 17 g  17 g Oral BID Jose Persia, MD   17 g at 03/24/20 1035   senna (SENOKOT) tablet 17.2 mg  2 tablet Oral BID Jose Persia, MD   17.2 mg at 03/24/20 1035   sertraline (ZOLOFT) tablet 100 mg  100 mg Oral Daily Suella Broad, FNP   100 mg at 03/24/20 1034   traZODone (DESYREL) tablet 300 mg  300 mg Oral QHS Velna Ochs, MD   300 mg at 03/23/20 2128      Psychiatric Specialty Exam: Physical Exam  Review of Systems  Blood pressure (!) 103/48, pulse (!) 57, temperature 98.4 F (36.9 C), resp. rate 18, weight 65 kg, SpO2 100 %.Body mass index is 26.21 kg/m.  General Appearance: Fairly Groomed  Eye Contact:  Good  Speech:  Clear and Coherent  Volume:  Normal  Mood:  Euthymic  Affect:  Appropriate  Thought Process:  Linear  Orientation:  Other:  Oriented to person, not place or time  Thought Content:  Hallucinations: Auditory Visual and Paranoid Ideation  Suicidal Thoughts:  No  Homicidal Thoughts:  No  Memory:  Recent;   Poor Remote;   Poor  Judgement:  Impaired  Insight:  Lacking  Psychomotor Activity:  Mannerisms  Concentration:  Concentration: Poor  Recall:  Poor  Fund of Knowledge:  Poor  Language:  Fair  Akathisia:  No  Handed:  Right  AIMS (if indicated):     Assets:  Social Support  ADL's:  Impaired  Cognition:  Impaired,  Moderate  Sleep:      Treatment Plan Summary:  64 year old woman with history of schizoaffective illness currently hospitalized after acute kidney injury, continuing to  present with AMS, psychosis. At the current time, the patient is experiencing psychotic symptoms which put her at danger to herself and further hinder her previously poor ability to  complete ADLs. Due to this, I would agree that the patient meets criteria for geriatric inpatient psychiatry services. Her condition is improving, however, and it is possible that patient may no longer meet criteria in the coming days.  The exact cause of her decompensation is not known; looking at the history shows poor psychiatric follow-up and possible missed doses of IM Long Acting Injectable antipsychotics. Dehydration and the recent kidney injury also very likely played a role in the patient's AMS and is known to have prolonged course of delirium even after Creatinine values return to normal, especially in the geriatric psychiatric population.  The patient's overall status is on the decline as her cognition is showing typical age related changes of patient with schizophrenia which compromise a dementia like picture. Therefore her safety in the home will continue to be question as this patient continues to age, especially as this patient has multiple co-morbidities and risk factors including insulin-dependent diabetes. Further conversation should be had with family to include long term goals of care, possible future nursing facility placement as well as Healthcare proxy status.  Currently patient is on a high dose of Trazodone, and a moderate dose of sertraline and a moderate dose of Zyprexa, all of which are serotonergic and can possibly prolong QTC . Her Zyprexa dose was recently added, and appears to be helping patient stabilize, however she continues to have psychotic symptoms. Zyprexa dose should be raised, and at the same time her trazodone dose should be decreased. The QHS Ativan should be used sparingly and only when absolutely necessary as this has shown to worsen confusion in patients with delirium/dementia. Further labs should be ordered to rule out other possible causes of AMS/ worsening psychosis. Patient should have an EKG performed and psych medications held if QTC is significantly  elevated.    Consider the following recommendations:  Obtain EKG- hold trazodone and zyprexa if QTC significantly elevated Obtain B12 level, RPR, TSH, ESR, CRP  Increase QHS Zyprexa to 17m Decrease QHS trazodone to 2026mChange QHS ativan to be administered on a PRN basis Continue with QHS melatonin Continue Benztropine but switch administration to 0.49m4mID or 1mg30mS Continue with Sertraline 100mg28mtinue Amantadine  Family conversation about suspected course/ timeline Re-eval in 2 days time for continued need for inpatient bed.    Disposition: Recommend psychiatric Inpatient admission when medically cleared.  Ranee Peasley Dixie Dials12/18/2021 3:39 PM

## 2020-03-25 DIAGNOSIS — N179 Acute kidney failure, unspecified: Secondary | ICD-10-CM | POA: Diagnosis not present

## 2020-03-25 DIAGNOSIS — F200 Paranoid schizophrenia: Secondary | ICD-10-CM | POA: Diagnosis not present

## 2020-03-25 DIAGNOSIS — G2570 Drug induced movement disorder, unspecified: Secondary | ICD-10-CM | POA: Diagnosis not present

## 2020-03-25 LAB — BASIC METABOLIC PANEL
Anion gap: 10 (ref 5–15)
BUN: 28 mg/dL — ABNORMAL HIGH (ref 8–23)
CO2: 24 mmol/L (ref 22–32)
Calcium: 9.5 mg/dL (ref 8.9–10.3)
Chloride: 105 mmol/L (ref 98–111)
Creatinine, Ser: 1.48 mg/dL — ABNORMAL HIGH (ref 0.44–1.00)
GFR, Estimated: 40 mL/min — ABNORMAL LOW (ref >60)
Glucose, Bld: 243 mg/dL — ABNORMAL HIGH (ref 70–99)
Potassium: 4.3 mmol/L (ref 3.5–5.1)
Sodium: 139 mmol/L (ref 135–145)

## 2020-03-25 LAB — GLUCOSE, CAPILLARY
Glucose-Capillary: 115 mg/dL — ABNORMAL HIGH (ref 70–99)
Glucose-Capillary: 259 mg/dL — ABNORMAL HIGH (ref 70–99)

## 2020-03-25 NOTE — Progress Notes (Signed)
    Subjective: HD#26 No acute overnight events.  [name] evaluated at bedside this AM. She states she feels good this morning. Denies any suicidal or homicidal ideations.    Objective:  Vital signs in last 24 hours: Vitals:   03/24/20 1648 03/24/20 2036 03/25/20 0608 03/25/20 0916  BP: 91/67 (!) 154/98 140/88 (!) 88/32  Pulse: 60 (!) 102 100 67  Resp: 18 18 18 18   Temp: 98.8 F (37.1 C) 98.8 F (37.1 C) 98 F (36.7 C) 98.1 F (36.7 C)  TempSrc:  Oral Oral   SpO2: 96% 95% 98% 92%  Weight:       BMP Latest Ref Rng & Units 03/25/2020 03/14/2020 03/11/2020  Glucose 70 - 99 mg/dL 14/08/2019) 244(W) 102(V)  BUN 8 - 23 mg/dL 253(G) 23 21  Creatinine 0.44 - 1.00 mg/dL 64(Q) 0.34(V) 4.25(Z)  Sodium 135 - 145 mmol/L 139 141 138  Potassium 3.5 - 5.1 mmol/L 4.3 4.1 4.3  Chloride 98 - 111 mmol/L 105 105 103  CO2 22 - 32 mmol/L 24 26 25   Calcium 8.9 - 10.3 mg/dL 9.5 5.63(O 9.9    Physical exam: General :Well developed, well nourished, middle aged female laying comfortably in bed, NAD. Neuro: AAO*3 Psych: Normal mood and affect, clearly responding to internal stimuli.  Assessment/Plan: Jessica Robinsonis a 64 y.o.with PMH of schizoaffective disorder with paranoia, HTN, HLD, DM, Depressionadmitted for acute kidney injury, improved nowandSchizoaffective disorder.  Principal Problem:   Paranoid schizophrenia, chronic condition with acute exacerbation (HCC) Active Problems:   AKI (acute kidney injury) (HCC)   DM (diabetes mellitus), type 2 (HCC)   HTN (hypertension), benign   HLD (hyperlipidemia)   Acute kidney injury (HCC)   Hallucinations   Delirium  #Schizoaffective disorder Patient is clearly responding to internal stimuli. Denies suicidal or homicidal ideations. Psych was consulted yesterday and appreciate their recommendations.    - Appreciate psychiatry's recommendations  - Awaiting geriatric psychiatry placement  - Zoloft 100mg  daily - Change Trazodone to 200mg   qHS -Increase Zyprexa to 10 mgQHS - Melatonin 3 mg QHS - Ativan 0.25 mg PO or IM QHS PRN  # Drug-induced extrapyramidal movement disorder  - C/w Amantadine 100mg  BID - Change to Benztropine 0.5 mg BID from  C/w Bentropine 1mg  AM & 0.5mg  PM  # Acute Kidney Injury, sCr~1.48, may be her new baseline. # Chronic Kidney Disease 3a - Avoid nephrotoxic agents  - Encourage PO intake - Regular diet - Ensure supplement  Prior to Admission Living Arrangement:Home Anticipated Discharge Location:Inpatient psychiatry Barriers to Discharge:Placement   Dr. 75.6 Lucas Winograd Pager: [phone] After 5pm on weekdays and 1pm on weekends: On Call pager [phone]  03/25/2020, 4:16 PM

## 2020-03-26 DIAGNOSIS — D649 Anemia, unspecified: Secondary | ICD-10-CM

## 2020-03-26 DIAGNOSIS — G2401 Drug induced subacute dyskinesia: Secondary | ICD-10-CM

## 2020-03-26 DIAGNOSIS — I129 Hypertensive chronic kidney disease with stage 1 through stage 4 chronic kidney disease, or unspecified chronic kidney disease: Secondary | ICD-10-CM | POA: Diagnosis not present

## 2020-03-26 DIAGNOSIS — T443X5A Adverse effect of other parasympatholytics [anticholinergics and antimuscarinics] and spasmolytics, initial encounter: Secondary | ICD-10-CM | POA: Diagnosis not present

## 2020-03-26 DIAGNOSIS — F200 Paranoid schizophrenia: Secondary | ICD-10-CM | POA: Diagnosis not present

## 2020-03-26 DIAGNOSIS — N1832 Chronic kidney disease, stage 3b: Secondary | ICD-10-CM

## 2020-03-26 DIAGNOSIS — D631 Anemia in chronic kidney disease: Secondary | ICD-10-CM | POA: Diagnosis not present

## 2020-03-26 LAB — GLUCOSE, CAPILLARY
Glucose-Capillary: 135 mg/dL — ABNORMAL HIGH (ref 70–99)
Glucose-Capillary: 166 mg/dL — ABNORMAL HIGH (ref 70–99)
Glucose-Capillary: 101 mg/dL — ABNORMAL HIGH (ref 70–99)

## 2020-03-26 MED ORDER — AMANTADINE HCL 50 MG/5ML PO SOLN
100.0000 mg | Freq: Two times a day (BID) | ORAL | Status: DC
  Administered 2020-03-26 – 2020-04-07 (×24): 100 mg via ORAL
  Filled 2020-03-26 (×25): qty 10

## 2020-03-26 NOTE — Progress Notes (Addendum)
Internal Medicine Attending Note:  Patient sitting in chair this morning. Answers some yes no questions but responding to internal stimuli. Difficult to hold her attention.   Blood pressure 101/81, pulse (!) 56, temperature 98.6 F (37 C), temperature source Oral, resp. rate 18, weight 65 kg, SpO2 98 %.  Physical Exam Constitutional: NAD, Sitting in chair although slumped to one side   Pulmonary/Chest: Normal effort, breathing comfortably on RA Extremities: Bilateral upper extremity tremor   Psychiatric: Difficulty holding attention, appears to be responding to internal stimuli, answer some yes no questions.   Patient is a 64 yo F [facility] patient with a pmhx of HTN, HLD, Type II DM, paranoid schizophrenia, and medication induced tardive dyskinesia here with worsening psychosis after changes to her outpatient antipsychotic regimen.  1. Paranoid Schizophrenia / Schizoaffective disorder: Currently waiting for inpatient psych placement. Appreciate Psychiatry consult yesterday. I do not think extensive medical work up is warranted as we have a clear reason for her decompensation as outlined in Dr. Mayford Knife' note from yesterday (changes to outpatient medications). She is on multiple QT prolonging medications so will obtain EKG for monitoring. She does have a chronic normocytic anemia so it may be reasonable to check Vitamin B12 along with iron studies. We can repeat CBC and add these studies for tomorrow. Otherwise from a medical standpoint I do not think there are other factors contributing. I do worry about a component of hospital delirium with intermittent disorientation and worsening agitation at night.   2. Tardive Dyskinesia:  Continue Benztropine and amantadine  3.  Chronic Normocytic Anemia: Repeat CBC tomorrow, will also check iron studies and Vit B12 level   4. Type II DM: SSI-moderate TID AC  5. CKD IIIb: Avoid nephrotoxic meds   Reymundo Poll, MD 03/26/2020, 1:05 PM

## 2020-03-26 NOTE — Plan of Care (Signed)
  Problem: Education: Goal: Knowledge of General Education information will improve Description Including pain rating scale, medication(s)/side effects and non-pharmacologic comfort measures Outcome: Progressing   

## 2020-03-26 NOTE — Progress Notes (Signed)
Physical Therapy Treatment Patient Details Name: Jessica Campbell MRN: [id number] DOB: 23-Mar-1956 Today's Date: 03/26/2020    History of Present Illness Jessica Campbell is a 64 y.o. with PMH of schizoaffective disorder with paranoia, HTN, HLD, DM, depression admitted for hallucinations and durg induced extrapyramidal movement disorder.    PT Comments    Pt was assisted to side of bed today and demonstrated a better controlled effort to move.  Her limits of central mm tone and awareness with her hallucinations interfering are making her struggle to get focused mobility accomplished.  Follow up with goals of acute therapy and work on her resumption of gait as pt is able to get meds and her attentional focus to allow.   Follow Up Recommendations  Other (comment) (geripsych unit)     Equipment Recommendations  Rolling walker with 5" wheels    Recommendations for Other Services       Precautions / Restrictions Precautions Precautions: Fall Precaution Comments: visual and auditory hallucinations, multidirectional and unpredictable balance loss (strong extension tone) Restrictions Weight Bearing Restrictions: No    Mobility  Bed Mobility Overal bed mobility: Needs Assistance Bed Mobility: Supine to Sit;Sit to Supine     Supine to sit: Mod assist Sit to supine: Mod assist   General bed mobility comments: sat up on side of bed with mod assist as pt is at most times pulling into extension wiht her trunk  Transfers Overall transfer level: Needs assistance Equipment used: 1 person hand held assist Transfers: Sit to/from Stand Sit to Stand: Mod assist         General transfer comment: mod to get her to lean forward and then overcome extension tone in her list  Ambulation/Gait             General Gait Details: unable to keep pt up safely to attempt steps   Stairs             Wheelchair Mobility    Modified Rankin (Stroke Patients Only)       Balance  Overall balance assessment: Needs assistance Sitting-balance support: Feet supported;Bilateral upper extremity supported Sitting balance-Leahy Scale: Fair Sitting balance - Comments: fair once set Postural control: Posterior lean Standing balance support: Bilateral upper extremity supported;During functional activity Standing balance-Leahy Scale: Poor Standing balance comment: leanign backward and requiring cued assistance                            Cognition Arousal/Alertness: Awake/alert Behavior During Therapy: Impulsive;Restless Overall Cognitive Status: History of cognitive impairments - at baseline Area of Impairment: Problem solving;Awareness;Safety/judgement;Following commands;Memory;Attention;Orientation                 Orientation Level: Situation;Time Current Attention Level: Selective Memory: Decreased recall of precautions;Decreased short-term memory Following Commands: Follows one step commands inconsistently;Follows one step commands with increased time Safety/Judgement: Decreased awareness of safety;Decreased awareness of deficits Awareness: Intellectual Problem Solving: Slow processing;Requires verbal cues;Requires tactile cues General Comments: pt is in some agreement with PT to try movement but her older sister is in attendance of the session and is a very stabilizing influence      Exercises General Exercises - Lower Extremity Ankle Circles/Pumps: PROM;5 reps Heel Slides: PROM;AAROM;10 reps Hip ABduction/ADduction: AAROM;PROM;10 reps Straight Leg Raises: PROM;AAROM;10 reps Hip Flexion/Marching: AAROM;PROM;10 reps    General Comments General comments (skin integrity, edema, etc.): Pt is assisting to get up then shifting direction of pull with mm's.  cues and extra time needed  to get to stand      Pertinent Vitals/Pain Pain Assessment: No/denies pain    Home Living                      Prior Function            PT Goals  (current goals can now be found in the care plan section) Acute Rehab PT Goals Patient Stated Goal: none stated Progress towards PT goals: Progressing toward goals    Frequency    Min 2X/week      PT Plan Current plan remains appropriate    Co-evaluation              AM-PAC PT "6 Clicks" Mobility   Outcome Measure  Help needed turning from your back to your side while in a flat bed without using bedrails?: A Lot Help needed moving from lying on your back to sitting on the side of a flat bed without using bedrails?: A Lot Help needed moving to and from a bed to a chair (including a wheelchair)?: A Lot Help needed standing up from a chair using your arms (e.g., wheelchair or bedside chair)?: A Lot Help needed to walk in hospital room?: Total Help needed climbing 3-5 steps with a railing? : Total 6 Click Score: 10    End of Session Equipment Utilized During Treatment: Gait belt Activity Tolerance: Patient tolerated treatment well Patient left: in bed;with call bell/phone within reach;with bed alarm set;with family/visitor present Nurse Communication: Mobility status PT Visit Diagnosis: Unsteadiness on feet (R26.81);History of falling (Z91.81);Other abnormalities of gait and mobility (R26.89)     Time: 1950-9326 PT Time Calculation (min) (ACUTE ONLY): 32 min  Charges:  $Therapeutic Exercise: 8-22 mins $Therapeutic Activity: 8-22 mins                  Ivar Drape 03/26/2020, 4:56 PM  Samul Dada, PT MS Acute Rehab Dept. Number: [facility] [id number] and [facility] [phone]

## 2020-03-27 LAB — CBC
HCT: 32.8 % — ABNORMAL LOW (ref 36.0–46.0)
Hemoglobin: 10.2 g/dL — ABNORMAL LOW (ref 12.0–15.0)
MCH: 27.9 pg (ref 26.0–34.0)
MCHC: 31.1 g/dL (ref 30.0–36.0)
MCV: 89.9 fL (ref 80.0–100.0)
Platelets: 254 10*3/uL (ref 150–400)
RBC: 3.65 MIL/uL — ABNORMAL LOW (ref 3.87–5.11)
RDW: 13.8 % (ref 11.5–15.5)
WBC: 8.7 10*3/uL (ref 4.0–10.5)
nRBC: 0 % (ref 0.0–0.2)

## 2020-03-27 LAB — VITAMIN B12: Vitamin B-12: 888 pg/mL (ref 180–914)

## 2020-03-27 LAB — IRON AND TIBC
Iron: 45 ug/dL (ref 28–170)
Saturation Ratios: 16 % (ref 10.4–31.8)
TIBC: 281 ug/dL (ref 250–450)
UIBC: 236 ug/dL

## 2020-03-27 LAB — GLUCOSE, CAPILLARY
Glucose-Capillary: 121 mg/dL — ABNORMAL HIGH (ref 70–99)
Glucose-Capillary: 172 mg/dL — ABNORMAL HIGH (ref 70–99)
Glucose-Capillary: 134 mg/dL — ABNORMAL HIGH (ref 70–99)
Glucose-Capillary: 106 mg/dL — ABNORMAL HIGH (ref 70–99)

## 2020-03-27 LAB — FERRITIN: Ferritin: 255 ng/mL (ref 11–307)

## 2020-03-27 MED ORDER — BENZTROPINE MESYLATE 1 MG PO TABS
1.0000 mg | ORAL_TABLET | Freq: Every day | ORAL | Status: DC
  Administered 2020-03-28 – 2020-04-05 (×9): 1 mg via ORAL
  Filled 2020-03-27 (×10): qty 1

## 2020-03-27 MED ORDER — OLANZAPINE 5 MG PO TABS
2.5000 mg | ORAL_TABLET | Freq: Every day | ORAL | Status: DC
  Administered 2020-03-27 – 2020-04-06 (×10): 2.5 mg via ORAL
  Filled 2020-03-27 (×12): qty 1

## 2020-03-27 MED ORDER — TRAZODONE HCL 100 MG PO TABS
100.0000 mg | ORAL_TABLET | Freq: Every day | ORAL | Status: DC
  Administered 2020-03-27 – 2020-03-28 (×2): 100 mg via ORAL
  Filled 2020-03-27 (×2): qty 1

## 2020-03-27 NOTE — Progress Notes (Signed)
Nutrition Follow-up  DOCUMENTATION CODES:   Not applicable  INTERVENTION:    Ensure Enlive/Plus po TID, each supplement provides 350 kcal and 20 grams of protein (Ensure Plus only has 13 grams of protein per serving)  MVI daily  Magic cup TID with meals, each supplement provides 290 kcal and 9 grams of protein  NUTRITION DIAGNOSIS:   Inadequate oral intake related to lethargy/confusion,other (see comment) (limited food acceptance) as evidenced by meal completion < 50%.  GOAL:   Patient will meet greater than or equal to 90% of their needs  MONITOR:   PO intake,Supplement acceptance,Labs,Weight trends,I & O's  REASON FOR ASSESSMENT:   LOS   ASSESSMENT:   Pt admitted with AKI (now resolved) and schizoaffective disorder. PMH includes schizoaffective disorder with paranoia, HTN, HLD, DM, and depression.  Patient continues to await placement in an inpatient geriatric psychiatric facility.   Patient was speaking with someone at her bedside today, so RD was unable to speak with patient today or complete nutrition focused physical exam.   Meal intakes recorded at 25-50%. Per review of MAR, patient is receiving and drinking Ensure Enlive/Plus TID. She is receiving MVI daily and magic cups TID with meals.  Labs reviewed. CBG: 121-172 Medications reviewed and include novolog SSI, miralax, senokot, MVI with minerals.  Weight recorded at 72.6 kg on 12/20, up from 68.3 kg on admission.  NUTRITION - FOCUSED PHYSICAL EXAM:  Unable to perform at this time. Will attempt at follow-up.   Diet Order:   Diet Order            Diet regular Room service appropriate? Yes; Fluid consistency: Thin  Diet effective now                 EDUCATION NEEDS:   Not appropriate for education at this time  Skin:  Skin Assessment: Reviewed RN Assessment  Last BM:  12/20  Height:   Ht Readings from Last 1 Encounters:  02/09/18 5\' 2"  (1.575 m)    Weight:   Wt Readings from Last 1  Encounters:  03/26/20 72.6 kg    BMI:  Body mass index is 29.26 kg/m.  Estimated Nutritional Needs:   Kcal:  1700-1900  Protein:  85-95 grams  Fluid:  >/=1.7L/d   03/28/20, RD, LDN, CNSC Please refer to [facility] for contact information.

## 2020-03-27 NOTE — Progress Notes (Signed)
Occupational Therapy Treatment Patient Details Name: Jessica Campbell MRN: [id number] DOB: [birth year] Today's Date: 03/27/2020    History of present illness Julianah Horsman is a 64 y.o. with PMH of schizoaffective disorder with paranoia, HTN, HLD, DM, depression admitted for hallucinations and durg induced extrapyramidal movement disorder.   OT comments  Pt making gradual progress towards OT goals this session. Pt continues to present with impaired sitting/ standing balance, impaired proprioception, decreased righting reactions and baseline cognitive impairments. Pt able to sit<>stand x2 from EOB with RW with pt needing up to MAX A to power into standing d/t strong posterior lean in standing with LOB in all directions upon standing. Pt attempting to stand with feet off the ground with narrow base of support no awareness to impaired proprioception. Pt following commands ~ 75 % of session. Pt would continue to benefit from skilled occupational therapy while admitted and after d/c to address the below listed limitations in order to improve overall functional mobility and facilitate independence with BADL participation. DC plan remains appropriate, will follow acutely per POC.     Follow Up Recommendations  SNF    Equipment Recommendations  3 in 1 bedside commode;Other (comment) (RW)    Recommendations for Other Services      Precautions / Restrictions Precautions Precautions: Fall Precaution Comments: visual and auditory hallucinations, multidirectional and unpredictable balance loss Restrictions Weight Bearing Restrictions: No       Mobility Bed Mobility Overal bed mobility: Needs Assistance Bed Mobility: Supine to Sit;Sit to Supine     Supine to sit: Mod assist;HOB elevated Sit to supine: Max assist;HOB elevated   General bed mobility comments: MOD A to transition from supine >sitting; MAX A to return to supine as pt resistant to movement  Transfers Overall transfer level:  Needs assistance Equipment used: 1 person hand held assist Transfers: Sit to/from Stand Sit to Stand: Mod assist;Max assist         General transfer comment: pt sit<>stand x2 from EOB with MOD- MAX A with RW, heavy posterior lean in standing needing use of gait belt and RW to cue pt to shift weight anteriorly in standing. pt with no righting reactions or proprioceptive awareness as pt noted to  stand with narrow BOS and attempt to stand up with feet off ground with pt attempting to lift feet off ground in standing, no awareness to LOB in all directions    Balance Overall balance assessment: Needs assistance   Sitting balance-Leahy Scale: Poor Sitting balance - Comments: up to MAX A at times d/t posterior lean with BUEs supported on EOB Postural control: Posterior lean Standing balance support: Bilateral upper extremity supported Standing balance-Leahy Scale: Poor Standing balance comment: leanign backward and requiring physical support                           ADL either performed or assessed with clinical judgement   ADL Overall ADL's : Needs assistance/impaired                           Toilet Transfer Details (indicate cue type and reason): unable to transfer d/t multi directional LOB upon standing x2 unable to progress mobility beyond sit<>stand         Functional mobility during ADLs: Moderate assistance;Maximal assistance;Rolling walker General ADL Comments: pt presents with imparied sitting and standing balance with heavy posterior lean from sitting and standing, pt following commands ~ 75% of  session but continues to be easily distracted by extraneous stimuli.     Vision       Perception     Praxis      Cognition Arousal/Alertness: Awake/alert Behavior During Therapy: Impulsive;Restless Overall Cognitive Status: History of cognitive impairments - at baseline Area of Impairment: Problem solving;Awareness;Safety/judgement;Following  commands;Attention;Orientation                 Orientation Level: Disoriented to;Person;Place;Time;Situation (unable to state name this session) Current Attention Level: Selective   Following Commands: Follows one step commands inconsistently;Follows multi-step commands inconsistently Safety/Judgement: Decreased awareness of safety;Decreased awareness of deficits Awareness: Intellectual Problem Solving: Slow processing;Requires verbal cues;Requires tactile cues;Difficulty sequencing General Comments: pt unable to state name this session, pt following commands ~ 75% of session, seemed to respond better to tactile cues this session and simple short commands. pts sister present during session, very helpful. pt did wave at end of session and state "bye" however continues to report seeing images that are not present        Exercises     Shoulder Instructions       General Comments pts sister present during session    Pertinent Vitals/ Pain       Pain Assessment: No/denies pain (was able to stand "np" after 3 attempts of asking)  Home Living                                          Prior Functioning/Environment              Frequency  Min 2X/week        Progress Toward Goals  OT Goals(current goals can now be found in the care plan section)  Progress towards OT goals: Progressing toward goals  Acute Rehab OT Goals Patient Stated Goal: none stated OT Goal Formulation: With patient/family Time For Goal Achievement: 04/03/20 Potential to Achieve Goals: Good  Plan Discharge plan remains appropriate;Frequency remains appropriate    Co-evaluation                 AM-PAC OT "6 Clicks" Daily Activity     Outcome Measure   Help from another person eating meals?: A Little Help from another person taking care of personal grooming?: A Lot Help from another person toileting, which includes using toliet, bedpan, or urinal?: Total Help from  another person bathing (including washing, rinsing, drying)?: Total Help from another person to put on and taking off regular upper body clothing?: A Lot Help from another person to put on and taking off regular lower body clothing?: Total 6 Click Score: 10    End of Session Equipment Utilized During Treatment: Gait belt;Rolling walker  OT Visit Diagnosis: Unsteadiness on feet (R26.81);History of falling (Z91.81);Other symptoms and signs involving cognitive function   Activity Tolerance Patient tolerated treatment well   Patient Left in bed;with call bell/phone within reach;with bed alarm set;with family/visitor present   Nurse Communication Mobility status        Time: 1610-9604 OT Time Calculation (min): 18 min  Charges: OT General Charges $OT Visit: 1 Visit OT Treatments $Therapeutic Activity: 8-22 mins  Audery Amel., COTA/L Acute Rehabilitation Services [phone]    Angelina Pih 03/27/2020, 4:21 PM

## 2020-03-27 NOTE — Consult Note (Signed)
[facility] Face-to-Face Psychiatry Consult   Reason for Consult: Schizoaffective disorder  Referring Physician:  Dr. Roxy Horseman Patient Identification: Jessica Campbell MRN:  [id number] Principal Diagnosis: Paranoid schizophrenia, chronic condition with acute exacerbation (HCC) Diagnosis:  Principal Problem:   Paranoid schizophrenia, chronic condition with acute exacerbation (HCC) Active Problems:   AKI (acute kidney injury) (HCC)   DM (diabetes mellitus), type 2 (HCC)   HTN (hypertension), benign   HLD (hyperlipidemia)   Acute kidney injury (HCC)   Hallucinations   Delirium   Anemia   Total Time spent : 35 minutes  Subjective:   Jessica Campbell is a 64 y.o. female patient who presented with worsening psychosis and was found to have an acute kidney injury upon laboratory evaluation.   HPI:   64 yo female with agitation on assessment, hallucinating.  One of her sister's was at her bedside and reports she was talking to their mother who is dead and seeing people.  Mumbling at time during the assessment.  Alert, not oriented.  Patient repositioned with her sister's assistance who she typically leaves with.  She reports she dressed herself and did her ADLs prior to admission to the hospital.  Her sister cooked for her and washed her clothes.  High level of functioning.  Zyprexa 10 mg stated a few days ago with positive effects until today.  She is restless and trying to get out of bed.  Reviewed medications and made adjustments to assist her mood and hallucinations.    Last psych assessment on 12/18: Patient has been seen by psychiatry primary team. Please see initial note for more detailed history of presenting illness. Basically patient was presenting with worsening of her psychosis and inability to maintain ADLs which led to her admission. Patient was found to have a Acute Kidney injury which is now resolved according to primary team.   Patent has displayed psychotic symptoms while here in  the hospital including auditory and visual hallucinations. Her medications were adjusted by the primary psych consult team and the patient has been cross-tapered from Aripiprazole to Olanzapine and is currently receiving 7.5mg  QHS. In addition to this,  patient is receiving Cogentin, Amantadine, Sertraline and Trazodone as well as Ativan.    Upon exam today, patient is pleasant. She is not overly paranoid but eagerly tells Clinical research associate of a man standing over her bed last night and speaking to her in an unknown language. Patient states that this is distressing to her, but despite this is not feeling any desire to harm herself or others. A brief cognitive exam shows patient with poor memory, poor cognition and is still not fully oriented. Patient is unable to tell me where she is or why she is here. She is unable to state the date or month. She is unable to tell me the president.    Patient's relative, Jessica Campbell, was at bedside and able to provide some collateral information in regards to the patient's progress. Jessica Campbell reports that the patient's condition upon admission was very worrisome as the patient was unable to take care of herself and there were many concerns of her ability to function in the home with only the assistance of her sister. Jessica Campbell reports that since that time, the patient's condition has improved but is not yet back to baseline. The initial recommendations of inpatient psychiatry placement were discussed with sister who is agreeable and feels as if that would still be the best course of action at this point. It was also discussed with [place] that  due to the patient's improving symptoms, there may be a point in the coming days where she no longer meets criteria for inpatient psychiatric care and a family decision would likely have to be made about having the patient return home vs nursing home placement.    Past Psychiatric History: Notable for schizoaffective disorder and prior medication trials  including Risperidone, Paliperidone, Quetiapine. Patient has been inpatient before, most recently this past year. She is unable to provide further history of her illness at this time.  Risk to Self:  yes Risk to Others:  no Prior Inpatient Therapy:  yes Prior Outpatient Therapy:   no  Past Medical History:  Past Medical History:  Diagnosis Date  . Borderline diabetes   . Depression   . Diabetes mellitus without complication (HCC)   . High cholesterol   . Hypertension   . Schizoaffective disorder (HCC)   . Scoliosis     Past Surgical History:  Procedure Laterality Date  . ABDOMINAL HYSTERECTOMY     Family History: No family history on file.  Social History:  Social History   Substance and Sexual Activity  Alcohol Use No     Social History   Substance and Sexual Activity  Drug Use No    Social History   Socioeconomic History  . Marital status: Single    Spouse name: Not on file  . Number of children: Not on file  . Years of education: Not on file  . Highest education level: Not on file  Occupational History  . Not on file  Tobacco Use  . Smoking status: Never Smoker  . Smokeless tobacco: Never Used  Substance and Sexual Activity  . Alcohol use: No  . Drug use: No  . Sexual activity: Not on file  Other Topics Concern  . Not on file  Social History Narrative  . Not on file   Social Determinants of Health   Financial Resource Strain: Not on file  Food Insecurity: Not on file  Transportation Needs: Not on file  Physical Activity: Not on file  Stress: Not on file  Social Connections: Not on file   Additional Social History:    Allergies:   Allergies  Allergen Reactions  . Paliperidone     Other reaction(s): Hives / Skin Rash  . Quetiapine Fumarate Rash    Other reaction(s): Hives / Skin Rash  . Risperidone Rash    Other reaction(s): Hives / Skin Rash  . Diphenhydramine Hcl     Other reaction(s): Difficulty breathing, Hives / Skin Rash  . Latex  Rash    Other reaction(s): Rash, Rash Other reaction(s): DERMATITIS Other reaction(s): DERMATITIS     Labs:  Results for orders placed or performed during the hospital encounter of 02/27/20 (from the past 48 hour(s))  Glucose, capillary     Status: Abnormal   Collection Time: 03/26/20  6:43 AM  Result Value Ref Range   Glucose-Capillary 135 (H) 70 - 99 mg/dL    Comment: Glucose reference range applies only to samples taken after fasting for at least 8 hours.  Glucose, capillary     Status: Abnormal   Collection Time: 03/26/20  4:38 PM  Result Value Ref Range   Glucose-Capillary 166 (H) 70 - 99 mg/dL    Comment: Glucose reference range applies only to samples taken after fasting for at least 8 hours.  Glucose, capillary     Status: Abnormal   Collection Time: 03/26/20  8:51 PM  Result Value Ref Range  Glucose-Capillary 101 (H) 70 - 99 mg/dL    Comment: Glucose reference range applies only to samples taken after fasting for at least 8 hours.  CBC     Status: Abnormal   Collection Time: 03/27/20  4:13 AM  Result Value Ref Range   WBC 8.7 4.0 - 10.5 K/uL   RBC 3.65 (L) 3.87 - 5.11 MIL/uL   Hemoglobin 10.2 (L) 12.0 - 15.0 g/dL   HCT 60.4 (L) 54.0 - 98.1 %   MCV 89.9 80.0 - 100.0 fL   MCH 27.9 26.0 - 34.0 pg   MCHC 31.1 30.0 - 36.0 g/dL   RDW 19.1 47.8 - 29.5 %   Platelets 254 150 - 400 K/uL   nRBC 0.0 0.0 - 0.2 %    Comment: Performed at [facility], [address], [place], [place] [address]  Iron and TIBC     Status: None   Collection Time: 03/27/20  4:13 AM  Result Value Ref Range   Iron 45 28 - 170 ug/dL   TIBC 865 784 - 696 ug/dL   Saturation Ratios 16 10.4 - 31.8 %   UIBC 236 ug/dL    Comment: Performed at [facility], [address], [place], [place] [address]  Ferritin     Status: None   Collection Time: 03/27/20  4:13 AM  Result Value Ref Range   Ferritin 255 11 - 307 ng/mL    Comment: Performed at [facility], [address],  [place], [place] [address]  Vitamin B12     Status: None   Collection Time: 03/27/20  4:13 AM  Result Value Ref Range   Vitamin B-12 888 180 - 914 pg/mL    Comment: (NOTE) This assay is not validated for testing neonatal or myeloproliferative syndrome specimens for Vitamin B12 levels. Performed at [facility], [address], [place], [place] [address]   Glucose, capillary     Status: Abnormal   Collection Time: 03/27/20  6:40 AM  Result Value Ref Range   Glucose-Capillary 121 (H) 70 - 99 mg/dL    Comment: Glucose reference range applies only to samples taken after fasting for at least 8 hours.  Glucose, capillary     Status: Abnormal   Collection Time: 03/27/20 11:42 AM  Result Value Ref Range   Glucose-Capillary 172 (H) 70 - 99 mg/dL    Comment: Glucose reference range applies only to samples taken after fasting for at least 8 hours.    Current Facility-Administered Medications  Medication Dose Route Frequency Provider Last Rate Last Admin  . acetaminophen (TYLENOL) tablet 650 mg  650 mg Oral Q6H PRN Jacques Navy, MD   650 mg at 03/03/20 2156   Or  . acetaminophen (TYLENOL) suppository 650 mg  650 mg Rectal Q6H PRN Norins, Rosalyn Gess, MD      . amantadine (SYMMETREL) 50 MG/5ML solution 100 mg  100 mg Oral BID Lodema Hong A, RPH   100 mg at 03/27/20 1059  . aspirin EC tablet 81 mg  81 mg Oral Daily Theotis Barrio, MD   81 mg at 03/27/20 1059  . atorvastatin (LIPITOR) tablet 40 mg  40 mg Oral Daily Norins, Rosalyn Gess, MD   40 mg at 03/27/20 1059  . benztropine (COGENTIN) tablet 0.5 mg  0.5 mg Oral BID Dagar, Geralynn Rile, MD   0.5 mg at 03/27/20 1059  . feeding supplement (ENSURE ENLIVE / ENSURE PLUS) liquid 237 mL  237  mL Oral TID BM Reymundo PollGuilloud, Carolyn, MD   237 mL at 03/27/20 1058  . heparin injection 5,000 Units  5,000 Units Subcutaneous Q8H Theotis BarrioLee, Joshua K, MD   5,000 Units at 03/27/20 [phone]  . insulin aspart (novoLOG) injection 0-15 Units  0-15 Units Subcutaneous TID WC Norins,  Rosalyn GessMichael E, MD   2 Units at 03/27/20 [phone]  . lisinopril (ZESTRIL) tablet 20 mg  20 mg Oral Daily Dagar, Anjali, MD   20 mg at 03/27/20 1059  . LORazepam (ATIVAN) tablet 0.25 mg  0.25 mg Oral QHS PRN Dagar, Geralynn RileAnjali, MD   0.25 mg at 03/25/20 2135   Or  . LORazepam (ATIVAN) injection 0.25 mg  0.25 mg Intramuscular QHS PRN Dagar, Geralynn RileAnjali, MD      . melatonin tablet 3 mg  3 mg Oral QHS Dagar, Geralynn RileAnjali, MD   3 mg at 03/26/20 2203  . multivitamin with minerals tablet 1 tablet  1 tablet Oral Daily Reymundo PollGuilloud, Carolyn, MD   1 tablet at 03/27/20 1059  . OLANZapine (ZYPREXA) tablet 10 mg  10 mg Oral QHS Dagar, Geralynn RileAnjali, MD   10 mg at 03/26/20 2203  . pantoprazole (PROTONIX) EC tablet 40 mg  40 mg Oral Daily Theotis BarrioLee, Joshua K, MD   40 mg at 03/27/20 1059  . polyethylene glycol (MIRALAX / GLYCOLAX) packet 17 g  17 g Oral BID Verdene LennertBasaraba, Iulia, MD   17 g at 03/27/20 1059  . senna (SENOKOT) tablet 17.2 mg  2 tablet Oral BID Verdene LennertBasaraba, Iulia, MD   17.2 mg at 03/27/20 1059  . sertraline (ZOLOFT) tablet 100 mg  100 mg Oral Daily Maryagnes AmosStarkes-Perry, Takia S, FNP   100 mg at 03/27/20 1059  . traZODone (DESYREL) tablet 200 mg  200 mg Oral QHS Dagar, Geralynn RileAnjali, MD   200 mg at 03/26/20 2203      Psychiatric Specialty Exam: Physical Exam Vitals and nursing note reviewed.  Constitutional:      Appearance: Normal appearance.  HENT:     Head: Normocephalic.     Nose: Nose normal.  Pulmonary:     Effort: Pulmonary effort is normal.  Musculoskeletal:     Cervical back: Normal range of motion.  Neurological:     Mental Status: She is alert. She is disoriented.  Psychiatric:        Attention and Perception: She is inattentive. She perceives auditory and visual hallucinations.        Mood and Affect: Mood is anxious. Affect is blunt.        Speech: Speech normal.        Behavior: Behavior is agitated.        Thought Content: Thought content normal.        Cognition and Memory: Cognition is impaired. Memory is impaired.         Judgment: Judgment is impulsive.     Review of Systems  Psychiatric/Behavioral: Positive for confusion and hallucinations. The patient is nervous/anxious.   All other systems reviewed and are negative.   Blood pressure (!) 118/104, pulse 64, temperature (!) 97.4 F (36.3 C), temperature source Oral, resp. rate 14, weight 72.6 kg, SpO2 97 %.Body mass index is 29.26 kg/m.  General Appearance: Fairly Groomed  Eye Contact:  Good  Speech:  Clear and Coherent  Volume:  Normal  Mood:  Euthymic  Affect:  Appropriate  Thought Process:  Linear  Orientation:  Other:  Oriented to person, not place or time  Thought Content:  Hallucinations: Auditory Visual and Paranoid Ideation  Suicidal Thoughts:  No  Homicidal Thoughts:  No  Memory:  Recent;   Poor Remote;   Poor  Judgement:  Impaired  Insight:  Lacking  Psychomotor Activity:  Mannerisms  Concentration:  Concentration: Poor  Recall:  Poor  Fund of Knowledge:  Poor  Language:  Fair  Akathisia:  No  Handed:  Right  AIMS (if indicated):     Assets:  Social Support  ADL's:  Impaired  Cognition:  Impaired,  Moderate  Sleep:      Treatment Plan Summary:  64 year old woman with history of schizoaffective illness currently hospitalized after acute kidney injury, continuing to present with AMS, psychosis. At the current time, the patient is experiencing psychotic symptoms which put her at danger to herself and further hinder her previously poor ability to complete ADLs. Due to this, I would agree that the patient meets criteria for geriatric inpatient psychiatry services.   The exact cause of her decompensation is not known; looking at the history shows poor psychiatric follow-up and possible missed doses of IM Long Acting Injectable antipsychotics. Dehydration and the recent kidney injury also very likely played a role in the patient's AMS and is known to have prolonged course of delirium even after Creatinine values return to normal,  especially in the geriatric psychiatric population.  The patient's overall status is on the decline as her cognition is showing typical age related changes of patient with schizophrenia which compromise a dementia like picture. Therefore her safety in the home will continue to be question as this patient continues to age, especially as this patient has multiple co-morbidities and risk factors including insulin-dependent diabetes. Further conversation should be had with family to include long term goals of care, possible future nursing facility placement as well as Healthcare proxy status.  Currently patient is on a high dose of Trazodone, and a moderate dose of sertraline and a moderate dose of Zyprexa, all of which are serotonergic and can possibly prolong QTC . Her Zyprexa dose was recently added, and appears to be helping patient stabilize, however she continues to have psychotic symptoms. Zyprexa dose should be raised, and at the same time her trazodone dose should be decreased. The QHS Ativan should be used sparingly and only when absolutely necessary as this has shown to worsen confusion in patients with delirium/dementia. Further labs should be ordered to rule out other possible causes of AMS/ worsening psychosis. Patient should have an EKG performed and psych medications held if QTC is significantly elevated.  Consider the following recommendations:  Obtain EKG- hold trazodone and zyprexa if QTC significantly elevated  Increase QHS Zyprexa to 2.5 mg in the am and continue the  Decrease QHS trazodone to 100 mg Change QHS ativan to be administered on a PRN basis Continue with QHS melatonin Continue Benztropine  QHS Continue with Sertraline  Continue Amantadine  Family conversation about suspected course/ timeline  Disposition: Recommend psychiatric Inpatient admission when medically cleared.  Nanine Means, NP 03/27/2020 2:36 PM

## 2020-03-27 NOTE — Progress Notes (Signed)
° ° °  Subjective: HD#28 No acute overnight events.  Ms. Sherral Hammers evaluated at bedside this AM. Her sister was present at bedside as well. She states she has successfully made the voices go away today and is not seeing any thing unusual.   Objective:  Vital signs in last 24 hours: Vitals:   03/26/20 1826 03/26/20 2052 03/27/20 0432 03/27/20 1000  BP: 106/90 (!) 157/125 113/64 (!) 118/104  Pulse: 82 79 60 64  Resp: 18 18 20 14   Temp: 97.6 F (36.4 C) 98.5 F (36.9 C) 97.6 F (36.4 C) (!) 97.4 F (36.3 C)  TempSrc:  Oral Oral Oral  SpO2: 100% 98% 99% 97%  Weight:  72.6 kg     CBC Latest Ref Rng & Units 03/27/2020 03/06/2020 02/29/2020  WBC 4.0 - 10.5 K/uL 8.7 6.0 6.4  Hemoglobin 12.0 - 15.0 g/dL 10.2(L) 9.4(L) 8.6(L)  Hematocrit 36.0 - 46.0 % 32.8(L) 30.6(L) 26.8(L)  Platelets 150 - 400 K/uL 254 260 223   EKG:   Sinus bradycardia Otherwise normal ECG No significant change  QTc: 431  Physical exam: General :Well developed, well nourished, middle aged female sitting comfortably in bed, NAD. Neuro: AAO*3 Psych: Normal mood and affect, clearly responding to internal stimuli.  Assessment/Plan: Jessica Robinsonis a 64 y.o.with PMH of schizoaffective disorder with paranoia, HTN, HLD, DM, Depressionadmitted for acute kidney injury, improved nowandSchizoaffective disorder.  Principal Problem:   Paranoid schizophrenia, chronic condition with acute exacerbation (HCC) Active Problems:   AKI (acute kidney injury) (HCC)   DM (diabetes mellitus), type 2 (HCC)   HTN (hypertension), benign   HLD (hyperlipidemia)   Acute kidney injury (HCC)   Hallucinations   Delirium   Anemia  #Schizoaffective disorder Obtained EKG and QTc is not prolonged. Vitamin B12 is 888, Hb 10.2, better than last time and Iron studies are normal. Patient is clearly responding to internal stimuli. Denies suicidal or homicidal ideations. Psych is consulted again today and waiting for their recommendations.     - Appreciate psychiatry's recommendations  - Awaiting geriatric psychiatry placement  - Zoloft 100mg  daily - Trazodone  200mg  qHS -Zyprexa to 10 mgQHS - Melatonin 3 mg QHS - Ativan 0.25 mg PO or IM QHS PRN  # Drug-induced extrapyramidal movement disorder  - C/w Amantadine 100mg  BID - Change to Benztropine 0.5 mg BID   # Acute Kidney Injury, sCr~1.48, may be her new baseline. # Chronic Kidney Disease 3a - Avoid nephrotoxic agents  - Encourage PO intake - Regular diet - Ensure supplement  Prior to Admission Living Arrangement:Home Anticipated Discharge Location:Inpatient psychiatry Barriers to Discharge:Placement   Dr. 65 Shanaya Schneck Pager: [phone] After 5pm on weekdays and 1pm on weekends: On Call pager [phone]  03/27/2020, 12:55 PM

## 2020-03-28 DIAGNOSIS — N179 Acute kidney failure, unspecified: Secondary | ICD-10-CM | POA: Diagnosis not present

## 2020-03-28 DIAGNOSIS — F200 Paranoid schizophrenia: Secondary | ICD-10-CM | POA: Diagnosis not present

## 2020-03-28 DIAGNOSIS — I129 Hypertensive chronic kidney disease with stage 1 through stage 4 chronic kidney disease, or unspecified chronic kidney disease: Secondary | ICD-10-CM | POA: Diagnosis not present

## 2020-03-28 DIAGNOSIS — N1831 Chronic kidney disease, stage 3a: Secondary | ICD-10-CM | POA: Diagnosis not present

## 2020-03-28 LAB — GLUCOSE, CAPILLARY
Glucose-Capillary: 114 mg/dL — ABNORMAL HIGH (ref 70–99)
Glucose-Capillary: 129 mg/dL — ABNORMAL HIGH (ref 70–99)
Glucose-Capillary: 215 mg/dL — ABNORMAL HIGH (ref 70–99)
Glucose-Capillary: 111 mg/dL — ABNORMAL HIGH (ref 70–99)

## 2020-03-28 NOTE — Progress Notes (Signed)
    Subjective: HD#29  No acute overnight events. Jessica Campbell evaluated at bedside this AM. Patient very sleepy this morning and difficult to rouse.   Objective:  Vital signs in last 24 hours: Vitals:   03/27/20 1616 03/27/20 2055 03/28/20 0454 03/28/20 1001  BP:  107/63 137/72 (!) 149/66  Pulse: 61 70 (!) 50 63  Resp: 16   18  Temp: 97.6 F (36.4 C) 98.5 F (36.9 C) 98.9 F (37.2 C) 97.7 F (36.5 C)  TempSrc:      SpO2: 93% 95% 96% 98%  Weight:  73.9 kg     Physical exam: General: Well developed, well nourished. NAD. Neuro: Lethargic and sleeping. Wakes to touch and sound.   Assessment/Plan: Jessica Robinsonis a 64 y.o.with PMH of schizoaffective disorder with paranoia, HTN, HLD, DM, Depressionadmitted for acute kidney injury, improved nowandSchizoaffective disorder.  Principal Problem:   Paranoid schizophrenia, chronic condition with acute exacerbation (HCC) Active Problems:   AKI (acute kidney injury) (HCC)   DM (diabetes mellitus), type 2 (HCC)   HTN (hypertension), benign   HLD (hyperlipidemia)   Acute kidney injury (HCC)   Hallucinations   Delirium   Anemia  #Schizoaffective disorder When awake, patient continues to respond to internal stimuli. Denies suicidal or homicidal ideations. Medication changes made recently include addition of AM Zyprexa and decrease in nighttime Trazodone.  Despite these, patient quite sedated this morning.   Will need to discuss with psychiatry patient's need for inpatient psych at this time. She has not had any aggressive episodes in over 2 weeks and does not appear to be a danger to herself or others. Will also need to discuss social workers's request for IVC, as at this time, there is no indication for IVC.   - Appreciate psychiatry's recommendations  - Awaiting geriatric psychiatry placement  - Zoloft 100mg  daily - Decrease Trazodone  To 100mg  qHS -Zyprexa 2.5 mg in AM and 10 mgQHS - Melatonin 3 mg QHS - Ativan 0.25  mg PO or IM QHS PRN  # Drug-induced extrapyramidal movement disorder - C/w Amantadine 100mg  BID - C/W Benztropine 0.5 mg BID   # Acute Kidney Injury, sCr~1.48, may be her new baseline. # Chronic Kidney Disease 3a - Avoid nephrotoxic agents  - Encourage PO intake - Regular diet - Ensure supplement  Prior to Admission Living Arrangement:Home Anticipated Discharge Location:Inpatient psychiatry Barriers to Discharge:Placement   Dr. Dagar Pager: [phone] After 5pm on weekdays and 1pm on weekends: On Call pager [phone]  03/28/2020, 11:29 AM

## 2020-03-28 NOTE — Progress Notes (Signed)
Physical Therapy Treatment Patient Details Name: Jessica Campbell MRN: [id number] DOB: 04/06/1956 Today's Date: 03/28/2020    History of Present Illness Jessica Campbell is a 64 y.o. with PMH of schizoaffective disorder with paranoia, HTN, HLD, DM, depression admitted for hallucinations and drug induced extrapyramidal movement disorder.  Noted plan is for placement in geriatric psych unit.   PT Comments    Pt continues to be limited by hallucinations, decreased ability to follow commands, and strong extensor tone.  Session focused on trunk control and worked on trunk rotation in attempt to improve control and inhibit extensor tone.  Pt still with strong extensor tone in standing with limits mobility.  Pt did have good participation with facilitation technique.  Need assist of 2 to progress.    Follow Up Recommendations  SNF     Equipment Recommendations  Wheelchair cushion (measurements PT);Wheelchair (measurements PT);3in1 (PT)    Recommendations for Other Services       Precautions / Restrictions Precautions Precautions: Fall Precaution Comments: visual and auditory hallucinations, multidirectional and unpredictable balance loss    Mobility  Bed Mobility Overal bed mobility: Needs Assistance Bed Mobility: Rolling;Supine to Sit;Sit to Supine Rolling: Min assist   Supine to sit: Mod assist Sit to supine: Max assist   General bed mobility comments: Rolling: rolled both sides x 2 with cues and facilitation to reach across body; Supine to sit required assist for legs but then she reached with arm to pull up; Sit to supine: More of stand to supine after transfer back to bed pt with strong extensor tone and was laid down on on bed with use of gait belt to lower  Transfers Overall transfer level: Needs assistance Equipment used: 1 person hand held assist Transfers: Sit to/from [employer] Sit to Stand: Mod assist;Max assist Stand pivot transfers: Mod assist;Max  assist;+2 physical assistance;+2 safety/equipment       General transfer comment: Pt trying to get up to [facility] and reaching for armrest to move to [facility]: Initially mod A to stand and start transfer as pt reaching for armrest of BSC; however, once at [facility] pt standing straight with posterior and R lean and uanble to coordinate sitting requiring assist at hips to flex.  For transfer back to bed required max A due to very strong extensor tone and basically having to be laid down to supine from standing position.  Tried to encourage forward lean with foot positioning and having pt reach forward to therapist but unable.  Ambulation/Gait             General Gait Details: unable to keep pt up safely to attempt steps   Stairs             Wheelchair Mobility    Modified Rankin (Stroke Patients Only)       Balance Overall balance assessment: Needs assistance Sitting-balance support: Feet supported;Bilateral upper extremity supported Sitting balance-Leahy Scale: Poor Sitting balance - Comments: Pt with tendency to lean posteriorly requiring min A at times.  Worked at [employer] on leaning forward and reaching for chair in front of pt.  Sat EOB for 3 mins then pt stating needed to get to [facility]. As pt unable to be returned to EOB she was returned to bed and then into long sitting position.  Worked on trunk rotation and leaning forward in long-sitting: had pt reach for therapist hands (therapist at foot of bed) then facilitated trunk rotation x 15 bil.  Able to progress to pt holding bed rails  and therapist further facilitating trunk rotation at trunk x 5 bil. Postural control: Posterior lean Standing balance support: Bilateral upper extremity supported Standing balance-Leahy Scale: Zero Standing balance comment: Strong posteior lean requiring max A .                            Cognition Arousal/Alertness: Lethargic Behavior During Therapy: Impulsive;Restless Overall Cognitive Status: No  family/caregiver present to determine baseline cognitive functioning Area of Impairment: Problem solving;Awareness;Safety/judgement;Following commands;Attention;Orientation;Memory                 Orientation Level: Disoriented to;Person;Place;Time;Situation Current Attention Level: Sustained Memory: Decreased recall of precautions;Decreased short-term memory Following Commands: Follows one step commands inconsistently;Follows multi-step commands inconsistently Safety/Judgement: Decreased awareness of safety;Decreased awareness of deficits Awareness: Intellectual Problem Solving: Slow processing;Requires verbal cues;Requires tactile cues;Difficulty sequencing General Comments: Pt with limited verbalizations.  She did state "i need to use the bathroom."  She was asleep at arrival but once awoken was able to stay awake      Exercises      General Comments        Pertinent Vitals/Pain Pain Assessment: No/denies pain    Home Living                      Prior Function            PT Goals (current goals can now be found in the care plan section) Acute Rehab PT Goals Patient Stated Goal: none stated PT Goal Formulation: With patient Time For Goal Achievement: 03/29/20 Potential to Achieve Goals: Fair Progress towards PT goals: Progressing toward goals    Frequency    Min 2X/week      PT Plan Current plan remains appropriate    Co-evaluation              AM-PAC PT "6 Clicks" Mobility   Outcome Measure  Help needed turning from your back to your side while in a flat bed without using bedrails?: A Lot Help needed moving from lying on your back to sitting on the side of a flat bed without using bedrails?: A Lot Help needed moving to and from a bed to a chair (including a wheelchair)?: Total Help needed standing up from a chair using your arms (e.g., wheelchair or bedside chair)?: Total Help needed to walk in hospital room?: Total Help needed climbing  3-5 steps with a railing? : Total 6 Click Score: 8    End of Session Equipment Utilized During Treatment: Gait belt Activity Tolerance: Patient tolerated treatment well Patient left: in bed;with call bell/phone within reach;with bed alarm set Nurse Communication: Mobility status PT Visit Diagnosis: Unsteadiness on feet (R26.81);History of falling (Z91.81);Other abnormalities of gait and mobility (R26.89)     Time: 1610-9604 PT Time Calculation (min) (ACUTE ONLY): 20 min  Charges:  $Therapeutic Activity: 8-22 mins                     Anise Salvo, PT Acute Rehab Services Pager [phone] [facility] [phone]     Rayetta Humphrey 03/28/2020, 3:29 PM

## 2020-03-29 DIAGNOSIS — E119 Type 2 diabetes mellitus without complications: Secondary | ICD-10-CM | POA: Diagnosis not present

## 2020-03-29 DIAGNOSIS — N179 Acute kidney failure, unspecified: Secondary | ICD-10-CM | POA: Diagnosis not present

## 2020-03-29 DIAGNOSIS — F200 Paranoid schizophrenia: Secondary | ICD-10-CM | POA: Diagnosis not present

## 2020-03-29 DIAGNOSIS — R443 Hallucinations, unspecified: Secondary | ICD-10-CM | POA: Diagnosis not present

## 2020-03-29 LAB — GLUCOSE, CAPILLARY
Glucose-Capillary: 188 mg/dL — ABNORMAL HIGH (ref 70–99)
Glucose-Capillary: 182 mg/dL — ABNORMAL HIGH (ref 70–99)
Glucose-Capillary: 126 mg/dL — ABNORMAL HIGH (ref 70–99)
Glucose-Capillary: 209 mg/dL — ABNORMAL HIGH (ref 70–99)
Glucose-Capillary: 174 mg/dL — ABNORMAL HIGH (ref 70–99)
Glucose-Capillary: 145 mg/dL — ABNORMAL HIGH (ref 70–99)
Glucose-Capillary: 137 mg/dL — ABNORMAL HIGH (ref 70–99)
Glucose-Capillary: 204 mg/dL — ABNORMAL HIGH (ref 70–99)
Glucose-Capillary: 129 mg/dL — ABNORMAL HIGH (ref 70–99)
Glucose-Capillary: 234 mg/dL — ABNORMAL HIGH (ref 70–99)
Glucose-Capillary: 149 mg/dL — ABNORMAL HIGH (ref 70–99)
Glucose-Capillary: 155 mg/dL — ABNORMAL HIGH (ref 70–99)

## 2020-03-29 MED ORDER — TRAZODONE HCL 50 MG PO TABS
50.0000 mg | ORAL_TABLET | Freq: Every day | ORAL | Status: DC
  Administered 2020-03-29 – 2020-03-31 (×3): 50 mg via ORAL
  Filled 2020-03-29 (×3): qty 1

## 2020-03-29 NOTE — Progress Notes (Signed)
    Subjective: HD#30  No acute overnight events. Jessica Campbell evaluated at bedside this AM. Denies any suicidal or homicidal ideations. Admits to auditory and visual hallucinations but denies any command hallucinations.  Of note: Trying to find outpatient psychiatrist appointment for patient today.    Objective:  Vital signs in last 24 hours: Vitals:   03/28/20 1814 03/28/20 2018 03/29/20 0540 03/29/20 1035  BP: 138/74 116/63 126/60 (!) 118/51  Pulse: 70 (!) 58 61 71  Resp: 18 18 18 18   Temp: 97.9 F (36.6 C) 99.2 F (37.3 C) 98.6 F (37 C) 98.4 F (36.9 C)  TempSrc:  Oral Oral Oral  SpO2: 98% 100% 97% 96%  Weight:       Physical exam: General: Well developed, well nourished. NAD. CV: RRR, S1, S2 normal, No m/r/g Pulmonary: CTAB Neuro: AAO*3 Psych: Normal mood and affect, +AVH  Assessment/Plan: Jessica Robinsonis a 64 y.o.with PMH of schizoaffective disorder with paranoia, HTN, HLD, DM, Depressionadmitted for acute kidney injury, improved nowandSchizoaffective disorder.  Principal Problem:   Paranoid schizophrenia, chronic condition with acute exacerbation (HCC) Active Problems:   AKI (acute kidney injury) (HCC)   DM (diabetes mellitus), type 2 (HCC)   HTN (hypertension), benign   HLD (hyperlipidemia)   Acute kidney injury (HCC)   Hallucinations   Delirium   Anemia  #Schizoaffective disorder Patient continues to respond to internal stimuli. Admits to auditory and visual hallucinations Denies suicidal or homicidal ideations. No evidence of aggression in weeks now. Psych consulted today, waiting for their recommendations.   - Appreciate psychiatry's recommendations  - Awaiting geriatric psychiatry placement  - Zoloft 100mg  daily - Trazodone 100mg  qHS -Zyprexa 2.5 mg in AM and 10 mgQHS - Melatonin 3 mg QHS - Ativan 0.25 mg PO or IM QHS PRN  # Drug-induced extrapyramidal movement disorder - C/w Amantadine 100mg  BID - C/W Benztropine 0.5 mg BID   #  Acute Kidney Injury, sCr~1.48, may be her new baseline. # Chronic Kidney Disease 3a - Avoid nephrotoxic agents  - Encourage PO intake - Regular diet - Ensure supplement  Prior to Admission Living Arrangement:Home Anticipated Discharge Location:Inpatient psychiatry Barriers to Discharge:Placement   Dr. [address] Puertas Pager: [phone] After 5pm on weekdays and 1pm on weekends: On Call pager [phone]  03/29/2020, 11:53 AM

## 2020-03-29 NOTE — Plan of Care (Signed)
  Problem: Safety: Goal: Ability to remain free from injury will improve Outcome: Progressing   Problem: Coping: Goal: Level of anxiety will decrease Outcome: Progressing   

## 2020-03-29 NOTE — TOC Progression Note (Signed)
Transition of Care [facility]) - Progression Note    Patient Details  Name: Jessica Campbell MRN: [id number] Date of Birth: Sep 27, 1955  Transition [facility]) CM/SW Contact  Okey Dupre Lazaro Arms, LCSW Phone Number: 03/29/2020, 12:59 PM  Clinical Narrative:   [facility] contacted (10:42 am) and pre-assessment completed. [facility] referral, face sheet, labs and vitals faxed. Clinical notes faxed separately via Epic  CSW will continue search for a psychiatric facility.        Expected Discharge Plan and Services - Psychiatric facility                                                 Social Determinants of Health (SDOH) Interventions  Patient needs placement in psych facility  Readmission Risk Interventions No flowsheet data found.

## 2020-03-29 NOTE — TOC Progression Note (Addendum)
Transition of Care [facility]) - Progression Note    Patient Details  Name: Jessica Campbell MRN: [id number] Date of Birth: 12/20/1955  Transition of Care [facility]) CM/SW Contact  Okey Dupre Lazaro Arms, LCSW Phone Number: 03/29/2020, 2:37 PM  Clinical Narrative:  Completed Regional Referral Form and faxed with needed clinicals to [facility]. Later called and confirmed receipt of information. CSW received a call from Deanna with [facility] requesting a SW note regarding what patient's plan is after psychiatric placement.  CSW visited with patient to discuss her plans post psychiatric placement. [name] was lying in bed and was awake, alert, pleasant and agreeable to talking with CSW. [name] informed that CSW currently working on placement for her at a facility that will address her mental health needs, and patient nodded understanding. [name] was asked about her plans post-discharge from a mental health facility and she plans to return home with her sister [name]. [facility] and plans to continue going to their day center during the week.   CSW advised [name] that her [facility] social worker Marylene Land has been checking in with CSW regarding her progress and would continue to follow her once she goes to a facility that will help with her mental health needs. CSW encouraged patient to share with her [facility] social worker any discharge paperwork that she receives, as Marylene Land can assist her with any recommendations made by the psychiatric facility.    2:49 pm: Faxed above progress note to [facility]. Call made to [facility] and spoke to the intake person requesting that he inform Deanna that progress note on patient faxed.    3:05 pm: Spoke with Deanna at [facility] and was advised that the CMO will review patient's information next week and determine if she is appropriate for their wait list.  CSW will continue search for a psychiatric facility for  patient.        Expected Discharge Plan and Services - Psychiatric placement                                              Social Determinants of Health (SDOH) Interventions  Patient in need of mental health services.  Readmission Risk Interventions No flowsheet data found.

## 2020-03-29 NOTE — Consult Note (Signed)
[facility] Face-to-Face Psychiatry Consult   Reason for Consult: Schizoaffective disorder  Referring Physician:  Dr. Huel Campbell Patient Identification: Jessica Campbell MRN:  [id number] Principal Diagnosis: Paranoid schizophrenia, chronic condition with acute exacerbation (HCC) Diagnosis:  Principal Problem:   Paranoid schizophrenia, chronic condition with acute exacerbation (HCC) Active Problems:   AKI (acute kidney injury) (HCC)   DM (diabetes mellitus), type 2 (HCC)   HTN (hypertension), benign   HLD (hyperlipidemia)   Acute kidney injury (HCC)   Hallucinations   Delirium   Anemia   Total Time spent : 35 minutes  Subjective:   Jessica Campbell is a 64 y.o. female patient who presented with worsening psychosis and was found to have an acute kidney injury upon laboratory evaluation.   HPI:   Client visited by this provider who was asleep on her bed and did not awaken to her name.  This provider spoke to her nurse who said that she is been doing well and that her sister commented that "she looks so much better."  She had been taking care of her all day and did not have any issues, appears psychiatrically stable at this time.  Her sisters one of which is her caregiver were called to no avail.  The one number of Jessica Campbell [phone] number is not working.  The number to Jessica Campbell, her other sister, [phone] was called twice with no answer voicemail left and will attempt one more time.  The client lives with one of the sisters and her husband care for her.  When this provider saw her on Tuesday she was actively hallucinating and very agitated.  Dr. Lucianne Campbell reviewed this client and agrees with psychiatric clearance with follow-up in outpatient.  12/21: 64 yo female with agitation on assessment, hallucinating.  One of her sister's was at her bedside and reports she was talking to their mother who is dead and seeing people.  Mumbling at time during the assessment.  Alert, not oriented.  Patient  repositioned with her sister's assistance who she typically leaves with.  She reports she dressed herself and did her ADLs prior to admission to the hospital.  Her sister cooked for her and washed her clothes.  High level of functioning.  Zyprexa 10 mg stated a few days ago with positive effects until today.  She is restless and trying to get out of bed.  Reviewed medications and made adjustments to assist her mood and hallucinations.    12/18: Patient has been seen by psychiatry primary team. Please see initial note for more detailed history of presenting illness. Basically patient was presenting with worsening of her psychosis and inability to maintain ADLs which led to her admission. Patient was found to have a Acute Kidney injury which is now resolved according to primary team.   Patent has displayed psychotic symptoms while here in the hospital including auditory and visual hallucinations. Her medications were adjusted by the primary psych consult team and the patient has been cross-tapered from Aripiprazole to Olanzapine and is currently receiving 7.5mg  QHS. In addition to this,  patient is receiving Cogentin, Amantadine, Sertraline and Trazodone as well as Ativan.  Upon exam today, patient is pleasant. She is not overly paranoid but eagerly tells Clinical research associate of a man standing over her bed last night and speaking to her in an unknown language. Patient states that this is distressing to her, but despite this is not feeling any desire to harm herself or others. A brief cognitive exam shows patient with poor memory, poor  cognition and is still not fully oriented. Patient is unable to tell me where she is or why she is here. She is unable to state the date or month. She is unable to tell me the president.    Patient's relative, Jessica Campbell, was at bedside and able to provide some collateral information in regards to the patient's progress. Jessica Campbell reports that the patient's condition upon admission was very  worrisome as the patient was unable to take care of herself and there were many concerns of her ability to function in the home with only the assistance of her sister. Jessica Campbell reports that since that time, the patient's condition has improved but is not yet back to baseline. The initial recommendations of inpatient psychiatry placement were discussed with sister who is agreeable and feels as if that would still be the best course of action at this point. It was also discussed with Jessica Campbell that due to the patient's improving symptoms, there may be a point in the coming days where she no longer meets criteria for inpatient psychiatric care and a family decision would likely have to be made about having the patient return home vs nursing home placement.    Past Psychiatric History: Notable for schizoaffective disorder and prior medication trials including Risperidone, Paliperidone, Quetiapine. Patient has been inpatient before, most recently this past year. She is unable to provide further history of her illness at this time.  Risk to Self:  yes Risk to Others:  no Prior Inpatient Therapy:  yes Prior Outpatient Therapy:   no  Past Medical History:  Past Medical History:  Diagnosis Date  . Borderline diabetes   . Depression   . Diabetes mellitus without complication (HCC)   . High cholesterol   . Hypertension   . Schizoaffective disorder (HCC)   . Scoliosis     Past Surgical History:  Procedure Laterality Date  . ABDOMINAL HYSTERECTOMY     Family History: No family history on file.  Social History:  Social History   Substance and Sexual Activity  Alcohol Use No     Social History   Substance and Sexual Activity  Drug Use No    Social History   Socioeconomic History  . Marital status: Single    Spouse name: Not on file  . Number of children: Not on file  . Years of education: Not on file  . Highest education level: Not on file  Occupational History  . Not on file  Tobacco Use   . Smoking status: Never Smoker  . Smokeless tobacco: Never Used  Substance and Sexual Activity  . Alcohol use: No  . Drug use: No  . Sexual activity: Not on file  Other Topics Concern  . Not on file  Social History Narrative  . Not on file   Social Determinants of Health   Financial Resource Strain: Not on file  Food Insecurity: Not on file  Transportation Needs: Not on file  Physical Activity: Not on file  Stress: Not on file  Social Connections: Not on file   Additional Social History:    Allergies:   Allergies  Allergen Reactions  . Paliperidone     Other reaction(s): Hives / Skin Rash  . Quetiapine Fumarate Rash    Other reaction(s): Hives / Skin Rash  . Risperidone Rash    Other reaction(s): Hives / Skin Rash  . Diphenhydramine Hcl     Other reaction(s): Difficulty breathing, Hives / Skin Rash  . Latex Rash  Other reaction(s): Rash, Rash Other reaction(s): DERMATITIS Other reaction(s): DERMATITIS     Labs:  Results for orders placed or performed during the hospital encounter of 02/27/20 (from the past 48 hour(s))  Glucose, capillary     Status: Abnormal   Collection Time: 03/27/20  5:15 PM  Result Value Ref Range   Glucose-Capillary 134 (H) 70 - 99 mg/dL    Comment: Glucose reference range applies only to samples taken after fasting for at least 8 hours.  Glucose, capillary     Status: Abnormal   Collection Time: 03/27/20  9:24 PM  Result Value Ref Range   Glucose-Capillary 106 (H) 70 - 99 mg/dL    Comment: Glucose reference range applies only to samples taken after fasting for at least 8 hours.  Glucose, capillary     Status: Abnormal   Collection Time: 03/28/20  6:36 AM  Result Value Ref Range   Glucose-Capillary 114 (H) 70 - 99 mg/dL    Comment: Glucose reference range applies only to samples taken after fasting for at least 8 hours.  Glucose, capillary     Status: Abnormal   Collection Time: 03/28/20 11:25 AM  Result Value Ref Range    Glucose-Capillary 129 (H) 70 - 99 mg/dL    Comment: Glucose reference range applies only to samples taken after fasting for at least 8 hours.  Glucose, capillary     Status: Abnormal   Collection Time: 03/28/20  4:20 PM  Result Value Ref Range   Glucose-Capillary 215 (H) 70 - 99 mg/dL    Comment: Glucose reference range applies only to samples taken after fasting for at least 8 hours.  Glucose, capillary     Status: Abnormal   Collection Time: 03/28/20  8:31 PM  Result Value Ref Range   Glucose-Capillary 111 (H) 70 - 99 mg/dL    Comment: Glucose reference range applies only to samples taken after fasting for at least 8 hours.  Glucose, capillary     Status: Abnormal   Collection Time: 03/29/20  6:42 AM  Result Value Ref Range   Glucose-Capillary 129 (H) 70 - 99 mg/dL    Comment: Glucose reference range applies only to samples taken after fasting for at least 8 hours.  Glucose, capillary     Status: Abnormal   Collection Time: 03/29/20 11:08 AM  Result Value Ref Range   Glucose-Capillary 234 (H) 70 - 99 mg/dL    Comment: Glucose reference range applies only to samples taken after fasting for at least 8 hours.    Current Facility-Administered Medications  Medication Dose Route Frequency Provider Last Rate Last Admin  . acetaminophen (TYLENOL) tablet 650 mg  650 mg Oral Q6H PRN Jacques Navy, MD   650 mg at 03/03/20 2156   Or  . acetaminophen (TYLENOL) suppository 650 mg  650 mg Rectal Q6H PRN Norins, Rosalyn Gess, MD      . amantadine (SYMMETREL) 50 MG/5ML solution 100 mg  100 mg Oral BID Lodema Hong A, RPH   100 mg at 03/29/20 1125  . aspirin EC tablet 81 mg  81 mg Oral Daily Theotis Barrio, MD   81 mg at 03/29/20 1115  . atorvastatin (LIPITOR) tablet 40 mg  40 mg Oral Daily Norins, Rosalyn Gess, MD   40 mg at 03/29/20 1115  . benztropine (COGENTIN) tablet 1 mg  1 mg Oral QHS Charm Rings, NP   1 mg at 03/28/20 2125  . feeding supplement (ENSURE ENLIVE / ENSURE PLUS) liquid  237 mL   237 mL Oral TID BM Reymundo Poll, MD   237 mL at 03/29/20 1116  . heparin injection 5,000 Units  5,000 Units Subcutaneous Q8H Theotis Barrio, MD   5,000 Units at 03/29/20 [phone]  . insulin aspart (novoLOG) injection 0-15 Units  0-15 Units Subcutaneous TID WC Norins, Rosalyn Gess, MD   5 Units at 03/29/20 1126  . lisinopril (ZESTRIL) tablet 20 mg  20 mg Oral Daily Dagar, Anjali, MD   20 mg at 03/29/20 1115  . LORazepam (ATIVAN) tablet 0.25 mg  0.25 mg Oral QHS PRN Dagar, Geralynn Rile, MD   0.25 mg at 03/25/20 2135   Or  . LORazepam (ATIVAN) injection 0.25 mg  0.25 mg Intramuscular QHS PRN Dagar, Geralynn Rile, MD      . melatonin tablet 3 mg  3 mg Oral QHS Dagar, Geralynn Rile, MD   3 mg at 03/28/20 2126  . multivitamin with minerals tablet 1 tablet  1 tablet Oral Daily Reymundo Poll, MD   1 tablet at 03/29/20 1115  . OLANZapine (ZYPREXA) tablet 10 mg  10 mg Oral QHS Dagar, Geralynn Rile, MD   10 mg at 03/28/20 2125  . OLANZapine (ZYPREXA) tablet 2.5 mg  2.5 mg Oral Daily Charm Rings, NP   2.5 mg at 03/29/20 1115  . pantoprazole (PROTONIX) EC tablet 40 mg  40 mg Oral Daily Theotis Barrio, MD   40 mg at 03/29/20 1116  . polyethylene glycol (MIRALAX / GLYCOLAX) packet 17 g  17 g Oral BID Verdene Lennert, MD   17 g at 03/29/20 1116  . senna (SENOKOT) tablet 17.2 mg  2 tablet Oral BID Verdene Lennert, MD   17.2 mg at 03/29/20 1115  . sertraline (ZOLOFT) tablet 100 mg  100 mg Oral Daily Maryagnes Amos, FNP   100 mg at 03/29/20 1115  . traZODone (DESYREL) tablet 100 mg  100 mg Oral QHS Charm Rings, NP   100 mg at 03/28/20 2125      Psychiatric Specialty Exam: Physical Exam Vitals and nursing note reviewed.  Constitutional:      Appearance: Normal appearance.  HENT:     Head: Normocephalic.     Nose: Nose normal.  Pulmonary:     Effort: Pulmonary effort is normal.  Musculoskeletal:     Cervical back: Normal range of motion.  Neurological:     Mental Status: She is alert. She is disoriented.   Psychiatric:        Mood and Affect: Affect is blunt.        Speech: Speech normal.        Behavior: Behavior is cooperative.        Thought Content: Thought content normal.        Cognition and Memory: Cognition is impaired. Memory is impaired.        Judgment: Judgment is impulsive.     Review of Systems  All other systems reviewed and are negative.   Blood pressure (!) 118/51, pulse 71, temperature 98.4 F (36.9 C), temperature source Oral, resp. rate 18, weight 73.9 kg, SpO2 96 %.Body mass index is 29.81 kg/m.  General Appearance: Fairly Groomed  Eye Contact:  UTA, asleep, would not awaken  Speech:  UTA  Volume:  UTA  Mood:  Resting quietly  Affect:  Blunted  Thought Process:  UTA  Orientation:  UTA  Thought Content:  Appear to be at her baseline  Suicidal Thoughts:  No  Homicidal Thoughts:  No  Memory:  UTA  Judgement:  UTA  Insight:  UTA  Psychomotor Activity:  Decreased  Concentration:  UTA  Recall:  SUPERVALU INCUTA  Fund of Knowledge:  UTA  Language:  UTA  Akathisia:  No  Handed:  Right  AIMS (if indicated):     Assets:  Social Support  ADL's:  Impaired  Cognition:  ADL  Sleep:      Treatment Plan Summary:  64 year old woman with history of schizoaffective illness currently hospitalized after acute kidney injury, continuing to present with AMS, psychosis. At the current time, the patient is experiencing psychotic symptoms which put her at danger to herself and further hinder her previously poor ability to complete ADLs. Due to this, I would agree that the patient meets criteria for geriatric inpatient psychiatry services.   The exact cause of her decompensation is not known; looking at the history shows poor psychiatric follow-up and possible missed doses of IM Long Acting Injectable antipsychotics. Dehydration and the recent kidney injury also very likely played a role in the patient's AMS and is known to have prolonged course of delirium even after Creatinine values  return to normal, especially in the geriatric psychiatric population.  The patient's overall status is on the decline as her cognition is showing typical age related changes of patient with schizophrenia which compromise a dementia like picture. Therefore her safety in the home will continue to be question as this patient continues to age, especially as this patient has multiple co-morbidities and risk factors including insulin-dependent diabetes. Further conversation should be had with family to include long term goals of care, possible future nursing facility placement as well as Healthcare proxy status.  Schizophrenia: Continue QHS Zyprexa to 2.5 mg in the am and continue the 10mg   Insomnia Decreased QHS trazodone 100 mg to 50 mg Continue with QHS melatonin 3 mg at bedtime  EPS: Continue Benztropine 1mg  QHS Continue Amantadine 50 mg BID  Depression: Continue with Sertraline 100mg   Disposition: Recommend psychiatric Inpatient admission when medically cleared.  Nanine MeansJamison Alann Avey, NP 03/29/2020 2:00 PM

## 2020-03-30 LAB — GLUCOSE, CAPILLARY
Glucose-Capillary: 116 mg/dL — ABNORMAL HIGH (ref 70–99)
Glucose-Capillary: 138 mg/dL — ABNORMAL HIGH (ref 70–99)
Glucose-Capillary: 152 mg/dL — ABNORMAL HIGH (ref 70–99)
Glucose-Capillary: 183 mg/dL — ABNORMAL HIGH (ref 70–99)

## 2020-03-30 MED ORDER — LISINOPRIL 10 MG PO TABS
10.0000 mg | ORAL_TABLET | Freq: Every day | ORAL | Status: DC
  Administered 2020-03-31 – 2020-04-04 (×5): 10 mg via ORAL
  Filled 2020-03-30 (×5): qty 1

## 2020-03-30 NOTE — Plan of Care (Signed)
  Problem: Safety: Goal: Ability to remain free from injury will improve Outcome: Progressing   

## 2020-03-30 NOTE — Plan of Care (Signed)
  Problem: Education: Goal: Knowledge of General Education information will improve Description Including pain rating scale, medication(s)/side effects and non-pharmacologic comfort measures Outcome: Progressing   

## 2020-03-30 NOTE — Progress Notes (Signed)
Occupational Therapy Treatment Patient Details Name: Jessica Campbell MRN: [id number] DOB: 1955/11/01 Today's Date: 03/30/2020    History of present illness Jessica Campbell is a 64 y.o. with PMH of schizoaffective disorder with paranoia, HTN, HLD, DM, depression admitted for hallucinations and durg induced extrapyramidal movement disorder.   OT comments  Pt progressing towards established OT goals and is very agreeable to participate in therapy today. Pt donning socks and shoes while seated at EOB with Min guard-Mod A for sitting balance; pt presenting with right lateral lean and LOB. Pt performing functional mobility to/from sink with Mod-Max A and RW. Requiring Min-Mod A for standing balance while performing oral care at sink and Min cues for sequencing. Pt continue to present with poor balance, strength, and cognition impacting her safe performance of ADLs and functional mobility.    Follow Up Recommendations  SNF    Equipment Recommendations  3 in 1 bedside commode;Other (comment) (RW)    Recommendations for Other Services      Precautions / Restrictions Precautions Precautions: Fall Precaution Comments: visual and auditory hallucinations, multidirectional and unpredictable balance loss Restrictions Weight Bearing Restrictions: No       Mobility Bed Mobility Overal bed mobility: Needs Assistance Bed Mobility: Supine to Sit     Supine to sit: Min assist     General bed mobility comments: Min A to elevate trunk  Transfers Overall transfer level: Needs assistance Equipment used: Rolling walker (2 wheeled) Transfers: Sit to/from Stand Sit to Stand: Min assist;Max assist         General transfer comment: Min A for safe power up and then Min-Max A for gaining and maintaining balance in standing    Balance Overall balance assessment: Needs assistance Sitting-balance support: Feet supported;Bilateral upper extremity supported Sitting balance-Leahy Scale: Fair Sitting  balance - Comments: Able to maintain static sitting. During donning shoes, pt using figure four method and requiring Min Guard0Mod A for prevent of fall to right Postural control: Right lateral lean Standing balance support: Bilateral upper extremity supported;During functional activity Standing balance-Leahy Scale: Poor Standing balance comment: Mod-Max A for maintaining standing balance                           ADL either performed or assessed with clinical judgement   ADL Overall ADL's : Needs assistance/impaired     Grooming: Oral care;Minimal assistance;Moderate assistance;Maximal assistance;Standing;Cueing for sequencing;Cueing for safety Grooming Details (indicate cue type and reason): Pt requiring Min-Max A for standing balance while standing at sink. Pt completing oral care with Min cues for sequencing and attention. Pt with difficulty initially twisting tooth paste cap off. Unable to place cap back on tooth paste             Lower Body Dressing: Moderate assistance;Min guard;Sit to/from stand Lower Body Dressing Details (indicate cue type and reason): Pt donning socks and tennis shoes with Min guard A - Mod A for sitting balance. Pt with tendency for right lateral lean and LOB. Pt requiring assistance for tying right shoe Toilet Transfer: Moderate assistance;RW (simulated to recliner)           Functional mobility during ADLs: Moderate assistance;Maximal assistance;Rolling walker General ADL Comments: Pt continues to present with poor balance, cognition, and safety. Pt with unpredicted LOB in standing and requiring Mod-Max A for correction and fall prevention. Pt performing oral care, LB dressing, and functional mobility     Vision   Vision Assessment?: No apparent visual deficits  Perception     Praxis      Cognition Arousal/Alertness: Awake/alert Behavior During Therapy: Impulsive Overall Cognitive Status: Impaired/Different from baseline Area of  Impairment: Problem solving;Awareness;Safety/judgement;Following commands;Attention;Orientation;Memory                 Orientation Level: Disoriented to;Place;Situation Current Attention Level: Sustained Memory: Decreased recall of precautions;Decreased short-term memory Following Commands: Follows one step commands inconsistently;Follows multi-step commands inconsistently Safety/Judgement: Decreased awareness of safety;Decreased awareness of deficits Awareness: Intellectual Problem Solving: Slow processing;Requires verbal cues;Requires tactile cues;Difficulty sequencing General Comments: Pt happy to see therapist and very agreeable to therapy. Pt with poor attention, ST memory, and awareness of deficits. Pt moving impulsively and poor insight into LOB        Exercises     Shoulder Instructions       General Comments VSS    Pertinent Vitals/ Pain       Pain Assessment: No/denies pain  Home Living                                          Prior Functioning/Environment              Frequency  Min 2X/week        Progress Toward Goals  OT Goals(current goals can now be found in the care plan section)  Progress towards OT goals: Progressing toward goals  Acute Rehab OT Goals Patient Stated Goal: none stated OT Goal Formulation: With patient/family Time For Goal Achievement: 04/03/20 Potential to Achieve Goals: Good ADL Goals Pt Will Perform Lower Body Dressing: sit to/from stand;with supervision Pt Will Perform Toileting - Clothing Manipulation and hygiene: sit to/from stand;with supervision Pt Will Perform Tub/Shower Transfer: grab bars (NA) Additional ADL Goal #1: pt will demonstrate 2 step command 2 out 4 attempts  Plan Discharge plan remains appropriate;Frequency remains appropriate    Co-evaluation                 AM-PAC OT "6 Clicks" Daily Activity     Outcome Measure   Help from another person eating meals?: A  Little Help from another person taking care of personal grooming?: A Lot Help from another person toileting, which includes using toliet, bedpan, or urinal?: Total Help from another person bathing (including washing, rinsing, drying)?: Total Help from another person to put on and taking off regular upper body clothing?: A Lot Help from another person to put on and taking off regular lower body clothing?: A Lot 6 Click Score: 11    End of Session Equipment Utilized During Treatment: Gait belt;Rolling walker  OT Visit Diagnosis: Unsteadiness on feet (R26.81);History of falling (Z91.81);Other symptoms and signs involving cognitive function   Activity Tolerance Patient tolerated treatment well   Patient Left with call bell/phone within reach;in chair;with chair alarm set   Nurse Communication Mobility status        Time: 2947-6546 OT Time Calculation (min): 20 min  Charges: OT General Charges $OT Visit: 1 Visit OT Treatments $Self Care/Home Management : 8-22 mins  Author [place] MSOT, OTR/L Acute Rehab Pager: [phone] Office: [phone]   Theodoro Grist Dimitrious Micciche 03/30/2020, 12:05 PM

## 2020-03-30 NOTE — Progress Notes (Signed)
    Subjective: HD#31  No acute overnight events. [name] evaluated at bedside this AM. She was sleeping comfortably in her bed. Woke up and states she feels fine. Denies any suicidal or homicidal ideations. States heard voices last night.   Of note: Psychiatry cleared the patient for discharge with close outpatient follow up. Talked to her Dr. Regino Schultze at [facility] and their SW is looking to find short term reahabitilation facility, with close outpatient follow up.  Objective:  Vital signs in last 24 hours: Vitals:   03/29/20 1035 03/29/20 1641 03/29/20 2102 03/30/20 0415  BP: (!) 118/51 (!) 104/58 (!) 113/43 (!) 111/50  Pulse: 71 60 63 (!) 59  Resp: 18 17 18 18   Temp: 98.4 F (36.9 C) 98.6 F (37 C) 98.4 F (36.9 C) 98.2 F (36.8 C)  TempSrc: Oral     SpO2: 96% 95% 96% 98%  Weight:   74 kg    Physical exam: General: Well developed, well nourished female lying comfortably in bed, NAD. CV: RRR, S1, S2 normal, No m/r/g Neuro: AAO*3 Psych: Normal mood and affect, +AVH  Assessment/Plan: Jessica Robinsonis a 64 y.o.with PMH of schizoaffective disorder with paranoia, HTN, HLD, DM, Depressionadmitted for acute kidney injury, improved nowandSchizoaffective disorder.  Principal Problem:   Paranoid schizophrenia, chronic condition with acute exacerbation (HCC) Active Problems:   AKI (acute kidney injury) (HCC)   DM (diabetes mellitus), type 2 (HCC)   HTN (hypertension), benign   HLD (hyperlipidemia)   Acute kidney injury (HCC)   Hallucinations   Delirium   Anemia  #Schizoaffective disorder Patient continues to respond to internal stimuli. Admits to auditory and visual hallucinations Denies suicidal or homicidal ideations. No evidence of aggression in weeks now. Psych consulted yesterday and their cleared her for discharge with close outpatient follow up. Ms. Care is a PACE patient and after conversation with Dr. Roxan Hockey, most amicable situation at this point is her going to  SNF with close outpatient follow up. Our SW and SW at [facility] are working towards it.    - Appreciate psychiatry's recommendations  - Zoloft 100mg  daily - Decrease  Trazodone to 50 mg qHS -Zyprexa 2.5 mg in AM and 10 mgQHS - Melatonin 3 mg QHS - Ativan 0.25 mg PO or IM QHS PRN  # Drug-induced extrapyramidal movement disorder - Decrease  Amantadine to 50 mg BID - C/W Benztropine 0.5 mg BID   # Acute Kidney Injury, sCr~1.48, may be her new baseline. # Chronic Kidney Disease 3a - Avoid nephrotoxic agents  - Encourage PO intake - Regular diet - Ensure supplement  Prior to Admission Living Arrangement:Home Anticipated Discharge Location:Inpatient psychiatry Barriers to Discharge:Placement   Dr. [facility] Kiki Bivens Pager: [phone] After 5pm on weekdays and 1pm on weekends: On Call pager [phone]  03/30/2020, 8:24 AM

## 2020-03-30 NOTE — Hospital Course (Signed)
Jessica Campbell is a 64 y.o. with PMH of schizoaffective disorder with paranoia, HTN, HLD, DM, Depression admitted for acute kidney injury, improved now and Schizoaffective disorder.  Schizoaffective disorder Patient continues to respond to internal stimuli. Admits to auditory and visual hallucinations Denies suicidal or homicidal ideations. No evidence of aggression in weeks now. Psych consulted yesterday and their cleared her for discharge with close outpatient follow up. [name] is a PACE patient and after conversation with Dr. Regino Schultze, most amicable situation at this point is her going to SNF with close outpatient follow up. Our SW and SW at [facility] are working towards it.    - Appreciate psychiatry's recommendations  - Zoloft 100mg  daily - Decrease  Trazodone to 50 mg qHS  - Zyprexa 2.5 mg in AM and 10 mg QHS - Melatonin 3 mg QHS - Ativan 0.25 mg PO or IM QHS PRN   # Drug-induced extrapyramidal movement disorder - Decrease  Amantadine to 50 mg BID - C/W Benztropine 0.5 mg BID    # Acute Kidney Injury, sCr~1.48, may be her new baseline. # Chronic Kidney Disease 3a - Avoid nephrotoxic agents  - Encourage PO intake  - Regular diet - Ensure supplement

## 2020-03-31 DIAGNOSIS — F200 Paranoid schizophrenia: Secondary | ICD-10-CM | POA: Diagnosis not present

## 2020-03-31 DIAGNOSIS — R443 Hallucinations, unspecified: Secondary | ICD-10-CM | POA: Diagnosis not present

## 2020-03-31 DIAGNOSIS — N179 Acute kidney failure, unspecified: Secondary | ICD-10-CM | POA: Diagnosis not present

## 2020-03-31 DIAGNOSIS — E119 Type 2 diabetes mellitus without complications: Secondary | ICD-10-CM | POA: Diagnosis not present

## 2020-03-31 LAB — GLUCOSE, CAPILLARY
Glucose-Capillary: 124 mg/dL — ABNORMAL HIGH (ref 70–99)
Glucose-Capillary: 165 mg/dL — ABNORMAL HIGH (ref 70–99)
Glucose-Capillary: 119 mg/dL — ABNORMAL HIGH (ref 70–99)
Glucose-Capillary: 121 mg/dL — ABNORMAL HIGH (ref 70–99)

## 2020-03-31 NOTE — Significant Event (Signed)
Notified by RN that patient had a fall at 8:15 PM. RN went into the room after bed alarm went off and found patient on the floor near the door.  when evaluated at bedside, patient appears in no acute distress.  She is alert, awake and oriented to person and time only.  She can answer some questions but mumbling at time.  She also exhibited hallucination by talking to a person who was not in the room.  Akathisia and lipsmacking noted.  She states that her right temporal area is sore from the fall.  No vomiting after the fall per RN.  Physical exam General: In no acute distress, alert, awake, oriented to person and time.  Can answer some questions appropriately.  Cardio: Regular rate and rhythm Pulmonary: Breathing comfortably in room air Neuro: Difficult to perform a thorough neuro exam given patient's restlessness.  Neuro exam grossly normal.  MSK: Patient is moving all the extremities freely.  Normal range of motion of all extremities.  Bilateral hips stable.  Bilateral knees stable MCL and LCL.  Negative anterior drawer and posterior drawer test.  Cervical nontender to palpation.  No expression of pain with midline palpation. Psych: Exhibiting hallucination.  Patient is not agitated.  Assessment and plan No imaging indicated per [place] CT head rule and Nexus C-spine criteria.  Will monitor her closely and make sure patient does not get out of bed.  We will give her bedtime dose of trazodone and Zyprexa now.  Avoid restraints.  Will recheck neuro exam tomorrow for any changes.

## 2020-03-31 NOTE — Consult Note (Signed)
Patient appears to be at her baseline, unable to get in touch with her sisters, multiple attempts.  One number was not operational and the other number did not answer (attempted 3 times).  Based on the nurse's input, patient's sister stated she was doing much better.  Considering the sister who cares for her input, recommend return home with home health services or transfer to a SNF unless they feel she is not at her baseline psychiatrically.  Nanine Means, PMHNP

## 2020-03-31 NOTE — Progress Notes (Signed)
    Subjective: HD#32  [name] is feeling good today and she slept well overnight. She denies any thoughts to harm herself or others. The voices are mean but they are always mean, and overall, this is getting better. She tried to walk yesterday and we encouraged her to continue working on it.   [name]'s sister is at bedside with another sister on the phone. Family was updated.   Objective:  Vital signs in last 24 hours: Vitals:   03/30/20 1028 03/30/20 1649 03/30/20 2202 03/31/20 0537  BP: (!) 106/50 101/90 104/80 110/68  Pulse: 60 66 63 70  Resp: 18 18 18 18   Temp: 98.4 F (36.9 C) 98.9 F (37.2 C) 98 F (36.7 C) 98 F (36.7 C)  TempSrc: Oral Oral  Oral  SpO2: 94% 97% 99% 99%  Weight:       Physical exam: General: Well developed, well nourished female lying comfortably in bed, NAD. CV: RRR, S1, S2 normal, No m/r/g Neuro: AAO*3 Psych: Normal mood and affect, +AVH. Mood overall improving.   Assessment/Plan: Jessica Robinsonis a 64 y.o.with PMH of schizoaffective disorder with paranoia, HTN, HLD, DM, Depressionadmitted for acute kidney injury, improved nowandSchizoaffective disorder.  Principal Problem:   Paranoid schizophrenia, chronic condition with acute exacerbation (HCC) Active Problems:   AKI (acute kidney injury) (HCC)   DM (diabetes mellitus), type 2 (HCC)   HTN (hypertension), benign   HLD (hyperlipidemia)   Acute kidney injury (HCC)   Hallucinations   Delirium   Anemia  #Schizoaffective disorder Patient continues to respond to internal stimuli. Admits to auditory and visual hallucinations Denies suicidal or homicidal ideations. No evidence of aggression in weeks now. Psych consulted and cleared her for discharge with close outpatient follow up. [name] is a PACE patient and after conversation with Dr. Roxan Hockey, most amicable situation at this point is her going to SNF with close outpatient follow up. Our SW and SW at [facility] are working towards it.  No updates on placement yet.    - Appreciate psychiatry's recommendations  - Zoloft 100mg  daily - Trazodone to 50 mg qHS -Zyprexa 2.5 mg in AM and 10 mgQHS - Melatonin 3 mg QHS - Ativan 0.25 mg PO or IM QHS PRN  # Drug-induced extrapyramidal movement disorder - Amantadine 100 mg BID - Benztropine 0.5 mg BID   # Acute Kidney Injury, sCr~1.48, may be her new baseline. # Chronic Kidney Disease 3a - Avoid nephrotoxic agents  - Encourage PO intake - Regular diet - Ensure supplement  # Physical Deconditioning In the setting of prolonged hospitalization  - Continue working with PT/OT   Prior to Admission Living Arrangement:Home Anticipated Discharge Location:SNF Barriers to Discharge:Placement only. Patient is medically cleared.   Dr. [facility] Dagar Pager: [phone] After 5pm on weekdays and 1pm on weekends: On Call pager [phone]  03/31/2020, 6:43 AM

## 2020-04-01 LAB — GLUCOSE, CAPILLARY
Glucose-Capillary: 137 mg/dL — ABNORMAL HIGH (ref 70–99)
Glucose-Capillary: 84 mg/dL (ref 70–99)
Glucose-Capillary: 122 mg/dL — ABNORMAL HIGH (ref 70–99)
Glucose-Capillary: 79 mg/dL (ref 70–99)

## 2020-04-01 MED ORDER — LORAZEPAM 2 MG/ML IJ SOLN
0.5000 mg | Freq: Once | INTRAMUSCULAR | Status: AC
  Administered 2020-04-01: 0.5 mg via INTRAMUSCULAR

## 2020-04-01 MED ORDER — LORAZEPAM 2 MG/ML IJ SOLN: 1.0000 mg | Freq: Once | INTRAMUSCULAR | Status: DC

## 2020-04-01 MED ORDER — TRAZODONE HCL 100 MG PO TABS
100.0000 mg | ORAL_TABLET | Freq: Every day | ORAL | Status: DC
  Administered 2020-04-01 – 2020-04-15 (×15): 100 mg via ORAL
  Filled 2020-04-01 (×16): qty 1

## 2020-04-01 MED ORDER — LORAZEPAM 2 MG/ML IJ SOLN
1.0000 mg | Freq: Once | INTRAMUSCULAR | Status: AC
  Administered 2020-04-01: 1 mg via INTRAMUSCULAR

## 2020-04-01 MED ORDER — LORAZEPAM 2 MG/ML IJ SOLN
INTRAMUSCULAR | Status: AC
  Filled 2020-04-01: qty 1

## 2020-04-01 NOTE — Progress Notes (Signed)
°   03/31/20 2030  What Happened  Was fall witnessed? Yes  Who witnessed fall? Artemio Aly RN  Patients activity before fall other (comment) (lying in bed)  Point of contact other (comment) (right side of face)  Was patient injured? No  Follow Up  MD notified DR Cyndie Chime  Time MD notified 2051  Family notified Yes - comment Tarry Kos)  Time family notified 2100  Additional tests No  Simple treatment Other (comment) (none)  Adult Fall Risk Assessment  Risk Factor Category (scoring not indicated) High fall risk per protocol (document High fall risk)  Age 64  Fall History: Fall within 6 months prior to admission 0  Elimination; Bowel and/or Urine Incontinence 0  Elimination; Bowel and/or Urine Urgency/Frequency 0  Medications: includes PCA/Opiates, Anti-convulsants, Anti-hypertensives, Diuretics, Hypnotics, Laxatives, Sedatives, and Psychotropics 5  Patient Care Equipment 1  Mobility-Assistance 2  Mobility-Gait 2  Mobility-Sensory Deficit 2  Altered awareness of immediate physical environment 1  Impulsiveness 2  Lack of understanding of one's physical/cognitive limitations 4  Total Score 20  Adult Fall Risk Interventions  Required Bundle Interventions *See Row Information* High fall risk - low, moderate, and high requirements implemented  Additional Interventions Reorient/diversional activities with confused patients;Room near nurses station  Screening for Fall Injury Risk (To be completed on HIGH fall risk patients) - Assessing Need for Low Bed  Risk For Fall Injury- Low Bed Criteria None identified - Continue screening  Will Implement Low Bed and Floor Mats Yes  Screening for Fall Injury Risk (To be completed on HIGH fall risk patients who do not meet crieteria for Low Bed) - Assessing Need for Floor Mats Only  Risk For Fall Injury- Criteria for Floor Mats Confusion/dementia (+NuDESC, CIWA, TBI, etc.)

## 2020-04-01 NOTE — Progress Notes (Signed)
° ° °  Subjective: HD#33 Around 8:15 PM, pt had a fall. On physical exam-pt was not in acute distress, alert, awake, oriented to person and time. Neuro exam grossly normal. Moving all extremities freely, B/L hips stable. She was hallucinating but not agitated at all, pleasant on conversation.  Jessica Campbell evaluated at bedside this AM.  She was wide awake and was eating her breakfast. Denies suicidal or homicidal ideations, admits to auditory and visual hallucinations. She remembers  Of note: Nurse reported patient was wide awake last night.   Objective:  Vital signs in last 24 hours: Vitals:   03/31/20 1935 03/31/20 2020 03/31/20 2045 04/01/20 0139  BP: 128/64 130/60 137/60 (!) 153/72  Pulse: 65 72 64 70  Resp: 18 20 18 18   Temp: 98.3 F (36.8 C) 98.4 F (36.9 C) 98.2 F (36.8 C) 99.4 F (37.4 C)  TempSrc: Oral Oral Oral Oral  SpO2: 98% 99% 99% 96%  Weight:       Physical exam: General: Well developed, well nourished female sitting comfortably in bed, NAD. Neuro: Alert and oriented to time and person. Difficult to perform thorough neuro exam due to pt's EPS but grossly normal.  Psych: Normal mood and affect, +AVH. Mood overall improving.   Assessment/Plan: Jessica Robinsonis a 64 y.o.with PMH of schizoaffective disorder with paranoia, HTN, HLD, DM, Depressionadmitted for acute kidney injury, improved nowandSchizoaffective disorder.  Principal Problem:   Paranoid schizophrenia, chronic condition with acute exacerbation (HCC) Active Problems:   AKI (acute kidney injury) (HCC)   DM (diabetes mellitus), type 2 (HCC)   HTN (hypertension), benign   HLD (hyperlipidemia)   Acute kidney injury (HCC)   Hallucinations   Delirium   Anemia  #Schizoaffective disorder Patient continues to respond to internal stimuli. Admits to auditory and visual hallucinations Denies suicidal or homicidal ideations. No evidence of aggression in weeks now. She had a fall yesterday nut neuro exam  grossly normal and was not aggressive, stated stumbled while going to bathroom. Psych consulted and cleared her for discharge with close outpatient follow up. Jessica Campbell is a PACE patient and after conversation with Dr. Roxan Hockey, most amicable situation at this point is her going to SNF with close outpatient follow up. Our SW and SW at [facility] are working towards it. No updates on placement yet.    - Appreciate psychiatry's recommendations  - Zoloft 100mg  daily - Increase Trazodone to 100 mg qHS -Zyprexa 2.5 mg in AM and 10 mgQHS - Melatonin 3 mg QHS - Ativan 0.25 mg PO or IM QHS PRN  # Drug-induced extrapyramidal movement disorder - Amantadine 100 mg BID - Benztropine 0.5 mg BID   # Acute Kidney Injury, sCr~1.48, may be her new baseline. # Chronic Kidney Disease 3a - Avoid nephrotoxic agents  - Encourage PO intake - Regular diet - Ensure supplement  # Physical Deconditioning In the setting of prolonged hospitalization  - Continue working with PT/OT   Prior to Admission Living Arrangement:Home Anticipated Discharge Location:SNF Barriers to Discharge:Placement only. Patient is medically cleared.   Dr. [facility] Ebba Goll Pager: [phone] After 5pm on weekdays and 1pm on weekends: On Call pager [phone]  04/01/2020, 9:06 AM

## 2020-04-01 NOTE — Progress Notes (Signed)
Patient remains confused, agitated, and restless in bed. Patient hallucinating kicking legs and and punching at something in air and mumbling.When calling patient  name clearly says," yes" and when asked her name clearly states," Jessica Campbell."Will give patient prn dose of ativan.

## 2020-04-01 NOTE — Progress Notes (Signed)
No change in patient assessment or behavior after ativan given.Dr. Cyndie Chime notified to come to bedside to evaluate patient.

## 2020-04-02 DIAGNOSIS — I129 Hypertensive chronic kidney disease with stage 1 through stage 4 chronic kidney disease, or unspecified chronic kidney disease: Secondary | ICD-10-CM | POA: Diagnosis not present

## 2020-04-02 DIAGNOSIS — F200 Paranoid schizophrenia: Secondary | ICD-10-CM | POA: Diagnosis not present

## 2020-04-02 DIAGNOSIS — N1831 Chronic kidney disease, stage 3a: Secondary | ICD-10-CM | POA: Diagnosis not present

## 2020-04-02 DIAGNOSIS — N179 Acute kidney failure, unspecified: Secondary | ICD-10-CM | POA: Diagnosis not present

## 2020-04-02 LAB — GLUCOSE, CAPILLARY
Glucose-Capillary: 114 mg/dL — ABNORMAL HIGH (ref 70–99)
Glucose-Capillary: 114 mg/dL — ABNORMAL HIGH (ref 70–99)
Glucose-Capillary: 162 mg/dL — ABNORMAL HIGH (ref 70–99)
Glucose-Capillary: 162 mg/dL — ABNORMAL HIGH (ref 70–99)

## 2020-04-02 MED ORDER — WHITE PETROLATUM EX OINT
TOPICAL_OINTMENT | CUTANEOUS | Status: AC
  Administered 2020-04-02: 0.2
  Filled 2020-04-02: qty 28.35

## 2020-04-02 NOTE — Progress Notes (Signed)
PT Cancellation Note  Patient Details Name: Jessica Campbell MRN: [id number] DOB: 15-Feb-1956   Cancelled Treatment:    Reason Eval/Treat Not Completed: Other (comment) per RN and chart, patient was very agitated/combative last night and was medicated accordingly for patient/staff safety. Not appropriate for PT today due to reduced LOC from medications. Will f/u on next date of service.    Madelaine Etienne, DPT, PN1   Supplemental Physical Therapist [facility]    Pager [phone] Acute Rehab Office [phone]

## 2020-04-02 NOTE — Progress Notes (Signed)
° ° °  Subjective: HD# 34 Patient got agitated last night as had not slept in 2 days. Received 1.75 mg of Ativan IM. Jessica Campbell evaluated at bedside this AM. Unable to access as was sleeping and wanted to take a nap. Was extremely pleasant on arousal though.     Objective:  Vital signs in last 24 hours: Vitals:   04/01/20 1648 04/01/20 2130 04/02/20 0600 04/02/20 0818  BP: (!) 123/91 (!) 116/50 (!) 141/46 (!) 165/45  Pulse: 69 63 (!) 54 (!) 55  Resp: 20 20 18 14   Temp: 99.3 F (37.4 C) 99.1 F (37.3 C) 97.6 F (36.4 C) 97.6 F (36.4 C)  TempSrc:  Oral Oral Oral  SpO2: 98% 96% 98% 96%  Weight:       Physical exam: General: Well developed, well nourished female lying comfortably in bed, NAD.  Assessment/Plan: Jessica Robinsonis a 64 y.o.with PMH of schizoaffective disorder with paranoia, HTN, HLD, DM, Depressionadmitted for acute kidney injury, improved nowandSchizoaffective disorder.  Principal Problem:   Paranoid schizophrenia, chronic condition with acute exacerbation (HCC) Active Problems:   AKI (acute kidney injury) (HCC)   DM (diabetes mellitus), type 2 (HCC)   HTN (hypertension), benign   HLD (hyperlipidemia)   Acute kidney injury (HCC)   Hallucinations   Delirium   Anemia  #Schizoaffective disorder Patient continues to respond to internal stimuli. Admits to auditory and visual hallucinations Denies suicidal or homicidal ideations. Patient was agitated last night and received 1.75 mg of Ativan and slept comfortably after it.  Psych is consulted and waiting for their recommendations.Jessica Campbell is a PACE patient and after conversation with Dr. Roxan Hockey, most amicable situation at this point is her going to SNF with close outpatient follow up. Our SW and SW at [facility] are working towards it. No updates on placement yet.    - Appreciate psychiatry's recommendations  - Zoloft 100mg  daily - Trazodone 100 mg qHS -Zyprexa 2.5 mg in AM and 10 mgQHS - Melatonin 3 mg  QHS - Ativan 0.25 mg PO or IM QHS PRN  # Drug-induced extrapyramidal movement disorder - Amantadine 100 mg BID - Benztropine 0.5 mg BID   # Acute Kidney Injury, sCr~1.48, may be her new baseline. # Chronic Kidney Disease 3a - Avoid nephrotoxic agents  - Encourage PO intake - Regular diet - Ensure supplement  # Physical Deconditioning In the setting of prolonged hospitalization  - Continue working with PT/OT   Prior to Admission Living Arrangement:Home Anticipated Discharge Location:SNF Barriers to Discharge:Placement only. Patient is medically cleared.   Dr. [facility] Jessica Campbell Pager: [phone] After 5pm on weekdays and 1pm on weekends: On Call pager [phone]  04/02/2020, 11:38 AM

## 2020-04-02 NOTE — Plan of Care (Signed)
  Problem: Pain Managment: Goal: General experience of comfort will improve Outcome: Progressing   

## 2020-04-02 NOTE — Consult Note (Addendum)
[facility] Face-to-Face Psychiatry Consult   Reason for Consult: Agitation last night as had not slept in 3 days Referring Physician:  Dr. Geralynn Rile Dagar Patient Identification: Jessica Campbell MRN:  [id number] Principal Diagnosis: Paranoid schizophrenia, chronic condition with acute exacerbation (HCC) Diagnosis:  Principal Problem:   Paranoid schizophrenia, chronic condition with acute exacerbation (HCC) Active Problems:   AKI (acute kidney injury) (HCC)   DM (diabetes mellitus), type 2 (HCC)   HTN (hypertension), benign   HLD (hyperlipidemia)   Acute kidney injury (HCC)   Hallucinations   Delirium   Anemia   Total Time spent : 15 minutes  Subjective:   Jessica Campbell is a 64 y.o. female patient who presented with worsening psychosis and was found to have an acute kidney injury upon laboratory evaluation.   HPI: Patient was seen and attempted to assess by this nurse practitioner. Upon entry into patient's room she was observed to be asleep, this nurse practitioner attempted multiple times to wake patient to include loud yelling of name and physically shaking of patient's shoulder to arouse her and which was unsuccessful. Psych consult was placed as patient has not slept in 3 days, however this occasion and previous evaluation patient was asleep during the day. Patient was very difficult to arouse almost somnolent, yet resting peacefully. Unable to complete evaluation.   As previously noted patient does have diagnosis of paranoid schizophrenia, therefore complete resolution of both auditory and visual hallucinations will be difficult to obtain in a inpatient setting if complete cessation at all. Patient continues to remain psychiatrically stable despite having intermittent hallucinations at this time. Patient with ongoing psychotic symptoms to include restlessness, agitation, and hallucinations are of normal findings. Her current medications include amantadine 100 mg p.o. twice daily, Cogentin 1  mg p.o. nightly, olanzapine 2.5 mg p.o. daily, olanzapine 10 mg p.o. nightly, sertraline 100 mg p.o. daily, and trazodone 100 mg p.o. nightly. Patient continues to receive benzodiazepine Ativan injection for agitation and at nighttime for sleep.  Past Psychiatric History: Notable for schizoaffective disorder and prior medication trials including Risperidone, Paliperidone, Quetiapine. Patient has been inpatient before, most recently this past year. She is unable to provide further history of her illness at this time.  Risk to Self:  yes Risk to Others:  no Prior Inpatient Therapy:  yes Prior Outpatient Therapy:   no  Past Medical History:  Past Medical History:  Diagnosis Date  . Borderline diabetes   . Depression   . Diabetes mellitus without complication (HCC)   . High cholesterol   . Hypertension   . Schizoaffective disorder (HCC)   . Scoliosis     Past Surgical History:  Procedure Laterality Date  . ABDOMINAL HYSTERECTOMY     Family History: No family history on file.  Social History:  Social History   Substance and Sexual Activity  Alcohol Use No     Social History   Substance and Sexual Activity  Drug Use No    Social History   Socioeconomic History  . Marital status: Single    Spouse name: Not on file  . Number of children: Not on file  . Years of education: Not on file  . Highest education level: Not on file  Occupational History  . Not on file  Tobacco Use  . Smoking status: Never Smoker  . Smokeless tobacco: Never Used  Substance and Sexual Activity  . Alcohol use: No  . Drug use: No  . Sexual activity: Not on file  Other Topics Concern  .  Not on file  Social History Narrative  . Not on file   Social Determinants of Health   Financial Resource Strain: Not on file  Food Insecurity: Not on file  Transportation Needs: Not on file  Physical Activity: Not on file  Stress: Not on file  Social Connections: Not on file   Additional Social History:     Allergies:   Allergies  Allergen Reactions  . Paliperidone     Other reaction(s): Hives / Skin Rash  . Quetiapine Fumarate Rash    Other reaction(s): Hives / Skin Rash  . Risperidone Rash    Other reaction(s): Hives / Skin Rash  . Diphenhydramine Hcl     Other reaction(s): Difficulty breathing, Hives / Skin Rash  . Latex Rash    Other reaction(s): Rash, Rash Other reaction(s): DERMATITIS Other reaction(s): DERMATITIS     Labs:  Results for orders placed or performed during the hospital encounter of 02/27/20 (from the past 48 hour(s))  Glucose, capillary     Status: Abnormal   Collection Time: 03/31/20  4:28 PM  Result Value Ref Range   Glucose-Capillary 119 (H) 70 - 99 mg/dL    Comment: Glucose reference range applies only to samples taken after fasting for at least 8 hours.  Glucose, capillary     Status: Abnormal   Collection Time: 03/31/20  9:16 PM  Result Value Ref Range   Glucose-Capillary 121 (H) 70 - 99 mg/dL    Comment: Glucose reference range applies only to samples taken after fasting for at least 8 hours.  Glucose, capillary     Status: Abnormal   Collection Time: 04/01/20  6:46 AM  Result Value Ref Range   Glucose-Capillary 137 (H) 70 - 99 mg/dL    Comment: Glucose reference range applies only to samples taken after fasting for at least 8 hours.  Glucose, capillary     Status: None   Collection Time: 04/01/20 12:23 PM  Result Value Ref Range   Glucose-Capillary 84 70 - 99 mg/dL    Comment: Glucose reference range applies only to samples taken after fasting for at least 8 hours.  Glucose, capillary     Status: Abnormal   Collection Time: 04/01/20  4:45 PM  Result Value Ref Range   Glucose-Capillary 122 (H) 70 - 99 mg/dL    Comment: Glucose reference range applies only to samples taken after fasting for at least 8 hours.  Glucose, capillary     Status: None   Collection Time: 04/01/20  8:53 PM  Result Value Ref Range   Glucose-Capillary 79 70 - 99 mg/dL     Comment: Glucose reference range applies only to samples taken after fasting for at least 8 hours.  Glucose, capillary     Status: Abnormal   Collection Time: 04/02/20  6:48 AM  Result Value Ref Range   Glucose-Capillary 114 (H) 70 - 99 mg/dL    Comment: Glucose reference range applies only to samples taken after fasting for at least 8 hours.  Glucose, capillary     Status: Abnormal   Collection Time: 04/02/20 11:40 AM  Result Value Ref Range   Glucose-Capillary 114 (H) 70 - 99 mg/dL    Comment: Glucose reference range applies only to samples taken after fasting for at least 8 hours.   Comment 1 Notify RN    Comment 2 Document in Chart     Current Facility-Administered Medications  Medication Dose Route Frequency Provider Last Rate Last Admin  . acetaminophen (TYLENOL) tablet  650 mg  650 mg Oral Q6H PRN Jacques NavyNorins, Michael E, MD   650 mg at 03/03/20 2156   Or  . acetaminophen (TYLENOL) suppository 650 mg  650 mg Rectal Q6H PRN Norins, Rosalyn GessMichael E, MD      . amantadine (SYMMETREL) 50 MG/5ML solution 100 mg  100 mg Oral BID Lodema Hongierce, Dwayne A, RPH   100 mg at 04/02/20 1208  . aspirin EC tablet 81 mg  81 mg Oral Daily Theotis BarrioLee, Joshua K, MD   81 mg at 04/02/20 1208  . atorvastatin (LIPITOR) tablet 40 mg  40 mg Oral Daily Norins, Rosalyn GessMichael E, MD   40 mg at 04/02/20 1209  . benztropine (COGENTIN) tablet 1 mg  1 mg Oral QHS Charm RingsLord, Jamison Y, NP   1 mg at 04/01/20 2117  . feeding supplement (ENSURE ENLIVE / ENSURE PLUS) liquid 237 mL  237 mL Oral TID BM Reymundo PollGuilloud, Carolyn, MD   237 mL at 04/02/20 1213  . heparin injection 5,000 Units  5,000 Units Subcutaneous Q8H Theotis BarrioLee, Joshua K, MD   5,000 Units at 04/02/20 0601  . insulin aspart (novoLOG) injection 0-15 Units  0-15 Units Subcutaneous TID WC Norins, Rosalyn GessMichael E, MD   2 Units at 04/01/20 1854  . lisinopril (ZESTRIL) tablet 10 mg  10 mg Oral Daily Dagar, Geralynn RileAnjali, MD   10 mg at 04/02/20 1211  . LORazepam (ATIVAN) tablet 0.25 mg  0.25 mg Oral QHS PRN Dagar, Geralynn RileAnjali,  MD   0.25 mg at 04/01/20 2118   Or  . LORazepam (ATIVAN) injection 0.25 mg  0.25 mg Intramuscular QHS PRN Dagar, Geralynn RileAnjali, MD      . melatonin tablet 3 mg  3 mg Oral QHS Dagar, Anjali, MD   3 mg at 04/01/20 2117  . multivitamin with minerals tablet 1 tablet  1 tablet Oral Daily Reymundo PollGuilloud, Carolyn, MD   1 tablet at 04/02/20 1209  . OLANZapine (ZYPREXA) tablet 10 mg  10 mg Oral QHS Dagar, Geralynn RileAnjali, MD   10 mg at 04/01/20 2120  . OLANZapine (ZYPREXA) tablet 2.5 mg  2.5 mg Oral Daily Charm RingsLord, Jamison Y, NP   2.5 mg at 04/02/20 1210  . pantoprazole (PROTONIX) EC tablet 40 mg  40 mg Oral Daily Theotis BarrioLee, Joshua K, MD   40 mg at 04/02/20 1208  . polyethylene glycol (MIRALAX / GLYCOLAX) packet 17 g  17 g Oral BID Verdene LennertBasaraba, Iulia, MD   17 g at 04/02/20 1209  . senna (SENOKOT) tablet 17.2 mg  2 tablet Oral BID Verdene LennertBasaraba, Iulia, MD   17.2 mg at 04/02/20 1208  . sertraline (ZOLOFT) tablet 100 mg  100 mg Oral Daily Maryagnes AmosStarkes-Perry, Dayla Gasca S, FNP   100 mg at 04/02/20 1208  . traZODone (DESYREL) tablet 100 mg  100 mg Oral QHS Dagar, Geralynn RileAnjali, MD   100 mg at 04/01/20 2119      Psychiatric Specialty Exam: Physical Exam Vitals and nursing note reviewed.  Constitutional:      Appearance: Normal appearance.  HENT:     Head: Normocephalic.     Nose: Nose normal.  Pulmonary:     Effort: Pulmonary effort is normal.  Musculoskeletal:     Cervical back: Normal range of motion.  Neurological:     Comments: Somnolent   Psychiatric:        Mood and Affect: Affect is not blunt.        Cognition and Memory: Cognition is impaired. Memory is impaired.        Judgment: Judgment is  not impulsive.     Review of Systems  All other systems reviewed and are negative.   Blood pressure (!) 165/45, pulse (!) 55, temperature 97.6 F (36.4 C), temperature source Oral, resp. rate 14, weight 74 kg, SpO2 96 %.Body mass index is 29.83 kg/m.  General Appearance: Fairly Groomed  Eye Contact:  UTA, asleep, would not awaken  Speech:  UTA   Volume:  UTA  Mood:  Resting quietly  Affect:  Blunted  Thought Process:  UTA  Orientation:  UTA  Thought Content:  Appear to be at her baseline  Suicidal Thoughts:  No  Homicidal Thoughts:  No  Memory:  UTA  Judgement:  UTA  Insight:  UTA  Psychomotor Activity:  Decreased  Concentration:  UTA  Recall:  UTA  Fund of Knowledge:  UTA  Language:  UTA  Akathisia:  No  Handed:  Right  AIMS (if indicated):     Assets:  Social Support  ADL's:  Impaired  Cognition:  ADL  Sleep:      Treatment Plan Summary:  64 year old woman with history of schizoaffective illness currently hospitalized after acute kidney injury, continuing to present with AMS, psychosis.  The patient's overall status is on the decline as her cognition is showing typical age related changes of patient with schizophrenia which compromise a dementia like picture. Therefore her safety in the home will continue to be question as this patient continues to age, especially as this patient has multiple co-morbidities and risk factors including insulin-dependent diabetes.   Schizophrenia: Continue Zyprexa to 2.5 mg in the am and continue zypreza 10mg  po QHS  Insomnia Continue QHS trazodone 100 mg  Continue with QHS melatonin 3 mg at bedtime -discontinuing lorazepam, as benzodiazepines are not recommended in this population.  -When appropriate encourage physical activity and increase mobility during the daytime to promote better sleep hygiene, as she continues to be asleep throughout the day versus at nighttime.   EPS: Continue Benztropine 1mg  QHS Continue Amantadine 50 mg BID  Depression: Continue with Sertraline 100mg   -Continue with delirium precautions.   Disposition: No evidence of imminent risk to self or others at present.   Patient does not meet criteria for psychiatric inpatient admission.  , FNP 04/02/2020 3:40 PM

## 2020-04-03 DIAGNOSIS — E440 Moderate protein-calorie malnutrition: Secondary | ICD-10-CM | POA: Insufficient documentation

## 2020-04-03 LAB — GLUCOSE, CAPILLARY
Glucose-Capillary: 111 mg/dL — ABNORMAL HIGH (ref 70–99)
Glucose-Capillary: 143 mg/dL — ABNORMAL HIGH (ref 70–99)
Glucose-Capillary: 139 mg/dL — ABNORMAL HIGH (ref 70–99)
Glucose-Capillary: 115 mg/dL — ABNORMAL HIGH (ref 70–99)

## 2020-04-03 MED ORDER — ENOXAPARIN SODIUM 40 MG/0.4ML ~~LOC~~ SOLN
40.0000 mg | SUBCUTANEOUS | Status: DC
  Administered 2020-04-03 – 2020-04-15 (×13): 40 mg via SUBCUTANEOUS
  Filled 2020-04-03 (×13): qty 0.4

## 2020-04-03 MED ORDER — WHITE PETROLATUM EX OINT
TOPICAL_OINTMENT | CUTANEOUS | Status: AC
  Filled 2020-04-03: qty 28.35

## 2020-04-03 NOTE — NC FL2 (Signed)
[place] MEDICAID FL2 LEVEL OF CARE SCREENING TOOL     IDENTIFICATION  Patient Name: Jessica Campbell Birthdate: 10-May-1955 Sex: female Admission Date (Current Location): 02/27/2020  [place] and [place] Number:  [facility] [id number] ([facility]) Facility and Address:  [facility], [address] [place], [place] [address]      Provider Number: [id number]  Attending Physician Name and Address:  Anne Shutter, MD  Relative Name and Phone Number:  Lindie Spruce - sister: [phone]    Current Level of Care: Hospital Recommended Level of Care: Skilled Nursing Facility Prior Approval Number:    Date Approved/Denied:   PASRR Number:  ([facility] WL#[id number] - PASRR pending)  Discharge Plan: SNF    Current Diagnoses: Patient Active Problem List   Diagnosis Date Noted  . Anemia   . Delirium   . AKI (acute kidney injury) (HCC) 02/28/2020  . DM (diabetes mellitus), type 2 (HCC) 02/28/2020  . HTN (hypertension), benign 02/28/2020  . Paranoid schizophrenia, chronic condition with acute exacerbation (HCC) 02/28/2020  . HLD (hyperlipidemia) 02/28/2020  . Acute kidney injury (HCC) 02/28/2020  . Hallucinations 02/28/2020    Orientation RESPIRATION BLADDER Height & Weight     Self  Normal Incontinent Weight: 167 lb (75.8 kg) Height:     BEHAVIORAL SYMPTOMS/MOOD NEUROLOGICAL BOWEL NUTRITION STATUS  Other (Comment) (Visual and auditory Hallucinations)   Continent Diet (Regular)  AMBULATORY STATUS COMMUNICATION OF NEEDS Skin   Extensive Assist Verbally Normal                       Personal Care Assistance Level of Assistance  Bathing,Feeding,Dressing Bathing Assistance: Maximum assistance Feeding assistance: Independent Dressing Assistance: Maximum assistance (Mod Assistance per OT)     Functional Limitations Info  Sight,Hearing,Speech Sight Info: Adequate Hearing Info: Adequate Speech Info: Adequate    SPECIAL CARE FACTORS  FREQUENCY  PT (By licensed PT),OT (By licensed OT)     PT Frequency: Evaluated 02/28/20 OT Frequency: Evaluated 11/26            Contractures Contractures Info: Not present    Additional Factors Info  Code Status,Allergies,Insulin Sliding Scale,Psychotropic Code Status Info: Full Allergies Info: Paliperidone, Quetiapine Fumarate, Risperidone, Diphenhydramine Hcl, Latex Psychotropic Info: amantadine 100 mg p.o. twice daily, Cogentin 1 mg p.o, nightly, olanzapine 2.5 mg p.o. daily, olanzapine 10 mg p.o. nightly, sertraline 100 mg p.o., daily, and trazodone 100 mg p.o. nightly Insulin Sliding Scale Info: 0-15 Units 3 times per day with meals       Current Medications (04/03/2020):  This is the current hospital active medication list Current Facility-Administered Medications  Medication Dose Route Frequency Provider Last Rate Last Admin  . acetaminophen (TYLENOL) tablet 650 mg  650 mg Oral Q6H PRN Jacques Navy, MD   650 mg at 03/03/20 2156   Or  . acetaminophen (TYLENOL) suppository 650 mg  650 mg Rectal Q6H PRN Norins, Rosalyn Gess, MD      . amantadine (SYMMETREL) 50 MG/5ML solution 100 mg  100 mg Oral BID Lodema Hong A, RPH   100 mg at 04/03/20 0949  . aspirin EC tablet 81 mg  81 mg Oral Daily Theotis Barrio, MD   81 mg at 04/03/20 0949  . atorvastatin (LIPITOR) tablet 40 mg  40 mg Oral Daily Norins, Rosalyn Gess, MD   40 mg at 04/03/20 0949  . benztropine (COGENTIN) tablet 1 mg  1 mg Oral QHS Charm Rings, NP  1 mg at 04/02/20 2236  . enoxaparin (LOVENOX) injection 40 mg  40 mg Subcutaneous Q24H Remo Lipps, MD   40 mg at 04/03/20 1217  . feeding supplement (ENSURE ENLIVE / ENSURE PLUS) liquid 237 mL  237 mL Oral TID BM Reymundo Poll, MD   237 mL at 04/03/20 0949  . insulin aspart (novoLOG) injection 0-15 Units  0-15 Units Subcutaneous TID WC Norins, Rosalyn Gess, MD   2 Units at 04/03/20 1218  . lisinopril (ZESTRIL) tablet 10 mg  10 mg Oral Daily Dagar, Geralynn Rile, MD   10 mg  at 04/03/20 0949  . melatonin tablet 3 mg  3 mg Oral QHS Dagar, Anjali, MD   3 mg at 04/02/20 2236  . multivitamin with minerals tablet 1 tablet  1 tablet Oral Daily Reymundo Poll, MD   1 tablet at 04/03/20 0949  . OLANZapine (ZYPREXA) tablet 10 mg  10 mg Oral QHS Dagar, Geralynn Rile, MD   10 mg at 04/02/20 2235  . OLANZapine (ZYPREXA) tablet 2.5 mg  2.5 mg Oral Daily Charm Rings, NP   2.5 mg at 04/03/20 0949  . pantoprazole (PROTONIX) EC tablet 40 mg  40 mg Oral Daily Theotis Barrio, MD   40 mg at 04/03/20 0949  . polyethylene glycol (MIRALAX / GLYCOLAX) packet 17 g  17 g Oral BID Verdene Lennert, MD   17 g at 04/03/20 0949  . senna (SENOKOT) tablet 17.2 mg  2 tablet Oral BID Verdene Lennert, MD   17.2 mg at 04/03/20 0949  . sertraline (ZOLOFT) tablet 100 mg  100 mg Oral Daily Maryagnes Amos, FNP   100 mg at 04/03/20 0949  . traZODone (DESYREL) tablet 100 mg  100 mg Oral QHS Dagar, Geralynn Rile, MD   100 mg at 04/02/20 2236     Discharge Medications: Please see discharge summary for a list of discharge medications.  Relevant Imaging Results:  Relevant Lab Results:   Additional Information ss#[id number]. Patient's payor is [facility] - she has [employer]. Patient in need of LTC SNF placement.  Cristobal Goldmann, LCSW

## 2020-04-03 NOTE — Progress Notes (Signed)
Nutrition Follow-up  DOCUMENTATION CODES:   Non-severe (moderate) malnutrition in context of chronic illness  INTERVENTION:   Continue Ensure Enlive/Plus po TID, each supplement provides 350 kcal and 20 grams of protein (Ensure Plus only has 13 grams of protein per serving)  Continue MVI daily  Continue Magic cup TID with meals, each supplement provides 290 kcal and 9 grams of protein  NUTRITION DIAGNOSIS:   Moderate Malnutrition related to chronic illness (schizoaffective disorder) as evidenced by mild fat depletion,moderate muscle depletion,mild muscle depletion.  updated  GOAL:   Patient will meet greater than or equal to 90% of their needs  progressing  MONITOR:   PO intake,Supplement acceptance,Labs,Weight trends,I & O's  REASON FOR ASSESSMENT:   LOS    ASSESSMENT:   Pt admitted with AKI (now resolved) and schizoaffective disorder. PMH includes schizoaffective disorder with paranoia, HTN, HLD, DM, and depression.  Pt sleeping at time of RD visit and did not wake to RD voice/touch. Per MD, pt stable for d/c once bed is available; pt now likely to d/c to SNF with outpatient psychiatric follow-up.   Meal completions charted as 10-100% x last 8 recorded meals. Per RN, pt's PO intake has improved over the last few days. Noted that pt has consumed 50-100% of all documented meals since 12/26. Pt has been receiving Ensure TID and Magic Cup TID. Pt doing well with supplements per RN.   No UOP documented.   Labs: CBGs 111-162 Medications:ss novolog TID w/ meals, mvi with minerals, miralax, senokot  NUTRITION - FOCUSED PHYSICAL EXAM:  Flowsheet Row Most Recent Value  Orbital Region Mild depletion  Upper Arm Region Mild depletion  Thoracic and Lumbar Region Mild depletion  Buccal Region Mild depletion  Temple Region Moderate depletion  Clavicle Bone Region Mild depletion  Clavicle and Acromion Bone Region Mild depletion  Scapular Bone Region Mild depletion  Dorsal  Hand Moderate depletion  Patellar Region Moderate depletion  Anterior Thigh Region Moderate depletion  Posterior Calf Region Severe depletion  Edema (RD Assessment) None  Hair Reviewed  Eyes Reviewed  Mouth Reviewed  Skin Reviewed  Nails Reviewed       Diet Order:   Diet Order            Diet regular Room service appropriate? Yes; Fluid consistency: Thin  Diet effective now                 EDUCATION NEEDS:   Not appropriate for education at this time  Skin:  Skin Assessment: Reviewed RN Assessment  Last BM:  12/27  Height:   Ht Readings from Last 1 Encounters:  02/09/18 5\' 2"  (1.575 m)    Weight:   Wt Readings from Last 1 Encounters:  04/02/20 75.8 kg    BMI:  Body mass index is 30.54 kg/m.  Estimated Nutritional Needs:   Kcal:  1700-1900  Protein:  85-95 grams  Fluid:  >/=1.7L/d    04/04/20, MS, RD, LDN RD pager number and weekend/on-call pager number located in [place].

## 2020-04-03 NOTE — TOC Progression Note (Addendum)
Transition of Care [facility]) - Progression Note    Patient Details  Name: Jessica Campbell MRN: [id number] Date of Birth: [birth year]  [facility] of [facility] CM/SW Contact  Okey Dupre Lazaro Arms, LCSW Phone Number: 04/03/2020, 3:03 PM  Clinical Narrative:  Patient no longer in need of psychiatric placement and SNF recommended for LTC as patient is [facility] participate and has Medicaid.  FL-2 completed and faxed out to 3 [facility] facilities: [facility], [place] and [facility] [facility]. Submitted for PASRR and requested clinicals will be faxed to [place] [facility] [place] [facility] once FL-2 signed by MD.  Requested clinicals faxed ti [place] [facility] for PASRR number.   .   Expected Discharge Plan and Services - SNF placement - LTC                                               Social Determinants of Health (SDOH) Interventions  Patient has mental health diagnosis and needs psychiatric follow-up with medications, etc.  Readmission Risk Interventions No flowsheet data found.

## 2020-04-03 NOTE — Progress Notes (Signed)
Physical Therapy Treatment Patient Details Name: Jessica Campbell MRN: [id number] DOB: 03-Sep-1955 Today's Date: 04/03/2020    History of Present Illness Eadie Bow is a 64 y.o. with PMH of schizoaffective disorder with paranoia, HTN, HLD, DM, depression admitted for hallucinations and durg induced extrapyramidal movement disorder.    PT Comments    Patient received in bed, family present and helped to encourage patient during session. Patient very happy to see therapist, but still in need of heavy physical assistance for all aspects of mobility today. Able to get to EOB and then practiced pre-gait and standing balance tasks including attempts at heel raises, standing marches, and weight shifting from heels to toes with max multimodal cues and varied success at each exercise. Fatigued quickly today. Still with multidirectional balance loss and heavy posterior lean, as well as ongoing cognitive/safety impairments. Left in bed positioned to comfort with family present, bed alarm active.     Follow Up Recommendations  SNF     Equipment Recommendations  Wheelchair cushion (measurements PT);Wheelchair (measurements PT);3in1 (PT)    Recommendations for Other Services       Precautions / Restrictions Precautions Precautions: Fall Precaution Comments: visual and auditory hallucinations, multidirectional and unpredictable balance loss Restrictions Weight Bearing Restrictions: No    Mobility  Bed Mobility Overal bed mobility: Needs Assistance Bed Mobility: Supine to Sit;Sit to Supine     Supine to sit: Mod assist Sit to supine: Mod assist   General bed mobility comments: ModA for trunk and BLE management, min-modA to help manage posterior lean sitting EOB  Transfers Overall transfer level: Needs assistance Equipment used: 1 person hand held assist Transfers: Sit to/from Stand Sit to Stand: Min assist         General transfer comment: MinA to power all the way up to  standing, then needed Mod-MaxA for gaining and maintaining balance in standing especially during pre-gait tasks  Ambulation/Gait             General Gait Details: unsafe- strong posterior lean and fatigued easily   Stairs             Wheelchair Mobility    Modified Rankin (Stroke Patients Only)       Balance Overall balance assessment: Needs assistance Sitting-balance support: Feet supported;Bilateral upper extremity supported Sitting balance-Leahy Scale: Poor Sitting balance - Comments: Min-ModA sitting EOB to maintain upright due to posterior lean Postural control: Posterior lean Standing balance support: Bilateral upper extremity supported;During functional activity Standing balance-Leahy Scale: Poor Standing balance comment: Mod-Max A for maintaining standing balance                            Cognition Arousal/Alertness: Awake/alert Behavior During Therapy: Impulsive;Restless Overall Cognitive Status: Impaired/Different from baseline Area of Impairment: Problem solving;Awareness;Safety/judgement;Following commands;Attention;Orientation;Memory                 Orientation Level: Disoriented to;Place;Situation Current Attention Level: Sustained Memory: Decreased recall of precautions;Decreased short-term memory Following Commands: Follows one step commands inconsistently;Follows multi-step commands inconsistently Safety/Judgement: Decreased awareness of safety;Decreased awareness of deficits Awareness: Intellectual Problem Solving: Slow processing;Requires verbal cues;Requires tactile cues;Difficulty sequencing General Comments: happy to see therapist and agreeable to therapy, still with ongoing poor attention/STM/poor awareness of deficits. Impulsive and restless with quite a bit of difficulty following simple multimodal cues      Exercises      General Comments        Pertinent Vitals/Pain Pain Assessment: Faces Faces  Pain Scale: No  hurt Pain Intervention(s): Limited activity within patient's tolerance;Monitored during session    Home Living                      Prior Function            PT Goals (current goals can now be found in the care plan section) Acute Rehab PT Goals Patient Stated Goal: none stated PT Goal Formulation: With patient Time For Goal Achievement: 04/17/20 Potential to Achieve Goals: Fair Progress towards PT goals: Progressing toward goals    Frequency    Min 2X/week      PT Plan Current plan remains appropriate    Co-evaluation              AM-PAC PT "6 Clicks" Mobility   Outcome Measure  Help needed turning from your back to your side while in a flat bed without using bedrails?: A Lot Help needed moving from lying on your back to sitting on the side of a flat bed without using bedrails?: A Lot Help needed moving to and from a bed to a chair (including a wheelchair)?: Total Help needed standing up from a chair using your arms (e.g., wheelchair or bedside chair)?: Total Help needed to walk in hospital room?: Total Help needed climbing 3-5 steps with a railing? : Total 6 Click Score: 8    End of Session Equipment Utilized During Treatment: Gait belt Activity Tolerance: Patient tolerated treatment well Patient left: in bed;with call bell/phone within reach;with bed alarm set;with family/visitor present Nurse Communication: Mobility status PT Visit Diagnosis: Unsteadiness on feet (R26.81);History of falling (Z91.81);Other abnormalities of gait and mobility (R26.89)     Time: 3244-0102 PT Time Calculation (min) (ACUTE ONLY): 15 min  Charges:  $Therapeutic Activity: 8-22 mins                     Madelaine Etienne, DPT, PN1   Supplemental Physical Therapist [facility]    Pager [phone] Acute Rehab Office [phone]

## 2020-04-03 NOTE — Progress Notes (Signed)
   Subjective:   Feeling cold and complaining of dry lips and a cough. Otherwise denies CP, SOB, or other symptoms. Discussed that we are waiting on SNF placement and patient expressed understanding.    Objective:  Vital signs in last 24 hours: Vitals:   04/02/20 1639 04/02/20 1900 04/02/20 2100 04/03/20 0430  BP: (!) 103/50  116/61 (!) 123/57  Pulse: (!) 59  67 68  Resp: 14   18  Temp: 98.4 F (36.9 C)  98.8 F (37.1 C) 98.3 F (36.8 C)  TempSrc:   Oral Oral  SpO2: 95%  96% 94%  Weight:  75.8 kg      Physical Exam Constitutional: no acute distress Head: atraumatic ENT: external ears normal Eyes: EOMI Cardiovascular: regular rate and rhythm, normal heart sounds Pulmonary: effort normal, lungs clear to ascultation bilaterally Abdominal: flat, nontender, no rebound tenderness, bowel sounds normal Skin: warm and dry Neurological: alert, no focal deficit Psychiatric: normal mood and affect. Does not seem to be responding to internal stimuli  Assessment/Plan: Jessica Campbell is a 64 y.o. female with hx of schizoaffective disorder with paranoia, HTN, HLD< DM, depression presenting with AKI which has improved and schizoaffective disorder, pending placement in geri psych vs SNF with close outpatient psych f/u.  Principal Problem:   Paranoid schizophrenia, chronic condition with acute exacerbation (HCC) Active Problems:   AKI (acute kidney injury) (HCC)   DM (diabetes mellitus), type 2 (HCC)   HTN (hypertension), benign   HLD (hyperlipidemia)   Acute kidney injury (HCC)   Hallucinations   Delirium   Anemia   #Schizoaffective disorder Patient no responding to internal stimuli at this time. Admits to auditory and visual hallucinations Denies suicidal or homicidal ideations. Has previously received Atian this admission for agitation and rested well afterwards. Psych is consulted. Jessica Campbell is a [facility] patient and after conversation with Dr. Regino Schultze, most amicable situation at  this point is her going to SNF with close outpatient follow up. Our SW and SW at [facility] are working towards it. No updates on placement yet.    - Appreciate psychiatry's recommendations  - Zoloft 100mg  daily - Trazodone 100 mg qHS -Zyprexa 2.5 mg in AM and 10 mgQHS - Melatonin 3 mg QHS - d/c Ativan per psych  # Drug-induced extrapyramidal movement disorder Currently resting comfort without overt movement disorder. - Amantadine 100 mg BID - Benztropine 0.5 mg BID   # AKI on CKD3a, improved  No creatinine in our system prior to admission in November. Presented with creatinine of 2.3 and has leveled off around 1.4, which may be her new baseline. - Avoid nephrotoxic agents  - Encourage PO intake  # Physical Deconditioning In the setting of prolonged hospitalization - Continue working with PT/OT   Diet:  regular IVF:  none VTE:  lovenox Prior to Admission Living Arrangement:  home Anticipated Discharge Location:  SNF Barriers to Discharge:  Medically cleared, pending plafcement Dispo: Anticipated discharge whenever placement is arranged.   December, MD 04/03/2020, 6:42 AM Pager: [phone] After 5pm on weekdays and 1pm on weekends: On Call pager [phone]

## 2020-04-04 DIAGNOSIS — I129 Hypertensive chronic kidney disease with stage 1 through stage 4 chronic kidney disease, or unspecified chronic kidney disease: Secondary | ICD-10-CM | POA: Diagnosis not present

## 2020-04-04 DIAGNOSIS — N1831 Chronic kidney disease, stage 3a: Secondary | ICD-10-CM | POA: Diagnosis not present

## 2020-04-04 DIAGNOSIS — N179 Acute kidney failure, unspecified: Secondary | ICD-10-CM | POA: Diagnosis not present

## 2020-04-04 DIAGNOSIS — F200 Paranoid schizophrenia: Secondary | ICD-10-CM | POA: Diagnosis not present

## 2020-04-04 LAB — SODIUM, URINE, RANDOM: Sodium, Ur: 41 mmol/L

## 2020-04-04 LAB — CBC
HCT: 32.2 % — ABNORMAL LOW (ref 36.0–46.0)
Hemoglobin: 9.9 g/dL — ABNORMAL LOW (ref 12.0–15.0)
MCH: 27.7 pg (ref 26.0–34.0)
MCHC: 30.7 g/dL (ref 30.0–36.0)
MCV: 89.9 fL (ref 80.0–100.0)
Platelets: 249 10*3/uL (ref 150–400)
RBC: 3.58 MIL/uL — ABNORMAL LOW (ref 3.87–5.11)
RDW: 13.8 % (ref 11.5–15.5)
WBC: 7.5 10*3/uL (ref 4.0–10.5)
nRBC: 0 % (ref 0.0–0.2)

## 2020-04-04 LAB — BASIC METABOLIC PANEL
Anion gap: 8 (ref 5–15)
BUN: 31 mg/dL — ABNORMAL HIGH (ref 8–23)
CO2: 25 mmol/L (ref 22–32)
Calcium: 9.6 mg/dL (ref 8.9–10.3)
Chloride: 104 mmol/L (ref 98–111)
Creatinine, Ser: 1.73 mg/dL — ABNORMAL HIGH (ref 0.44–1.00)
GFR, Estimated: 33 mL/min — ABNORMAL LOW (ref >60)
Glucose, Bld: 202 mg/dL — ABNORMAL HIGH (ref 70–99)
Potassium: 4.5 mmol/L (ref 3.5–5.1)
Sodium: 137 mmol/L (ref 135–145)

## 2020-04-04 LAB — GLUCOSE, CAPILLARY
Glucose-Capillary: 139 mg/dL — ABNORMAL HIGH (ref 70–99)
Glucose-Capillary: 131 mg/dL — ABNORMAL HIGH (ref 70–99)
Glucose-Capillary: 136 mg/dL — ABNORMAL HIGH (ref 70–99)
Glucose-Capillary: 139 mg/dL — ABNORMAL HIGH (ref 70–99)

## 2020-04-04 LAB — CREATININE, URINE, RANDOM: Creatinine, Urine: 69.21 mg/dL

## 2020-04-04 MED ORDER — LACTATED RINGERS IV SOLN: INTRAVENOUS | Status: AC

## 2020-04-04 NOTE — Progress Notes (Signed)
   Subjective:   Jessica Campbell states that she feels well today, without pain, troubles urinating, dysuria, or any other concerns. Endorses seeing her mother and father and brother but knows they are hallucinations. She is not hearing any voices and does not feel bothered by this. Denies SI, HI. Understands that she should try to drink more water.  Objective:  Vital signs in last 24 hours: Vitals:   04/03/20 1008 04/03/20 1646 04/03/20 2113 04/04/20 0438  BP: (!) 145/98 95/76 (!) 136/106 (!) 111/55  Pulse: 64 63 76 (!) 58  Resp: 18 17 17    Temp: 97.9 F (36.6 C) 98.2 F (36.8 C) 98.3 F (36.8 C) 97.9 F (36.6 C)  TempSrc: Oral   Oral  SpO2: 99% 95% 96% 98%  Weight:   74.8 kg     Physical Exam Constitutional: no acute distress Head: atraumatic ENT: external ears normal Eyes: EOMI Cardiovascular: regular rate and rhythm, normal heart sounds Pulmonary: effort normal, lungs clear to ascultation bilaterally Abdominal: flat, nontender, no rebound tenderness, bowel sounds normal Skin: warm and dry Neurological: alert, no focal deficit Psychiatric: normal mood and affect. Does not seem to be responding to internal stimuli.  Endorses visual hallucinations.  Denies SI or HI.   Assessment/Plan: Jessica Campbell is a 63 y.o. female with hx of schizoaffective disorder with paranoia, HTN, HLD, DM, depression presenting with AKI which has improved and schizoaffective disorder, pending placement in geri psych vs SNF with close outpatient psych f/u.  Principal Problem:   Paranoid schizophrenia, chronic condition with acute exacerbation (HCC) Active Problems:   AKI (acute kidney injury) (HCC)   DM (diabetes mellitus), type 2 (HCC)   HTN (hypertension), benign   HLD (hyperlipidemia)   Acute kidney injury (HCC)   Hallucinations   Delirium   Anemia   Malnutrition of moderate degree   #Schizoaffective disorder Patient no responding to internal stimuli at this time. Admits to visual  hallucinations but they are not distressing to her, denies suicidal or homicidal ideations.   Is now resting well overnight. Psych is consulted. Jessica Campbell is a [facility] patient and after conversation with Dr. Roxan Hockey, most amicable situation at this point is her going to SNF with close outpatient follow up. Our SW and SW at [facility] are working towards it.   - Appreciate psychiatry's recommendations  - Zoloft 100mg  daily - Trazodone 100 mg qHS -Zyprexa 2.5 mg in AM and 10 mgQHS - Melatonin 3 mg QHS  # Drug-induced extrapyramidal movement disorder Currently resting comfort without overt movement disorder. - Amantadine 100 mg BID - Benztropine 0.5 mg BID   # AKI on CKD3a No creatinine in our system prior to admission in November. Presented with creatinine of 2.3 and has leveled off around 1.4, which may be her new baseline. Elevated to 1.73 this morning. No clear nephrotoxic agents except lisinopril, appears to have good PO intake. - Avoid nephrotoxic agents  - Encourage PO intake - check FENa  # Physical Deconditioning In the setting of prolonged hospitalization - Continue working with PT/OT   Diet:  regular IVF:  none VTE:  lovenox Prior to Admission Living Arrangement:  home Anticipated Discharge Location:  SNF Barriers to Discharge:  Medically cleared, pending plafcement Dispo: Anticipated discharge whenever placement is arranged.   03-21-2006, MD 04/04/2020, 6:03 AM Pager: [phone] After 5pm on weekdays and 1pm on weekends: On Call pager [phone]

## 2020-04-04 NOTE — Plan of Care (Signed)
  Problem: Safety: Goal: Ability to remain free from injury will improve Outcome: Progressing   

## 2020-04-04 NOTE — Progress Notes (Signed)
Occupational Therapy Treatment Patient Details Name: Jessica Campbell MRN: [id number] DOB: [birth year] Today's Date: 04/04/2020    History of present illness Jessica Campbell is a 64 y.o. with PMH of schizoaffective disorder with paranoia, HTN, HLD, DM, depression admitted for hallucinations and durg induced extrapyramidal movement disorder.   OT comments  OT treatment session with focus on bed mobility, static and dynamic sitting balance with focus on reaching outside of [facility], [employer] and functional cognition. Patient able to transition for supine to EOB with Min A to maintain balance at trunk only. Patient initially requiring assist 2/2 strong posterior lean but able to correct with cueing progressing to Min guard. Patient given 2-step task of turning styrofoam cups right-side up and placing a spoon in each. Patient unable to immediately recall instructions despite maximal cueing and repeat reminders. With max cues, patient able to complete task. Noted increased tremor in RUE with sister present at bedside stating "it comes and goes". Patient with difficulty completing functional tasks this date frequently dropping items. Patient able to return to supine with Min guard. Bed positioned in lowest setting with alarm set. Sister present at bedside at conclusion of session.    Follow Up Recommendations  SNF    Equipment Recommendations  3 in 1 bedside commode;Other (comment)    Recommendations for Other Services      Precautions / Restrictions Precautions Precautions: Fall Precaution Comments: Visual and auditory hallucinations, multidirectional and unpredictable balance loss. No active hallucinations this date. Restrictions Weight Bearing Restrictions: No       Mobility Bed Mobility Overal bed mobility: Needs Assistance Bed Mobility: Supine to Sit;Sit to Supine     Supine to sit: Min assist Sit to supine: Min guard   General bed mobility comments: Patient able to transition for supine to  EOB with cues to maintain balance at trunk only. Patient able to assist trunk to bed level and bring BLE from EOB to bed surface without external assist.  Transfers                      Balance Overall balance assessment: Needs assistance Sitting-balance support: Feet supported;Bilateral upper extremity supported Sitting balance-Leahy Scale: Fair Sitting balance - Comments: Patient initially requiring Min A to maintain sitting balance but progressed to Min guard during functional activity.                                   ADL either performed or assessed with clinical judgement   ADL                                               Vision       Perception     Praxis      Cognition Arousal/Alertness: Awake/alert Behavior During Therapy: Impulsive;Restless Overall Cognitive Status: Impaired/Different from baseline Area of Impairment: Problem solving;Awareness;Safety/judgement;Following commands;Attention;Orientation;Memory                 Orientation Level: Disoriented to;Place;Situation;Time Current Attention Level: Sustained Memory: Decreased recall of precautions;Decreased short-term memory Following Commands: Follows one step commands inconsistently;Follows one step commands with increased time Safety/Judgement: Decreased awareness of safety;Decreased awareness of deficits Awareness: Intellectual Problem Solving: Slow processing;Requires verbal cues;Requires tactile cues;Difficulty sequencing General Comments: Patient oriented to person and place with multi-choice cues. Reorientation to  date and situation.        Exercises     Shoulder Instructions       General Comments Sitting balance seated EOB during functional activity.    Pertinent Vitals/ Pain       Pain Assessment: No/denies pain  Home Living                                          Prior Functioning/Environment               Frequency  Min 2X/week        Progress Toward Goals  OT Goals(current goals can now be found in the care plan section)  Progress towards OT goals: Progressing toward goals  Acute Rehab OT Goals Patient Stated Goal: To sit in recliner. OT Goal Formulation: With patient/family Potential to Achieve Goals: Good ADL Goals Pt Will Perform Lower Body Dressing: sit to/from stand;with supervision Pt Will Perform Toileting - Clothing Manipulation and hygiene: sit to/from stand;with supervision Pt Will Perform Tub/Shower Transfer: grab bars Additional ADL Goal #1: pt will demonstrate 2 step command 2 out 4 attempts  Plan Discharge plan remains appropriate;Frequency remains appropriate    Co-evaluation                 AM-PAC OT "6 Clicks" Daily Activity     Outcome Measure   Help from another person eating meals?: A Little Help from another person taking care of personal grooming?: A Lot Help from another person toileting, which includes using toliet, bedpan, or urinal?: Total Help from another person bathing (including washing, rinsing, drying)?: Total Help from another person to put on and taking off regular upper body clothing?: A Lot Help from another person to put on and taking off regular lower body clothing?: A Lot 6 Click Score: 11    End of Session    OT Visit Diagnosis: Unsteadiness on feet (R26.81);History of falling (Z91.81);Other symptoms and signs involving cognitive function   Activity Tolerance Patient tolerated treatment well   Patient Left in bed;with call bell/phone within reach;with bed alarm set;with family/visitor present   Nurse Communication Other (comment) (Need for recliner)        Time: 5643-3295 OT Time Calculation (min): 19 min  Charges: OT General Charges $OT Visit: 1 Visit OT Treatments $Therapeutic Activity: 8-22 mins  Trampus Mcquerry H. OTR/L Supplemental OT, Department of rehab services [phone]   Kriss Ishler R H. 04/04/2020,  3:18 PM

## 2020-04-04 NOTE — TOC Progression Note (Addendum)
Transition of Care [facility]) - Progression Note    Patient Details  Name: Jessica Campbell MRN: [id number] Date of Birth: 05-26-1955  Transition of [facility]) CM/SW Contact  Okey Dupre Lazaro Arms, LCSW Phone Number: 04/04/2020, 10:08 AM  Clinical Narrative:  CSW received call from [facility] with [facility] (10:02 am) informing CSW that patient's clinicals reviewed and she is not appropriate for [facility] level of care.    Psychiatry recommendation changed from psychiatric hospital to SNF.  NOTE: Application sent to [facility] before patient's d/c disposition changed to SNF  Patient information was sent out to the 3 [facility] SNF's on 12/28 and no responses. Call made to [facility], admissions director at [facility] today regarding patient.and later was informed by Lowella Bandy that she is in contact with [facility] to see if she can get some things worked out before accepting patient. Talked with Marylene Land, [facility] SW and update provided.       Expected Discharge Plan and Services - Skilled Nursing Facility - LTC                                               Social Determinants of Health (SDOH) Interventions  Patient has mental health diagnosis  Readmission Risk Interventions No flowsheet data found.

## 2020-04-05 LAB — BASIC METABOLIC PANEL
Anion gap: 10 (ref 5–15)
Anion gap: 9 (ref 5–15)
BUN: 34 mg/dL — ABNORMAL HIGH (ref 8–23)
BUN: 35 mg/dL — ABNORMAL HIGH (ref 8–23)
CO2: 22 mmol/L (ref 22–32)
CO2: 26 mmol/L (ref 22–32)
Calcium: 9.4 mg/dL (ref 8.9–10.3)
Calcium: 9.5 mg/dL (ref 8.9–10.3)
Chloride: 104 mmol/L (ref 98–111)
Chloride: 100 mmol/L (ref 98–111)
Creatinine, Ser: 1.94 mg/dL — ABNORMAL HIGH (ref 0.44–1.00)
Creatinine, Ser: 1.86 mg/dL — ABNORMAL HIGH (ref 0.44–1.00)
GFR, Estimated: 29 mL/min — ABNORMAL LOW (ref >60)
GFR, Estimated: 30 mL/min — ABNORMAL LOW (ref >60)
Glucose, Bld: 160 mg/dL — ABNORMAL HIGH (ref 70–99)
Glucose, Bld: 150 mg/dL — ABNORMAL HIGH (ref 70–99)
Potassium: 5 mmol/L (ref 3.5–5.1)
Potassium: 4.7 mmol/L (ref 3.5–5.1)
Sodium: 136 mmol/L (ref 135–145)
Sodium: 135 mmol/L (ref 135–145)

## 2020-04-05 LAB — GLUCOSE, CAPILLARY
Glucose-Capillary: 110 mg/dL — ABNORMAL HIGH (ref 70–99)
Glucose-Capillary: 149 mg/dL — ABNORMAL HIGH (ref 70–99)
Glucose-Capillary: 175 mg/dL — ABNORMAL HIGH (ref 70–99)
Glucose-Capillary: 147 mg/dL — ABNORMAL HIGH (ref 70–99)

## 2020-04-05 MED ORDER — LACTATED RINGERS IV SOLN: INTRAVENOUS | Status: AC

## 2020-04-05 NOTE — Plan of Care (Signed)
  Problem: Safety: Goal: Ability to remain free from injury will improve Outcome: Progressing   

## 2020-04-05 NOTE — Progress Notes (Addendum)
Subjective:   Patient appears more alert today than she has been.  Kinetic tremors noted, it is a first time I have seen him.  She states that she feels well denies any particular complaints.  Continues to have visual hallucinations, but they do not disturb her and did not have an auditory component.  Denies chest pain, shortness of breath, or pain elsewhere.  Objective:  Vital signs in last 24 hours: Vitals:   04/04/20 0849 04/04/20 1736 04/04/20 2058 04/05/20 0456  BP: 98/84 (!) 151/126 129/74 128/80  Pulse: 66 (!) 57 (!) 58 (!) 58  Resp: 18 20 18 18   Temp: 98.4 F (36.9 C) 99 F (37.2 C) 98 F (36.7 C) 98 F (36.7 C)  TempSrc:  Oral  Oral  SpO2: 97% 95% 97% 98%  Weight:        Physical Exam Constitutional: no acute distress, somewhat restless Head: atraumatic ENT: external ears normal Eyes: EOMI Cardiovascular: regular rate and rhythm, normal heart sounds Pulmonary: effort normal, lungs clear to ascultation bilaterally Abdominal: flat, nontender, no rebound tenderness, bowel sounds normal Skin: warm and dry Neurological: alert, no focal deficit, moderate kinetic tremor of bilateral upper extremities Psychiatric: normal mood and affect. Does not seem to be responding to internal stimuli.  Endorses visual hallucinations.  Denies SI or HI.   Assessment/Plan: Jessica Campbell is a 64 y.o. female with hx of schizoaffective disorder with paranoia, HTN, HLD, DM, depression presenting with AKI which has improved and schizoaffective disorder, pending placement in geri psych vs SNF with close outpatient psych f/u.  Principal Problem:   Paranoid schizophrenia, chronic condition with acute exacerbation (HCC) Active Problems:   AKI (acute kidney injury) (HCC)   DM (diabetes mellitus), type 2 (HCC)   HTN (hypertension), benign   HLD (hyperlipidemia)   Acute kidney injury (HCC)   Hallucinations   Delirium   Anemia   Malnutrition of moderate degree   #Schizoaffective  disorder Patient not responding to internal stimuli at this time. Admits to visual hallucinations but they are not distressing to her, denies suicidal or homicidal ideations. Reports having good rest overnight, but nurse says she is only sleeping a few hours. Psych is consulted. Jessica Campbell is a [facility] patient and after conversation with Dr. Roxan Hockey, most amicable situation at this point is her going to SNF with close outpatient follow up. Our SW and SW at [facility] are working towards it.   - Appreciate psychiatry's recommendations  - Zoloft 100mg  daily - Trazodone 100 mg qHS -Zyprexa 2.5 mg in AM and 10 mgQHS - Melatonin 3 mg QHS  # Drug-induced extrapyramidal movement disorder Currently resting comfortably, but has increased kinetic tremor today. - Amantadine 100 mg BID - Benztropine 0.5 mg BID   # AKI on CKD3a Creatinine of 1.94 < 1.73. FENa 0.6% suggesting pre-renal etiology and fluids administered starting yeserday. She has had adequate oral intake, so unsure why she is having pre-renal issue. Presented with creatinine of 2.3 and has leveled off around 1.4, which may be her new baseline.  - discontinue lisinopril - Avoid nephrotoxic agents  - Encourage PO intake - IV fluids  # Physical Deconditioning In the setting of prolonged hospitalization - Continue working with PT/OT   Diet:  regular IVF:  none VTE:  lovenox Prior to Admission Living Arrangement:  home Anticipated Discharge Location:  SNF Barriers to Discharge:  Kidney function Dispo: Anticipated discharge whenever placement is arranged.   [facility], MD 04/05/2020, 6:57 AM Pager: [phone] After  5pm on weekdays and 1pm on weekends: On Call pager [phone]

## 2020-04-05 NOTE — Plan of Care (Signed)
  Problem: Nutrition: Goal: Adequate nutrition will be maintained Outcome: Progressing   

## 2020-04-05 NOTE — TOC Progression Note (Addendum)
Transition of Care [facility]) - Progression Note    Patient Details  Name: Jessica Campbell MRN: [id number] Date of Birth: 1955/11/24  [facility]) CM/SW Contact  Okey Dupre Lazaro Arms, LCSW Phone Number: 04/05/2020, 2:43 PM  Clinical Narrative:   CSW advised by Lowella Bandy that they can take patient today and asked that the family contact her. Talked with Ms. Earlene Plater, [phone] and asked that she contact admissions director at [facility] and contact information provided.    MD contacted and CSW informed that patient may be medically stable for discharge today. Contact made with Lowella Bandy at [facility] and info relayed that patient may not be ready for discharge today.  11:49 am: Received call from [place] with [facility] and was advised that family is requesting a bed alarm for patient, and they do not use bed alarms. Per Lowella Bandy, she will talk with Marylene Land with [facility] regarding this request. Received another call from [facility] (12:19) that she will not be able to accept patient as they cannot meet her needs, in terms of the bed alarm.  12:21 pm: Talked with Marylene Land, SW at [facility] regarding the family's request and was advised that the family had threatened to sue [employer]. Informed Marylene Land that calls had been made to [facility] and [employer] re: bed alarms and messages left.   3:01 pm: Call made to family member Lindie Spruce [phone]) and informed her that calls were made to [facility] and [facility] and waiting to hear back from them regarding the family's request for a bed alarm.       Expected Discharge Plan and Services - SNF placement                                               Social Determinants of Health (SDOH) Interventions  None needed or requested at this time  Readmission Risk Interventions No flowsheet data found.

## 2020-04-06 DIAGNOSIS — F200 Paranoid schizophrenia: Secondary | ICD-10-CM | POA: Diagnosis not present

## 2020-04-06 LAB — GLUCOSE, CAPILLARY
Glucose-Capillary: 125 mg/dL — ABNORMAL HIGH (ref 70–99)
Glucose-Capillary: 116 mg/dL — ABNORMAL HIGH (ref 70–99)
Glucose-Capillary: 99 mg/dL (ref 70–99)
Glucose-Capillary: 188 mg/dL — ABNORMAL HIGH (ref 70–99)

## 2020-04-06 LAB — CBC
HCT: 29.3 % — ABNORMAL LOW (ref 36.0–46.0)
Hemoglobin: 9.1 g/dL — ABNORMAL LOW (ref 12.0–15.0)
MCH: 27.5 pg (ref 26.0–34.0)
MCHC: 31.1 g/dL (ref 30.0–36.0)
MCV: 88.5 fL (ref 80.0–100.0)
Platelets: 238 10*3/uL (ref 150–400)
RBC: 3.31 MIL/uL — ABNORMAL LOW (ref 3.87–5.11)
RDW: 13.5 % (ref 11.5–15.5)
WBC: 8.3 10*3/uL (ref 4.0–10.5)
nRBC: 0 % (ref 0.0–0.2)

## 2020-04-06 LAB — BASIC METABOLIC PANEL
Anion gap: 11 (ref 5–15)
BUN: 35 mg/dL — ABNORMAL HIGH (ref 8–23)
CO2: 23 mmol/L (ref 22–32)
Calcium: 9.6 mg/dL (ref 8.9–10.3)
Chloride: 103 mmol/L (ref 98–111)
Creatinine, Ser: 1.76 mg/dL — ABNORMAL HIGH (ref 0.44–1.00)
GFR, Estimated: 32 mL/min — ABNORMAL LOW (ref >60)
Glucose, Bld: 135 mg/dL — ABNORMAL HIGH (ref 70–99)
Potassium: 4.2 mmol/L (ref 3.5–5.1)
Sodium: 137 mmol/L (ref 135–145)

## 2020-04-06 MED ORDER — BENZTROPINE MESYLATE 2 MG PO TABS
2.0000 mg | ORAL_TABLET | Freq: Every day | ORAL | Status: DC
  Administered 2020-04-06: 2 mg via ORAL
  Filled 2020-04-06 (×2): qty 1

## 2020-04-06 MED ORDER — LACTATED RINGERS IV SOLN: INTRAVENOUS | Status: AC

## 2020-04-06 MED ORDER — LORAZEPAM 1 MG PO TABS
1.0000 mg | ORAL_TABLET | Freq: Every evening | ORAL | Status: DC | PRN
  Administered 2020-04-06 – 2020-04-11 (×2): 2 mg via ORAL
  Administered 2020-04-11 – 2020-04-12 (×2): 1 mg via ORAL
  Filled 2020-04-06: qty 1
  Filled 2020-04-06 (×3): qty 2

## 2020-04-06 MED ORDER — FLUPHENAZINE HCL 5 MG PO TABS
10.0000 mg | ORAL_TABLET | Freq: Every day | ORAL | Status: DC
  Administered 2020-04-07: 10 mg via ORAL
  Filled 2020-04-06: qty 2
  Filled 2020-04-06: qty 1
  Filled 2020-04-06: qty 2
  Filled 2020-04-06: qty 1

## 2020-04-06 NOTE — Progress Notes (Signed)
Paged regarding psych has seen pt and has put in notes regarding.

## 2020-04-06 NOTE — Progress Notes (Signed)
Physical Therapy Treatment Patient Details Name: Jessica Campbell MRN: [id number] DOB: January 08, 1956 Today's Date: 04/06/2020    History of Present Illness Jessica Campbell is a 64 y.o. with PMH of schizoaffective disorder with paranoia, HTN, HLD, DM, depression admitted for hallucinations and durg induced extrapyramidal movement disorder.    PT Comments    Pt supine in reclined chair with back of chair lowered.  Pt required assistance to stand and use of stedy frame to improve stability in standing in a more controlled environment.  Pt continues to benefit from snf placement at d/c.      Follow Up Recommendations  SNF     Equipment Recommendations  Wheelchair cushion (measurements PT);Wheelchair (measurements PT);3in1 (PT) (tilt in space WC will be needed if symptoms do not improve.)    Recommendations for Other Services       Precautions / Restrictions Precautions Precautions: Fall Precaution Comments: Visual and auditory hallucinations, multidirectional and unpredictable balance loss. No active hallucinations this date.    Mobility  Bed Mobility Overal bed mobility: Needs Assistance Bed Mobility: Supine to Sit           General bed mobility comments: Mod assistance to move upper trunk away from back of recliner.  Transfers Overall transfer level: Needs assistance Equipment used: Ambulation equipment used (sara stedy) Transfers: Sit to/from Stand Sit to Stand: Min assist         General transfer comment: Min assistance to power into standing, mod assistance to maintain standing and keep hand holding to stedy bar.  Pt with posterior bias and performed re-education to shirt weight anteriorly.  Performed multiple standing trials with reps of sit  Ambulation/Gait Ambulation/Gait assistance:  (Performed standing pre gt with weight shifing and stepping in place in sara stedy frame.)               Stairs             Wheelchair Mobility    Modified Rankin  (Stroke Patients Only)       Balance Overall balance assessment: Needs assistance Sitting-balance support: Feet supported;Bilateral upper extremity supported Sitting balance-Leahy Scale: Fair Sitting balance - Comments: Patient initially requiring Min A to maintain sitting balance but progressed to Min guard during functional activity. Postural control: Posterior lean Standing balance support: Bilateral upper extremity supported;During functional activity Standing balance-Leahy Scale: Poor Standing balance comment: Mod-Max A for maintaining standing balance                            Cognition Arousal/Alertness: Awake/alert Behavior During Therapy: Impulsive;Restless Overall Cognitive Status: Impaired/Different from baseline Area of Impairment: Problem solving;Awareness;Safety/judgement;Following commands;Attention;Orientation;Memory                 Orientation Level: Disoriented to;Place;Situation;Time Current Attention Level: Sustained Memory: Decreased recall of precautions;Decreased short-term memory Following Commands: Follows one step commands inconsistently;Follows one step commands with increased time Safety/Judgement: Decreased awareness of safety;Decreased awareness of deficits Awareness: Intellectual Problem Solving: Slow processing;Requires verbal cues;Requires tactile cues;Difficulty sequencing General Comments: Patient oriented to person and place with multi-choice cues. Reorientation to date and situation.      Exercises General Exercises - Lower Extremity Heel Slides: AROM;Both;10 reps;Supine    General Comments        Pertinent Vitals/Pain Pain Assessment: No/denies pain    Home Living                      Prior Function  PT Goals (current goals can now be found in the care plan section) Acute Rehab PT Goals Patient Stated Goal: To sit in recliner. Potential to Achieve Goals: Fair Progress towards PT goals:  Progressing toward goals    Frequency    Min 2X/week      PT Plan Current plan remains appropriate    Co-evaluation              AM-PAC PT "6 Clicks" Mobility   Outcome Measure  Help needed turning from your back to your side while in a flat bed without using bedrails?: A Lot Help needed moving from lying on your back to sitting on the side of a flat bed without using bedrails?: A Lot Help needed moving to and from a bed to a chair (including a wheelchair)?: A Lot Help needed standing up from a chair using your arms (e.g., wheelchair or bedside chair)?: A Little Help needed to walk in hospital room?: Total Help needed climbing 3-5 steps with a railing? : Total 6 Click Score: 11    End of Session Equipment Utilized During Treatment: Gait belt Activity Tolerance: Patient tolerated treatment well Patient left: in bed;with call bell/phone within reach;with bed alarm set;with family/visitor present Nurse Communication: Mobility status       Time: 3976-7341 PT Time Calculation (min) (ACUTE ONLY): 26 min  Charges:  $Therapeutic Activity: 23-37 mins                     Jessica Campbell , PTA Acute Rehabilitation Services Pager [phone] Office [phone]     Jessica Campbell 04/06/2020, 12:58 PM

## 2020-04-06 NOTE — Plan of Care (Signed)
  Problem: Elimination: Goal: Will not experience complications related to urinary retention Outcome: Progressing   Problem: Nutrition: Goal: Adequate nutrition will be maintained Outcome: Not Progressing   

## 2020-04-06 NOTE — TOC Progression Note (Signed)
Transition of Care [facility]) - Progression Note    Patient Details  Name: Jessica Campbell MRN: [id number] Date of Birth: 28-Aug-1955  Transition of Care [facility]) CM/SW Contact  Okey Dupre Lazaro Arms, LCSW Phone Number: 04/06/2020, 1:34 PM LT Clinical Narrative:  Patient no longer in need of psychiatric placement and looking for long-term placement. [facility] unable to accept patient as they do no use bed alarms. Calls made to other 2 PACE facilities: [facility] and [facility]. Was advised by person answering phone that admissions person Wilfred Lacy and staff person Darel Hong not working today.  Call made to [facility], admissions director at [facility] and she is off today and will return to work on Monday.  CSW will f/u with both facilities on Monday.        Expected Discharge Plan and Services                                                 Social Determinants of Health (SDOH) Interventions  Patient in need of LTC placement  Readmission Risk Interventions No flowsheet data found.

## 2020-04-06 NOTE — Consult Note (Signed)
[facility] Face-to-Face Psychiatry Consult   Reason for Consult: Tremors, EPS Referring Physician: Dr. Para March Patient Identification: Jessica Campbell MRN:  [id number] Principal Diagnosis: Paranoid schizophrenia, chronic condition with acute exacerbation (HCC) Diagnosis:    Total Time spent with patient: 20 minutes  Subjective:   Jessica Campbell is a 64 y.o. female patient admitted with acute kidney injury.  Patient has history of schizophrenia.  She has been seen by consultation liaison services periodically.  Consult was called because patient is having acute exacerbation tremors, shakes.  HPI: Patient is evaluated, chart reviewed and spoke to her Sister Kenney Houseman who is present in the room.  I also spoke to her father sister Renold Don on the phone who is also involved in patient treatment plan.  It is on olanzapine 12.5 mg daily dose family noticed increase in her tremors, shakes and restlessness.  Patient is a poor historian and sometimes she is babbling and her speech is not clear.  She has a history of auditory and visual hallucination.  As per family patient is still have visual hallucination and see her deceased mother sometimes.  Patient admitted some time hearing voices but did not specify the detail.  She is not agitated or angry.  However she does appear restless and easily irritable.  She is also taking Zoloft, trazodone, benztropine.  Her sister reported that patient was admitted few months ago at old [facility] for psychosis and discharge on once a month injection.  Sister believes that she did very well on the injection but started missing the dosage.  She do not recall the name of the injection.  However sister recall history of tremors and TDs and given Ingrezza to help the tremors.  At this time patient denies any suicidal thoughts or any homicidal thoughts.  Patient's mother died 01-Feb-2021and since then she has been more sad and decompensated.  She used to live by herself but now  she is living with her sister.  Past Psychiatric History: Reviewed.  Chart she has given Prolixin, Risperdal, Invega, Seroquel.  However it is unclear which medicine helped her the most.  She has a history of inpatient but no history of suicidal attempt.  Her last inpatient was at old [facility].  Risk to Self:   Risk to Others:   Prior Inpatient Therapy:   Prior Outpatient Therapy:    Past Medical History:  Past Medical History:  Diagnosis Date  . Borderline diabetes   . Depression   . Diabetes mellitus without complication (HCC)   . High cholesterol   . Hypertension   . Schizoaffective disorder (HCC)   . Scoliosis     Past Surgical History:  Procedure Laterality Date  . ABDOMINAL HYSTERECTOMY     Family History: No family history on file. Family Psychiatric  History: Reviewed Social History:  Social History   Substance and Sexual Activity  Alcohol Use No     Social History   Substance and Sexual Activity  Drug Use No    Social History   Socioeconomic History  . Marital status: Single    Spouse name: Not on file  . Number of children: Not on file  . Years of education: Not on file  . Highest education level: Not on file  Occupational History  . Not on file  Tobacco Use  . Smoking status: Never Smoker  . Smokeless tobacco: Never Used  Substance and Sexual Activity  . Alcohol use: No  . Drug use: No  . Sexual  activity: Not on file  Other Topics Concern  . Not on file  Social History Narrative  . Not on file   Social Determinants of Health   Financial Resource Strain: Not on file  Food Insecurity: Not on file  Transportation Needs: Not on file  Physical Activity: Not on file  Stress: Not on file  Social Connections: Not on file   Additional Social History:    Allergies:   Allergies  Allergen Reactions  . Paliperidone     Other reaction(s): Hives / Skin Rash  . Quetiapine Fumarate Rash    Other reaction(s): Hives / Skin Rash  .  Risperidone Rash    Other reaction(s): Hives / Skin Rash  . Diphenhydramine Hcl     Other reaction(s): Difficulty breathing, Hives / Skin Rash  . Latex Rash    Other reaction(s): Rash, Rash Other reaction(s): DERMATITIS Other reaction(s): DERMATITIS     Labs:  Results for orders placed or performed during the hospital encounter of 02/27/20 (from the past 48 hour(s))  Glucose, capillary     Status: Abnormal   Collection Time: 04/04/20  4:48 PM  Result Value Ref Range   Glucose-Capillary 136 (H) 70 - 99 mg/dL    Comment: Glucose reference range applies only to samples taken after fasting for at least 8 hours.  Glucose, capillary     Status: Abnormal   Collection Time: 04/04/20  8:58 PM  Result Value Ref Range   Glucose-Capillary 139 (H) 70 - 99 mg/dL    Comment: Glucose reference range applies only to samples taken after fasting for at least 8 hours.  Basic metabolic panel     Status: Abnormal   Collection Time: 04/05/20 12:59 AM  Result Value Ref Range   Sodium 136 135 - 145 mmol/L   Potassium 5.0 3.5 - 5.1 mmol/L   Chloride 104 98 - 111 mmol/L   CO2 22 22 - 32 mmol/L   Glucose, Bld 160 (H) 70 - 99 mg/dL    Comment: Glucose reference range applies only to samples taken after fasting for at least 8 hours.   BUN 34 (H) 8 - 23 mg/dL   Creatinine, Ser 1.19 (H) 0.44 - 1.00 mg/dL   Calcium 9.4 8.9 - 14.7 mg/dL   GFR, Estimated 29 (L) >60 mL/min    Comment: (NOTE) Calculated using the CKD-EPI Creatinine Equation (2021)    Anion gap 10 5 - 15    Comment: Performed at [facility], [address] [place], [place] [address]  Glucose, capillary     Status: Abnormal   Collection Time: 04/05/20  6:40 AM  Result Value Ref Range   Glucose-Capillary 110 (H) 70 - 99 mg/dL    Comment: Glucose reference range applies only to samples taken after fasting for at least 8 hours.  Glucose, capillary     Status: Abnormal   Collection Time: 04/05/20 11:36 AM  Result Value Ref Range    Glucose-Capillary 149 (H) 70 - 99 mg/dL    Comment: Glucose reference range applies only to samples taken after fasting for at least 8 hours.  Basic metabolic panel     Status: Abnormal   Collection Time: 04/05/20 12:00 PM  Result Value Ref Range   Sodium 135 135 - 145 mmol/L   Potassium 4.7 3.5 - 5.1 mmol/L   Chloride 100 98 - 111 mmol/L   CO2 26 22 - 32 mmol/L   Glucose, Bld 150 (H) 70 - 99 mg/dL  Comment: Glucose reference range applies only to samples taken after fasting for at least 8 hours.   BUN 35 (H) 8 - 23 mg/dL   Creatinine, Ser 4.16 (H) 0.44 - 1.00 mg/dL   Calcium 9.5 8.9 - 60.6 mg/dL   GFR, Estimated 30 (L) >60 mL/min    Comment: (NOTE) Calculated using the CKD-EPI Creatinine Equation (2021)    Anion gap 9 5 - 15    Comment: Performed at [facility], [address], [place], [place] [address]  Glucose, capillary     Status: Abnormal   Collection Time: 04/05/20  4:42 PM  Result Value Ref Range   Glucose-Capillary 175 (H) 70 - 99 mg/dL    Comment: Glucose reference range applies only to samples taken after fasting for at least 8 hours.  Glucose, capillary     Status: Abnormal   Collection Time: 04/05/20  8:58 PM  Result Value Ref Range   Glucose-Capillary 147 (H) 70 - 99 mg/dL    Comment: Glucose reference range applies only to samples taken after fasting for at least 8 hours.  Basic metabolic panel     Status: Abnormal   Collection Time: 04/06/20  6:42 AM  Result Value Ref Range   Sodium 137 135 - 145 mmol/L   Potassium 4.2 3.5 - 5.1 mmol/L   Chloride 103 98 - 111 mmol/L   CO2 23 22 - 32 mmol/L   Glucose, Bld 135 (H) 70 - 99 mg/dL    Comment: Glucose reference range applies only to samples taken after fasting for at least 8 hours.   BUN 35 (H) 8 - 23 mg/dL   Creatinine, Ser 1.09 (H) 0.44 - 1.00 mg/dL   Calcium 9.6 8.9 - 32.3 mg/dL   GFR, Estimated 32 (L) >60 mL/min    Comment: (NOTE) Calculated using the CKD-EPI Creatinine Equation (2021)    Anion  gap 11 5 - 15    Comment: Performed at [facility], [address], [place], [place] [address]  CBC     Status: Abnormal   Collection Time: 04/06/20  6:42 AM  Result Value Ref Range   WBC 8.3 4.0 - 10.5 K/uL   RBC 3.31 (L) 3.87 - 5.11 MIL/uL   Hemoglobin 9.1 (L) 12.0 - 15.0 g/dL   HCT 20.2 (L) 54.2 - 70.6 %   MCV 88.5 80.0 - 100.0 fL   MCH 27.5 26.0 - 34.0 pg   MCHC 31.1 30.0 - 36.0 g/dL   RDW 23.7 62.8 - 31.5 %   Platelets 238 150 - 400 K/uL   nRBC 0.0 0.0 - 0.2 %    Comment: Performed at [facility], [address] [place], [place] [address]  Glucose, capillary     Status: Abnormal   Collection Time: 04/06/20  6:47 AM  Result Value Ref Range   Glucose-Capillary 125 (H) 70 - 99 mg/dL    Comment: Glucose reference range applies only to samples taken after fasting for at least 8 hours.  Glucose, capillary     Status: Abnormal   Collection Time: 04/06/20 11:32 AM  Result Value Ref Range   Glucose-Capillary 116 (H) 70 - 99 mg/dL    Comment: Glucose reference range applies only to samples taken after fasting for at least 8 hours.    Current Facility-Administered Medications  Medication Dose Route Frequency Provider Last Rate Last Admin  . acetaminophen (TYLENOL) tablet 650 mg  650 mg Oral Q6H PRN Norins, Rosalyn Gess, MD  650 mg at 03/03/20 2156   Or  . acetaminophen (TYLENOL) suppository 650 mg  650 mg Rectal Q6H PRN Norins, Rosalyn GessMichael E, MD      . amantadine (SYMMETREL) 50 MG/5ML solution 100 mg  100 mg Oral BID Lodema Hongierce, Dwayne A, RPH   100 mg at 04/06/20 1000  . aspirin EC tablet 81 mg  81 mg Oral Daily Theotis BarrioLee, Joshua K, MD   81 mg at 04/06/20 1001  . atorvastatin (LIPITOR) tablet 40 mg  40 mg Oral Daily Norins, Rosalyn GessMichael E, MD   40 mg at 04/06/20 1001  . benztropine (COGENTIN) tablet 1 mg  1 mg Oral QHS Charm RingsLord, Jamison Y, NP   1 mg at 04/05/20 2227  . enoxaparin (LOVENOX) injection 40 mg  40 mg Subcutaneous Q24H Remo Lippshen, Joshua Y, MD   40 mg at 04/06/20 1000  . feeding supplement  (ENSURE ENLIVE / ENSURE PLUS) liquid 237 mL  237 mL Oral TID BM Reymundo PollGuilloud, Carolyn, MD   237 mL at 04/05/20 2226  . insulin aspart (novoLOG) injection 0-15 Units  0-15 Units Subcutaneous TID WC Norins, Rosalyn GessMichael E, MD   2 Units at 04/06/20 [id number]  . lactated ringers infusion   Intravenous Continuous Remo Lippshen, Joshua Y, MD 100 mL/hr at 04/06/20 0830 New Bag at 04/06/20 0830  . melatonin tablet 3 mg  3 mg Oral QHS Dagar, Geralynn RileAnjali, MD   3 mg at 04/05/20 2226  . multivitamin with minerals tablet 1 tablet  1 tablet Oral Daily Reymundo PollGuilloud, Carolyn, MD   1 tablet at 04/06/20 1001  . OLANZapine (ZYPREXA) tablet 10 mg  10 mg Oral QHS Dagar, Geralynn RileAnjali, MD   10 mg at 04/05/20 2226  . OLANZapine (ZYPREXA) tablet 2.5 mg  2.5 mg Oral Daily Charm RingsLord, Jamison Y, NP   2.5 mg at 04/06/20 1001  . pantoprazole (PROTONIX) EC tablet 40 mg  40 mg Oral Daily Theotis BarrioLee, Joshua K, MD   40 mg at 04/06/20 1001  . polyethylene glycol (MIRALAX / GLYCOLAX) packet 17 g  17 g Oral BID Verdene LennertBasaraba, Iulia, MD   17 g at 04/06/20 1002  . senna (SENOKOT) tablet 17.2 mg  2 tablet Oral BID Verdene LennertBasaraba, Iulia, MD   17.2 mg at 04/06/20 1001  . sertraline (ZOLOFT) tablet 100 mg  100 mg Oral Daily Maryagnes AmosStarkes-Perry, Takia S, FNP   100 mg at 04/06/20 1001  . traZODone (DESYREL) tablet 100 mg  100 mg Oral QHS Dagar, Geralynn RileAnjali, MD   100 mg at 04/05/20 2227    Musculoskeletal: Strength & Muscle Tone: Tremors Gait & Station: Unable to assess as patient is lying on the bed Patient leans: N/A  Psychiatric Specialty Exam: Physical Exam  Review of Systems  Blood pressure (!) 149/89, pulse 80, temperature (!) 97.3 F (36.3 C), temperature source Oral, resp. rate 18, weight 74.8 kg, SpO2 93 %.Body mass index is 30.18 kg/m.  General Appearance: Fairly Groomed  Eye Contact:  Fair  Speech:  At times incoherent.  Babbling  Volume:  Decreased  Mood:  Dysphoric  Affect:  Blunt  Thought Process:  Descriptions of Associations: Circumstantial  Orientation:  Full (Time, Place, and  Person)  Thought Content:  Hallucinations: Noticed talking to herself as a peer responding to internal stimuli  Suicidal Thoughts:  No  Homicidal Thoughts:  No  Memory:  Immediate;   Reported that she is in the hospital but did will to provide details.   Recent;   See above Remote;   See above  Judgement:  Fair  Insight:  Fair  Psychomotor Activity:  Restlessness and Tremor  Concentration:  Concentration: Poor and Attention Span: Poor  Recall:  Poor  Fund of Knowledge:  Unable to assess as patient did not talk  Language:  Poor  Akathisia:  Yes  Handed:  Right  AIMS (if indicated):     Assets:  Desire for Improvement Housing Social Support  ADL's:  Impaired  Cognition:  Impaired,  Mild  Sleep:        Treatment Plan Summary: Plan Schizophrenia, chronic paranoid type.  Possible TDs   I reviewed previous notes from other providers.  As per chart patient has been on Prolixin and Ingrezza as outpatient and did well.  Her family noticed since started olanzapine she has more tremors and EPS.  Her sister believes patient did well when she was taking once a month injection.  She is not sure about the medication dose and name but she will call us back to provide more information.  Recommend to discontinue olanzapine for now.  Consider Prolixin 10 mg at bedtime since she took in the past for a long time and did not exhibit any EPS.  However she will require Ingrezza 40 mg a day for TDs.  Use Ativan 1 -2 mg for severe agitation and avoid giving antipsychotic medication due to history of TDs and tremors.  Please call psychiatry consultation liaison services if needed in the future.  Disposition: Patient does not meet criteria for psychiatric inpatient admission.  Cleotis Nipper, MD 04/06/2020 1:16 PM

## 2020-04-06 NOTE — Progress Notes (Signed)
Paged provider   Unable to get accurate vital sign due to patient temors and constant moving.  will not remain still for equipment to stay in place to accurately measure.

## 2020-04-06 NOTE — Progress Notes (Addendum)
   Subjective:   Patient is actively hallucinating on exam today. Appeared very restless. Unable to answer questions on exam today and unable to redirect. Denies any pain.    Objective:  Vital signs in last 24 hours: Vitals:   04/05/20 0456 04/05/20 0856 04/05/20 1751 04/06/20 0620  BP: 128/80 (!) 110/34 (!) 131/53 (!) 104/42  Pulse: (!) 58 67 60 88  Resp: 18 19 19 18   Temp: 98 F (36.7 C) 98.3 F (36.8 C) 98 F (36.7 C) 99.5 F (37.5 C)  TempSrc: Oral   Oral  SpO2: 98% 96% 96% 97%  Weight:        Physical Exam Constitutional: no acute distress, restless on exam Head: atraumatic ENT: external ears normal Eyes: EOMI Cardiovascular: regular rate and rhythm, normal heart sounds Pulmonary: effort normal, lungs clear to ascultation bilaterally Abdominal: flat, nontender, no rebound tenderness, bowel sounds normal Skin: warm and dry Neurological: alert, no focal deficit, moderate kinetic tremor of bilateral upper extremities Psychiatric:  Akathisia, tardive dyskinesia, does seem to be responding to internal stimuli and actively hallucinating   Assessment/Plan: Jessica Campbell is a 64 y.o. female with hx of schizoaffective disorder with paranoia, HTN, HLD, DM, depression presenting with AKI which has improved and schizoaffective disorder, pending placement in geri psych vs SNF with close outpatient psych f/u.  Principal Problem:   Paranoid schizophrenia, chronic condition with acute exacerbation (HCC) Active Problems:   AKI (acute kidney injury) (HCC)   DM (diabetes mellitus), type 2 (HCC)   HTN (hypertension), benign   HLD (hyperlipidemia)   Acute kidney injury (HCC)   Hallucinations   Delirium   Anemia   Malnutrition of moderate degree   #Schizoaffective disorder Patient actively hallucinating and responding to internal stimuli on exam today.  Additionally, restless on exam with akathisia and tardive dyskinesia.  Will reconsult psychiatry given patient's extensive  current psychiatric medication regimen.  Patient is still pending SNF placement.  We will continue current psych medications as recommended per psych.  Most recent note recommended against use of benzodiazepines. - Psychiatry reconsulted, appreciate recommendations - Zoloft 100mg  daily - Trazodone 100 mg qHS -Zyprexa 2.5 mg in AM and 10 mgQHS - Melatonin 3 mg QHS  # Drug-induced extrapyramidal movement disorder Increased kinetic tremor today.  Reconsulted psych. - Amantadine 100 mg BID - Benztropine 0.5 mg BID   # AKI on CKD3a Creatinine of 1 1.76 <1.86 < 1.94, improving with IV hydration.  We will continue to monitor. - holding lisinopril - Avoid nephrotoxic agents  - Encourage PO intake - IV fluids  # Physical Deconditioning In the setting of prolonged hospitalization - Continue working with PT/OT   Diet:  regular IVF:  none VTE:  lovenox Prior to Admission Living Arrangement:  home Anticipated Discharge Location:  SNF Barriers to Discharge:  Placement Dispo: Patient is medically stable for discharge pending placement.  65, MD 04/06/2020, 6:23 AM Pager: [phone] After 5pm on weekdays and 1pm on weekends: On Call pager [phone]

## 2020-04-07 DIAGNOSIS — G2570 Drug induced movement disorder, unspecified: Secondary | ICD-10-CM | POA: Diagnosis not present

## 2020-04-07 DIAGNOSIS — N179 Acute kidney failure, unspecified: Secondary | ICD-10-CM | POA: Diagnosis not present

## 2020-04-07 DIAGNOSIS — E119 Type 2 diabetes mellitus without complications: Secondary | ICD-10-CM | POA: Diagnosis not present

## 2020-04-07 DIAGNOSIS — F200 Paranoid schizophrenia: Secondary | ICD-10-CM | POA: Diagnosis not present

## 2020-04-07 LAB — BASIC METABOLIC PANEL
Anion gap: 12 (ref 5–15)
BUN: 27 mg/dL — ABNORMAL HIGH (ref 8–23)
CO2: 23 mmol/L (ref 22–32)
Calcium: 10.1 mg/dL (ref 8.9–10.3)
Chloride: 107 mmol/L (ref 98–111)
Creatinine, Ser: 1.82 mg/dL — ABNORMAL HIGH (ref 0.44–1.00)
GFR, Estimated: 31 mL/min — ABNORMAL LOW (ref >60)
Glucose, Bld: 117 mg/dL — ABNORMAL HIGH (ref 70–99)
Potassium: 4.3 mmol/L (ref 3.5–5.1)
Sodium: 142 mmol/L (ref 135–145)

## 2020-04-07 LAB — CBC
HCT: 28.4 % — ABNORMAL LOW (ref 36.0–46.0)
Hemoglobin: 9.2 g/dL — ABNORMAL LOW (ref 12.0–15.0)
MCH: 28.7 pg (ref 26.0–34.0)
MCHC: 32.4 g/dL (ref 30.0–36.0)
MCV: 88.5 fL (ref 80.0–100.0)
Platelets: 274 10*3/uL (ref 150–400)
RBC: 3.21 MIL/uL — ABNORMAL LOW (ref 3.87–5.11)
RDW: 13.7 % (ref 11.5–15.5)
WBC: 9.1 10*3/uL (ref 4.0–10.5)
nRBC: 0 % (ref 0.0–0.2)

## 2020-04-07 LAB — GLUCOSE, CAPILLARY
Glucose-Capillary: 133 mg/dL — ABNORMAL HIGH (ref 70–99)
Glucose-Capillary: 149 mg/dL — ABNORMAL HIGH (ref 70–99)
Glucose-Capillary: 110 mg/dL — ABNORMAL HIGH (ref 70–99)
Glucose-Capillary: 107 mg/dL — ABNORMAL HIGH (ref 70–99)

## 2020-04-07 MED ORDER — LACTATED RINGERS IV SOLN: INTRAVENOUS | Status: AC

## 2020-04-07 MED ORDER — LORAZEPAM 2 MG/ML IJ SOLN
2.0000 mg | Freq: Once | INTRAMUSCULAR | Status: AC
  Administered 2020-04-07: 2 mg via INTRAVENOUS
  Filled 2020-04-07: qty 1

## 2020-04-07 MED ORDER — SERTRALINE HCL 50 MG PO TABS
50.0000 mg | ORAL_TABLET | Freq: Every day | ORAL | Status: DC
  Administered 2020-04-08 – 2020-04-16 (×9): 50 mg via ORAL
  Filled 2020-04-07 (×9): qty 1

## 2020-04-07 MED ORDER — BENZTROPINE MESYLATE 0.5 MG PO TABS
0.5000 mg | ORAL_TABLET | Freq: Every day | ORAL | Status: DC
  Administered 2020-04-08 – 2020-04-16 (×9): 0.5 mg via ORAL
  Filled 2020-04-07 (×9): qty 1

## 2020-04-07 NOTE — Consult Note (Signed)
[facility] Face-to-Face Psychiatry Consult   Reason for Consult: Tremors, EPS Referring Physician: Dr. Para March Patient Identification: Jessica Campbell MRN:  [id number] Principal Diagnosis: Paranoid schizophrenia, chronic condition with acute exacerbation (HCC) Diagnosis:    Total Time spent with patient: 20 minutes  Subjective:   Jessica Campbell is a 65 y.o. female patient admitted with acute kidney injury.  Patient has history of schizophrenia.  She has been seen by consultation liaison services periodically.  Consult was recalled because patient experiencing increase in tremors.    HPI: Patient is seen and chart reviewed.  Her tremors continue to get worse.  She is now on Prolixin and Zyprexa was discontinued.  I talked to her sister Alfonse Ras who confirmed that patient did well on Prolixin.  However since we started the Prolixin 10 mg yesterday she deteriorated with her tremors.  I review current medication.  Discontinue Prolixin, amantadine and cut down Cogentin to 0.5 mg a day.  Reduce Zoloft 50 mg daily.  Manage her agitation only with Ativan as needed.  We will see if her tremors improved by discontinuing psychotropic medication.  We may consider low-dose Rexulti 0.5 mg once her tremors improved.  Past Psychiatric History: Reviewed.  As per chart she has given Prolixin, Risperdal, Invega, Seroquel.  However it is unclear which medicine helped her the most.  She has a history of inpatient but no history of suicidal attempt.  Her last inpatient was at old [facility].  Risk to Self:   Risk to Others:   Prior Inpatient Therapy:   Prior Outpatient Therapy:    Past Medical History:  Past Medical History:  Diagnosis Date  . Borderline diabetes   . Depression   . Diabetes mellitus without complication (HCC)   . High cholesterol   . Hypertension   . Schizoaffective disorder (HCC)   . Scoliosis     Past Surgical History:  Procedure Laterality Date  . ABDOMINAL HYSTERECTOMY     Family  History: No family history on file. Family Psychiatric  History: Reviewed Social History:  Social History   Substance and Sexual Activity  Alcohol Use No     Social History   Substance and Sexual Activity  Drug Use No    Social History   Socioeconomic History  . Marital status: Single    Spouse name: Not on file  . Number of children: Not on file  . Years of education: Not on file  . Highest education level: Not on file  Occupational History  . Not on file  Tobacco Use  . Smoking status: Never Smoker  . Smokeless tobacco: Never Used  Substance and Sexual Activity  . Alcohol use: No  . Drug use: No  . Sexual activity: Not on file  Other Topics Concern  . Not on file  Social History Narrative  . Not on file   Social Determinants of Health   Financial Resource Strain: Not on file  Food Insecurity: Not on file  Transportation Needs: Not on file  Physical Activity: Not on file  Stress: Not on file  Social Connections: Not on file   Additional Social History:    Allergies:   Allergies  Allergen Reactions  . Paliperidone     Other reaction(s): Hives / Skin Rash  . Quetiapine Fumarate Rash    Other reaction(s): Hives / Skin Rash  . Risperidone Rash    Other reaction(s): Hives / Skin Rash  . Diphenhydramine Hcl     Other reaction(s): Difficulty breathing, Hives /  Skin Rash  . Latex Rash    Other reaction(s): Rash, Rash Other reaction(s): DERMATITIS Other reaction(s): DERMATITIS     Labs:  Results for orders placed or performed during the hospital encounter of 02/27/20 (from the past 48 hour(s))  Glucose, capillary     Status: Abnormal   Collection Time: 04/05/20  4:42 PM  Result Value Ref Range   Glucose-Capillary 175 (H) 70 - 99 mg/dL    Comment: Glucose reference range applies only to samples taken after fasting for at least 8 hours.  Glucose, capillary     Status: Abnormal   Collection Time: 04/05/20  8:58 PM  Result Value Ref Range    Glucose-Capillary 147 (H) 70 - 99 mg/dL    Comment: Glucose reference range applies only to samples taken after fasting for at least 8 hours.  Basic metabolic panel     Status: Abnormal   Collection Time: 04/06/20  6:42 AM  Result Value Ref Range   Sodium 137 135 - 145 mmol/L   Potassium 4.2 3.5 - 5.1 mmol/L   Chloride 103 98 - 111 mmol/L   CO2 23 22 - 32 mmol/L   Glucose, Bld 135 (H) 70 - 99 mg/dL    Comment: Glucose reference range applies only to samples taken after fasting for at least 8 hours.   BUN 35 (H) 8 - 23 mg/dL   Creatinine, Ser 2.70 (H) 0.44 - 1.00 mg/dL   Calcium 9.6 8.9 - 35.0 mg/dL   GFR, Estimated 32 (L) >60 mL/min    Comment: (NOTE) Calculated using the CKD-EPI Creatinine Equation (2021)    Anion gap 11 5 - 15    Comment: Performed at [facility], [address], [place], [place] [address]  CBC     Status: Abnormal   Collection Time: 04/06/20  6:42 AM  Result Value Ref Range   WBC 8.3 4.0 - 10.5 K/uL   RBC 3.31 (L) 3.87 - 5.11 MIL/uL   Hemoglobin 9.1 (L) 12.0 - 15.0 g/dL   HCT 82.9 (L) 93.7 - 16.9 %   MCV 88.5 80.0 - 100.0 fL   MCH 27.5 26.0 - 34.0 pg   MCHC 31.1 30.0 - 36.0 g/dL   RDW 67.8 93.8 - 10.1 %   Platelets 238 150 - 400 K/uL   nRBC 0.0 0.0 - 0.2 %    Comment: Performed at [facility], [address], [place], [place] [address]  Glucose, capillary     Status: Abnormal   Collection Time: 04/06/20  6:47 AM  Result Value Ref Range   Glucose-Capillary 125 (H) 70 - 99 mg/dL    Comment: Glucose reference range applies only to samples taken after fasting for at least 8 hours.  Glucose, capillary     Status: Abnormal   Collection Time: 04/06/20 11:32 AM  Result Value Ref Range   Glucose-Capillary 116 (H) 70 - 99 mg/dL    Comment: Glucose reference range applies only to samples taken after fasting for at least 8 hours.  Glucose, capillary     Status: None   Collection Time: 04/06/20  4:58 PM  Result Value Ref Range   Glucose-Capillary 99  70 - 99 mg/dL    Comment: Glucose reference range applies only to samples taken after fasting for at least 8 hours.  Glucose, capillary     Status: Abnormal   Collection Time: 04/06/20  9:12 PM  Result Value Ref Range   Glucose-Capillary 188 (H) 70 - 99  mg/dL    Comment: Glucose reference range applies only to samples taken after fasting for at least 8 hours.  Basic metabolic panel     Status: Abnormal   Collection Time: 04/07/20  2:51 AM  Result Value Ref Range   Sodium 142 135 - 145 mmol/L   Potassium 4.3 3.5 - 5.1 mmol/L   Chloride 107 98 - 111 mmol/L   CO2 23 22 - 32 mmol/L   Glucose, Bld 117 (H) 70 - 99 mg/dL    Comment: Glucose reference range applies only to samples taken after fasting for at least 8 hours.   BUN 27 (H) 8 - 23 mg/dL   Creatinine, Ser 4.65 (H) 0.44 - 1.00 mg/dL   Calcium 03.5 8.9 - 46.5 mg/dL   GFR, Estimated 31 (L) >60 mL/min    Comment: (NOTE) Calculated using the CKD-EPI Creatinine Equation (2021)    Anion gap 12 5 - 15    Comment: Performed at [facility], [address] [place], [place] [address]  CBC     Status: Abnormal   Collection Time: 04/07/20  2:51 AM  Result Value Ref Range   WBC 9.1 4.0 - 10.5 K/uL   RBC 3.21 (L) 3.87 - 5.11 MIL/uL   Hemoglobin 9.2 (L) 12.0 - 15.0 g/dL   HCT 51.7 (L) 00.1 - 74.9 %   MCV 88.5 80.0 - 100.0 fL   MCH 28.7 26.0 - 34.0 pg   MCHC 32.4 30.0 - 36.0 g/dL   RDW 44.9 67.5 - 91.6 %   Platelets 274 150 - 400 K/uL   nRBC 0.0 0.0 - 0.2 %    Comment: Performed at [facility], [address], [place], [place] [address]  Glucose, capillary     Status: Abnormal   Collection Time: 04/07/20  6:58 AM  Result Value Ref Range   Glucose-Capillary 133 (H) 70 - 99 mg/dL    Comment: Glucose reference range applies only to samples taken after fasting for at least 8 hours.  Glucose, capillary     Status: Abnormal   Collection Time: 04/07/20 12:59 PM  Result Value Ref Range   Glucose-Capillary 149 (H) 70 - 99 mg/dL     Comment: Glucose reference range applies only to samples taken after fasting for at least 8 hours.    Current Facility-Administered Medications  Medication Dose Route Frequency Provider Last Rate Last Admin  . acetaminophen (TYLENOL) tablet 650 mg  650 mg Oral Q6H PRN Jacques Navy, MD   650 mg at 03/03/20 2156   Or  . acetaminophen (TYLENOL) suppository 650 mg  650 mg Rectal Q6H PRN Norins, Rosalyn Gess, MD      . amantadine (SYMMETREL) 50 MG/5ML solution 100 mg  100 mg Oral BID Lodema Hong A, RPH   100 mg at 04/07/20 1042  . aspirin EC tablet 81 mg  81 mg Oral Daily Theotis Barrio, MD   81 mg at 04/07/20 1042  . atorvastatin (LIPITOR) tablet 40 mg  40 mg Oral Daily Norins, Rosalyn Gess, MD   40 mg at 04/07/20 1042  . benztropine (COGENTIN) tablet 2 mg  2 mg Oral QHS Rehman, Areeg N, DO   2 mg at 04/06/20 2248  . enoxaparin (LOVENOX) injection 40 mg  40 mg Subcutaneous Q24H Remo Lipps, MD   40 mg at 04/07/20 1323  . feeding supplement (ENSURE ENLIVE / ENSURE PLUS) liquid 237 mL  237 mL Oral TID BM Reymundo Poll, MD  237 mL at 04/07/20 1323  . fluPHENAZine (PROLIXIN) tablet 10 mg  10 mg Oral QHS Rehman, Areeg N, DO   10 mg at 04/07/20 0030  . insulin aspart (novoLOG) injection 0-15 Units  0-15 Units Subcutaneous TID WC Norins, Heinz Knuckles, MD   2 Units at 04/07/20 1322  . lactated ringers infusion   Intravenous Continuous Andrew Au, MD 100 mL/hr at 04/07/20 0824 New Bag at 04/07/20 0824  . LORazepam (ATIVAN) tablet 1-2 mg  1-2 mg Oral QHS PRN Rehman, Areeg N, DO   2 mg at 04/06/20 2248  . melatonin tablet 3 mg  3 mg Oral QHS Dagar, Meredith Staggers, MD   3 mg at 04/06/20 2242  . multivitamin with minerals tablet 1 tablet  1 tablet Oral Daily Velna Ochs, MD   1 tablet at 04/07/20 1042  . pantoprazole (PROTONIX) EC tablet 40 mg  40 mg Oral Daily Mosetta Anis, MD   40 mg at 04/07/20 1042  . polyethylene glycol (MIRALAX / GLYCOLAX) packet 17 g  17 g Oral BID Jose Persia, MD   17 g  at 04/06/20 2249  . senna (SENOKOT) tablet 17.2 mg  2 tablet Oral BID Jose Persia, MD   17.2 mg at 04/07/20 1042  . sertraline (ZOLOFT) tablet 100 mg  100 mg Oral Daily Suella Broad, FNP   100 mg at 04/07/20 1042  . traZODone (DESYREL) tablet 100 mg  100 mg Oral QHS Dagar, Meredith Staggers, MD   100 mg at 04/06/20 2243    Musculoskeletal: Strength & Muscle Tone: Harmony: Unable to assess as patient is lying on the bed Patient leans: N/A  Psychiatric Specialty Exam: Physical Exam  Review of Systems  Blood pressure 102/75, pulse 85, temperature 98.8 F (37.1 C), resp. rate 18, weight 74.8 kg, SpO2 96 %.Body mass index is 30.18 kg/m.  General Appearance: Fairly Groomed  Eye Contact:  Fair  Speech:  Slurred  Volume:  Decreased  Mood:  Dysphoric  Affect:  Blunt  Thought Process:  Descriptions of Associations: Circumstantial  Orientation:  Full (Time, Place, and Person)  Thought Content:  Illogical and Hallucinations: Noticed talking to herself as a peer responding to internal stimuli  Suicidal Thoughts:  No  Homicidal Thoughts:  No  Memory:  Immediate;   Poor Recent;   Poor Remote;   Poor  Judgement:  Fair  Insight:  Fair  Psychomotor Activity:  Restlessness and Tremor  Concentration:  Concentration: Poor and Attention Span: Poor  Recall:  Poor  Fund of Knowledge:  Unable to assess as patient did not talk  Language:  Poor  Akathisia:  Yes  Handed:  Right  AIMS (if indicated):     Assets:  Desire for Improvement Housing Social Support  ADL's:  Impaired  Cognition:  Impaired,  Mild  Sleep:        Treatment Plan Summary: Plan Schizophrenia, chronic paranoid type.  Possible TDs   Discontinue Prolixin, amantadine and reduce Cogentin 0.5 mg and Zoloft 50 mg.  We will monitor if tremors get him better or discontinuing psychotropic medication.  Consider neurology consult if tremors do not improved.  Case discussed with the Dr. Vallarie Mare.  We will consider  starting Rexulti 0.5 mg if tremor improved.  Psychiatry will continue to follow.  Disposition: Patient does not meet criteria for psychiatric inpatient admission.  Kathlee Nations, MD 04/07/2020 1:44 PM

## 2020-04-07 NOTE — Progress Notes (Signed)
In and out cath attempted by 2 nurses but no urine return, will try later.

## 2020-04-07 NOTE — Progress Notes (Addendum)
Subjective:   Patient is alert but confused this morning, responding to internal stimuli. Similar to yesterday. Displays akathisia and kinetic tremors on exam. Seems to deny having any pain.  Per RN, did not sleep last night. Received Ativan at bedtime for agitation, but did not seem to help.  Spoke with sister, Jessica Campbell, confirmed that her home medications after being discharged from [facility] was Prolixin injectable 25 mg every 14 days, Zoloft 50 mg daily, valbenazine, and trazodone at bedtime.  States she was fairly well controlled on this.  Spoke with pace physician who informed me that she was switched from tizanidine to Abilify for EPS on 11/16.   Objective:  Vital signs in last 24 hours: Vitals:   04/06/20 0620 04/06/20 0836 04/06/20 2109 04/07/20 0549  BP: (!) 104/42 (!) 149/89    Pulse: 88 80 96 86  Resp: 18 18 20 18   Temp: 99.5 F (37.5 C) (!) 97.3 F (36.3 C) (!) 97.1 F (36.2 C) (!) 97.5 F (36.4 C)  TempSrc: Oral Oral Axillary Axillary  SpO2: 97% 93% 94% 96%  Weight:        Physical Exam Constitutional: no acute distress, restless on exam, confused Head: atraumatic ENT: external ears normal, dry mucous membranes Eyes: EOMI, tracks appropriately Cardiovascular: regular rate and rhythm, normal heart sounds Pulmonary: effort normal, lungs clear to ascultation bilaterally Abdominal: flat, nontender, no rebound tenderness, bowel sounds normal Skin: warm and dry Neurological: alert, moderate kinetic tremor of bilateral upper extremities, akathisia Psychiatric:  Akathisia, tardive dyskinesia, does seem to be responding to internal stimuli and actively hallucinating   Assessment/Plan: Jessica Campbell is a 65 y.o. female with hx of schizoaffective disorder with paranoia, HTN, HLD, DM, depression presenting with AKI which has improved and schizoaffective disorder, pending placement in geri psych vs SNF with close outpatient psych f/u.  Principal Problem:   Paranoid  schizophrenia, chronic condition with acute exacerbation (HCC) Active Problems:   AKI (acute kidney injury) (HCC)   DM (diabetes mellitus), type 2 (HCC)   HTN (hypertension), benign   HLD (hyperlipidemia)   Acute kidney injury (HCC)   Hallucinations   Delirium   Anemia   Malnutrition of moderate degree   #Schizoaffective disorder Patient actively hallucinating and responding to internal stimuli on exam today, which is an acute change over the past 3 days.  Additionally, restless on exam with akathisia and tardive dyskinesia. Was seen by psychiatry yesterday and adjusted medications, but patient did not seem to be improved at all today.  - Psychiatry reconsulted, appreciate recommendations      - Switch Zyprexa to fluphenazine 10 mg at bedtime      - Zoloft 100mg  daily      - Trazodone 100 mg qHS      - Melatonin 3 mg QHS      - Ativan 1-2mg  as needed for agitation  # Drug-induced extrapyramidal movement disorder Increased kinetic tremor today.  Reconsulted psych. - Amantadine 100 mg BID - increase Benztropine to 2mg  daily   # AKI on CKD3a Creatinine of 1.82 < 1.76 <1.86 < 1.94, improved mildly with IV hydration. Patient noted to be taking less by mouth recently.  - holding lisinopril - Avoid nephrotoxic agents  - Encourage PO intake - IV fluids - recheck FENa  # Physical Deconditioning In the setting of prolonged hospitalization - Continue working with PT/OT   Diet:  regular IVF:  none VTE:  lovenox Prior to Admission Living Arrangement:  home Anticipated Discharge Location:  SNF Barriers to Discharge:  Placement Dispo: Patient is medically stable for discharge pending placement.  Remo Lipps, MD 04/07/2020, 6:31 AM Pager: [phone] After 5pm on weekdays and 1pm on weekends: On Call pager [phone]

## 2020-04-08 DIAGNOSIS — F200 Paranoid schizophrenia: Secondary | ICD-10-CM | POA: Diagnosis not present

## 2020-04-08 DIAGNOSIS — E119 Type 2 diabetes mellitus without complications: Secondary | ICD-10-CM | POA: Diagnosis not present

## 2020-04-08 DIAGNOSIS — G2570 Drug induced movement disorder, unspecified: Secondary | ICD-10-CM | POA: Diagnosis not present

## 2020-04-08 DIAGNOSIS — N179 Acute kidney failure, unspecified: Secondary | ICD-10-CM | POA: Diagnosis not present

## 2020-04-08 LAB — CBC
HCT: 29.3 % — ABNORMAL LOW (ref 36.0–46.0)
Hemoglobin: 9.1 g/dL — ABNORMAL LOW (ref 12.0–15.0)
MCH: 27.7 pg (ref 26.0–34.0)
MCHC: 31.1 g/dL (ref 30.0–36.0)
MCV: 89.3 fL (ref 80.0–100.0)
Platelets: 217 10*3/uL (ref 150–400)
RBC: 3.28 MIL/uL — ABNORMAL LOW (ref 3.87–5.11)
RDW: 13.6 % (ref 11.5–15.5)
WBC: 17.7 10*3/uL — ABNORMAL HIGH (ref 4.0–10.5)
nRBC: 0 % (ref 0.0–0.2)

## 2020-04-08 LAB — BASIC METABOLIC PANEL
Anion gap: 9 (ref 5–15)
BUN: 22 mg/dL (ref 8–23)
CO2: 24 mmol/L (ref 22–32)
Calcium: 9.4 mg/dL (ref 8.9–10.3)
Chloride: 108 mmol/L (ref 98–111)
Creatinine, Ser: 1.42 mg/dL — ABNORMAL HIGH (ref 0.44–1.00)
GFR, Estimated: 42 mL/min — ABNORMAL LOW (ref >60)
Glucose, Bld: 162 mg/dL — ABNORMAL HIGH (ref 70–99)
Potassium: 3.9 mmol/L (ref 3.5–5.1)
Sodium: 141 mmol/L (ref 135–145)

## 2020-04-08 LAB — GLUCOSE, CAPILLARY
Glucose-Capillary: 111 mg/dL — ABNORMAL HIGH (ref 70–99)
Glucose-Capillary: 166 mg/dL — ABNORMAL HIGH (ref 70–99)
Glucose-Capillary: 149 mg/dL — ABNORMAL HIGH (ref 70–99)
Glucose-Capillary: 140 mg/dL — ABNORMAL HIGH (ref 70–99)

## 2020-04-08 MED ORDER — LACTATED RINGERS IV SOLN: INTRAVENOUS | Status: DC

## 2020-04-08 NOTE — Progress Notes (Addendum)
   Subjective:   Per night nurse, she was resting comfortably overnight without Ativan requirement.  Is also resting comfortably on exam this morning.  No overt movement disorder at this time.   Objective:  Vital signs in last 24 hours: Vitals:   04/07/20 1114 04/07/20 1544 04/07/20 2055 04/08/20 0504  BP: 102/75 (!) 146/66 (!) 120/99 120/78  Pulse: 85 70 70 68  Resp: 18 16 17 18   Temp: 98.8 F (37.1 C) 98.1 F (36.7 C) 98.4 F (36.9 C) 98 F (36.7 C)  TempSrc:  Oral Axillary   SpO2: 96% 97% 96% 96%  Weight:        Physical Exam Constitutional: Resting comfortably Head: atraumatic ENT: external ears normal Eyes: Closed Pulmonary: effort normal Abdominal: flat Skin: warm and dry Neurological: Asleep Psychiatric: Will assess later  Assessment/Plan: Jessica Campbell is a 65 y.o. female with hx of schizoaffective disorder with paranoia, HTN, HLD, DM, depression presenting with AKI which has improved and schizoaffective disorder, pending placement in geri psych vs SNF with close outpatient psych f/u.  Principal Problem:   Paranoid schizophrenia, chronic condition with acute exacerbation (HCC) Active Problems:   AKI (acute kidney injury) (HCC)   DM (diabetes mellitus), type 2 (HCC)   HTN (hypertension), benign   HLD (hyperlipidemia)   Acute kidney injury (HCC)   Hallucinations   Delirium   Anemia   Malnutrition of moderate degree   # Schizoaffective disorder For the past few days has been very hyperactive with worsening tremors and tardive dyskinesia, also actively hallucinating and responding to internal stimuli.  Per psychiatry recommendations, discontinued most of her psychotropic medications yesterday and she is resting comfortably today which is a great improvement.  -Psychiatry consulted, greatly appreciate accommodations -Stopped most of her centrally acting medications yesterday -Continue Zoloft 50 mg -Ativan 1 milligram at nighttime as needed -Melatonin 3 mg  and trazodone at night -If continued AMS and tremors, consult neurology to rule out organic cause  # Drug-induced extrapyramidal movement disorder Increased kinetic tremor today.  Reconsulted psych. - decrease Benztropine to 0.5mg  daily    # AKI on CKD3a -resolved Creatinine of 1.42 < 1.82, improving with IV hydration. Patient noted to be taking less by mouth recently.  -Holding lisinopril -Avoid nephrotoxic agents  -Encourage PO intake  -Continue IV fluids today   # Physical Deconditioning In the setting of prolonged hospitalization - Continue working with PT/OT   Diet:  regular IVF:  none VTE:  lovenox Prior to Admission Living Arrangement:  home Anticipated Discharge Location:  SNF Barriers to Discharge:  Recently worsened movement disorder Dispo: SNF   03-21-2006, MD 04/08/2020, 5:53 AM Pager: [phone] After 5pm on weekdays and 1pm on weekends: On Call pager [phone]

## 2020-04-08 NOTE — Consult Note (Signed)
[facility] Consult   Reason for Consult: Tremors, EPS Referring Physician: Dr. Damita Dunnings Patient Identification: Jessica Campbell MRN:  [id number] Principal Diagnosis: Paranoid schizophrenia, chronic condition with acute exacerbation ([place]) Diagnosis:    Total Time spent with patient: 20 minutes  Subjective:   Jessica Campbell is a 65 y.o. female patient admitted with acute kidney injury.  Patient has history of schizophrenia.     HPI: Patient seen chart reviewed.  She is doing much better since yesterday.  Her tremors are mostly resolved.  She is more clear, coherent.  Her medicines were reduced and discontinued.  She is now on Cogentin 0.5 mg daily, Zoloft 50 mg daily and trazodone 100 mg at bedtime.  Today patient told that she had history of hallucination, depression but they are chronic.  She denies any suicidal thoughts or any plan.  She is more calm and does not require as frequent medicine for agitation.  She is still some time confused but able to interact.  She responded on verbal commands.  I also spoke to staff and there has been no recent agitation or aggression.  Past Psychiatric History: Reviewed.  As per chart she has given Prolixin, Risperdal, Invega, Seroquel.  However it is unclear which medicine helped her the most.  She has a history of inpatient but no history of suicidal attempt.  Her last inpatient was at [facility].  Risk to Self:   Risk to Others:   Prior Inpatient Therapy:   Prior Outpatient Therapy:    Past Medical History:  Past Medical History:  Diagnosis Date  . Borderline diabetes   . Depression   . Diabetes mellitus without complication ([place])   . High cholesterol   . Hypertension   . Schizoaffective disorder ([place])   . Scoliosis     Past Surgical History:  Procedure Laterality Date  . ABDOMINAL HYSTERECTOMY     Family History: No family history on file. Family Psychiatric  History: Reviewed Social History:  Social History    Substance and Sexual Activity  Alcohol Use No     Social History   Substance and Sexual Activity  Drug Use No    Social History   Socioeconomic History  . Marital status: Single    Spouse name: Not on file  . Number of children: Not on file  . Years of education: Not on file  . Highest education level: Not on file  Occupational History  . Not on file  Tobacco Use  . Smoking status: Never Smoker  . Smokeless tobacco: Never Used  Substance and Sexual Activity  . Alcohol use: No  . Drug use: No  . Sexual activity: Not on file  Other Topics Concern  . Not on file  Social History Narrative  . Not on file   Social Determinants of Health   Financial Resource Strain: Not on file  Food Insecurity: Not on file  Transportation Needs: Not on file  Physical Activity: Not on file  Stress: Not on file  Social Connections: Not on file   Additional Social History:    Allergies:   Allergies  Allergen Reactions  . Paliperidone     Other reaction(s): Hives / Skin Rash  . Quetiapine Fumarate Rash    Other reaction(s): Hives / Skin Rash  . Risperidone Rash    Other reaction(s): Hives / Skin Rash  . Diphenhydramine Hcl     Other reaction(s): Difficulty breathing, Hives / Skin Rash  . Latex Rash  Other reaction(s): Rash, Rash Other reaction(s): DERMATITIS Other reaction(s): DERMATITIS     Labs:  Results for orders placed or performed during the hospital encounter of 02/27/20 (from the past 48 hour(s))  Glucose, capillary     Status: None   Collection Time: 04/06/20  4:58 PM  Result Value Ref Range   Glucose-Capillary 99 70 - 99 mg/dL    Comment: Glucose reference range applies only to samples taken after fasting for at least 8 hours.  Glucose, capillary     Status: Abnormal   Collection Time: 04/06/20  9:12 PM  Result Value Ref Range   Glucose-Capillary 188 (H) 70 - 99 mg/dL    Comment: Glucose reference range applies only to samples taken after fasting for at  least 8 hours.  Basic metabolic panel     Status: Abnormal   Collection Time: 04/07/20  2:51 AM  Result Value Ref Range   Sodium 142 135 - 145 mmol/L   Potassium 4.3 3.5 - 5.1 mmol/L   Chloride 107 98 - 111 mmol/L   CO2 23 22 - 32 mmol/L   Glucose, Bld 117 (H) 70 - 99 mg/dL    Comment: Glucose reference range applies only to samples taken after fasting for at least 8 hours.   BUN 27 (H) 8 - 23 mg/dL   Creatinine, Ser 1.32 (H) 0.44 - 1.00 mg/dL   Calcium 44.0 8.9 - 10.2 mg/dL   GFR, Estimated 31 (L) >60 mL/min    Comment: (NOTE) Calculated using the CKD-EPI Creatinine Equation (2021)    Anion gap 12 5 - 15    Comment: Performed at [facility], [address] [place], [place] [address]  CBC     Status: Abnormal   Collection Time: 04/07/20  2:51 AM  Result Value Ref Range   WBC 9.1 4.0 - 10.5 K/uL   RBC 3.21 (L) 3.87 - 5.11 MIL/uL   Hemoglobin 9.2 (L) 12.0 - 15.0 g/dL   HCT 64.4 (L) 03.4 - 74.2 %   MCV 88.5 80.0 - 100.0 fL   MCH 28.7 26.0 - 34.0 pg   MCHC 32.4 30.0 - 36.0 g/dL   RDW 59.5 63.8 - 75.6 %   Platelets 274 150 - 400 K/uL   nRBC 0.0 0.0 - 0.2 %    Comment: Performed at [facility], [address] [place], [place] [address]  Glucose, capillary     Status: Abnormal   Collection Time: 04/07/20  6:58 AM  Result Value Ref Range   Glucose-Capillary 133 (H) 70 - 99 mg/dL    Comment: Glucose reference range applies only to samples taken after fasting for at least 8 hours.  Glucose, capillary     Status: Abnormal   Collection Time: 04/07/20 12:59 PM  Result Value Ref Range   Glucose-Capillary 149 (H) 70 - 99 mg/dL    Comment: Glucose reference range applies only to samples taken after fasting for at least 8 hours.  Glucose, capillary     Status: Abnormal   Collection Time: 04/07/20  4:50 PM  Result Value Ref Range   Glucose-Capillary 110 (H) 70 - 99 mg/dL    Comment: Glucose reference range applies only to samples taken after fasting for at least 8 hours.   Glucose, capillary     Status: Abnormal   Collection Time: 04/07/20  9:21 PM  Result Value Ref Range   Glucose-Capillary 107 (H) 70 - 99 mg/dL    Comment: Glucose reference range applies  only to samples taken after fasting for at least 8 hours.  Basic metabolic panel     Status: Abnormal   Collection Time: 04/08/20  4:56 AM  Result Value Ref Range   Sodium 141 135 - 145 mmol/L   Potassium 3.9 3.5 - 5.1 mmol/L   Chloride 108 98 - 111 mmol/L   CO2 24 22 - 32 mmol/L   Glucose, Bld 162 (H) 70 - 99 mg/dL    Comment: Glucose reference range applies only to samples taken after fasting for at least 8 hours.   BUN 22 8 - 23 mg/dL   Creatinine, Ser 6.28 (H) 0.44 - 1.00 mg/dL   Calcium 9.4 8.9 - 31.5 mg/dL   GFR, Estimated 42 (L) >60 mL/min    Comment: (NOTE) Calculated using the CKD-EPI Creatinine Equation (2021)    Anion gap 9 5 - 15    Comment: Performed at [facility], [address], [place], [place] [address]  CBC     Status: Abnormal   Collection Time: 04/08/20  4:56 AM  Result Value Ref Range   WBC 17.7 (H) 4.0 - 10.5 K/uL   RBC 3.28 (L) 3.87 - 5.11 MIL/uL   Hemoglobin 9.1 (L) 12.0 - 15.0 g/dL   HCT 07.3 (L) 71.0 - 62.6 %   MCV 89.3 80.0 - 100.0 fL   MCH 27.7 26.0 - 34.0 pg   MCHC 31.1 30.0 - 36.0 g/dL   RDW 94.8 54.6 - 27.0 %   Platelets 217 150 - 400 K/uL   nRBC 0.0 0.0 - 0.2 %    Comment: Performed at [facility], [address], [place], [place] [address]  Glucose, capillary     Status: Abnormal   Collection Time: 04/08/20  6:57 AM  Result Value Ref Range   Glucose-Capillary 111 (H) 70 - 99 mg/dL    Comment: Glucose reference range applies only to samples taken after fasting for at least 8 hours.  Glucose, capillary     Status: Abnormal   Collection Time: 04/08/20 11:19 AM  Result Value Ref Range   Glucose-Capillary 166 (H) 70 - 99 mg/dL    Comment: Glucose reference range applies only to samples taken after fasting for at least 8 hours.    Current  Facility-Administered Medications  Medication Dose Route Frequency Provider Last Rate Last Admin  . acetaminophen (TYLENOL) tablet 650 mg  650 mg Oral Q6H PRN Jacques Navy, MD   650 mg at 03/03/20 2156   Or  . acetaminophen (TYLENOL) suppository 650 mg  650 mg Rectal Q6H PRN Norins, Rosalyn Gess, MD      . aspirin EC tablet 81 mg  81 mg Oral Daily Theotis Barrio, MD   81 mg at 04/08/20 1016  . atorvastatin (LIPITOR) tablet 40 mg  40 mg Oral Daily Norins, Rosalyn Gess, MD   40 mg at 04/08/20 1016  . benztropine (COGENTIN) tablet 0.5 mg  0.5 mg Oral Daily Remo Lipps, MD   0.5 mg at 04/08/20 1016  . enoxaparin (LOVENOX) injection 40 mg  40 mg Subcutaneous Q24H Remo Lipps, MD   40 mg at 04/07/20 1323  . feeding supplement (ENSURE ENLIVE / ENSURE PLUS) liquid 237 mL  237 mL Oral TID BM Reymundo Poll, MD   237 mL at 04/08/20 1016  . insulin aspart (novoLOG) injection 0-15 Units  0-15 Units Subcutaneous TID WC Norins, Rosalyn Gess, MD   2 Units at 04/07/20 1322  .  LORazepam (ATIVAN) tablet 1-2 mg  1-2 mg Oral QHS PRN Rehman, Areeg N, DO   2 mg at 04/06/20 2248  . melatonin tablet 3 mg  3 mg Oral QHS Dagar, Geralynn Rile, MD   3 mg at 04/08/20 0059  . multivitamin with minerals tablet 1 tablet  1 tablet Oral Daily Reymundo Poll, MD   1 tablet at 04/08/20 1016  . pantoprazole (PROTONIX) EC tablet 40 mg  40 mg Oral Daily Theotis Barrio, MD   40 mg at 04/08/20 1016  . polyethylene glycol (MIRALAX / GLYCOLAX) packet 17 g  17 g Oral BID Verdene Lennert, MD   17 g at 04/06/20 2249  . senna (SENOKOT) tablet 17.2 mg  2 tablet Oral BID Verdene Lennert, MD   17.2 mg at 04/08/20 1016  . sertraline (ZOLOFT) tablet 50 mg  50 mg Oral Daily Remo Lipps, MD   50 mg at 04/08/20 1016  . traZODone (DESYREL) tablet 100 mg  100 mg Oral QHS Dagar, Geralynn Rile, MD   100 mg at 04/08/20 0058    Musculoskeletal: Gait & Station: Unable to assess as patient is lying on the bed Patient leans: N/A  Psychiatric Specialty  Exam: Physical Exam  Review of Systems  Blood pressure (!) 134/53, pulse 71, temperature 98.7 F (37.1 C), temperature source Oral, resp. rate 18, weight 74.8 kg, SpO2 99 %.Body mass index is 30.18 kg/m.  General Appearance: Fairly Groomed  Eye Contact:  Fair  Speech:  More clear.  Volume:  Decreased  Mood:  Dysphoric  Affect:  Labile  Thought Process:  Descriptions of Associations: Circumstantial  Orientation:  Full (Time, Place, and Person) knows she is in the hospital.  Thought Content:  Hallucinations: Admitted chronic hallucination but denies any current command auditory hallucination.  Suicidal Thoughts:  No  Homicidal Thoughts:  No  Memory:  Immediate;   Improved Recent;   Improved Remote;   Poor  Judgement:  Fair  Insight:  Fair  Psychomotor Activity:  Significant improved from the past  Concentration:  Concentration: Poor and Attention Span: Poor  Recall:  Poor  Fund of Knowledge:  Difficulty recalling the events.  Language:  Fair  Akathisia:  No  Handed:  Right  AIMS (if indicated):     Assets:  Desire for Improvement Housing Social Support  ADL's:  Impaired  Cognition:  Impaired,  Mild  Sleep:   Improved     Treatment Plan Summary: Plan Patient tremors are improving.  She has schizophrenia but symptoms are chronic.   Patient shown improvement since yesterday.  Her tremors and shakes mostly resolved and improved.  She has chronic hallucinations and depression but denies any command auditory hallucination, suicidal thoughts or homicidal thoughts.  She still have some difficulty remembering things.  As per staff she is not agitated or aggressive.  Continue current medication.     Disposition: Patient does not meet criteria for psychiatric inpatient admission. Patient improved from the past.  Does not need inpatient psychiatric services.  Antipsychotic medication can start by outpatient provider.  Psychiatry will call off however if required further assistance  please call us back.  Thank you for the referral.  Cleotis Nipper, MD 04/08/2020 11:59 AM

## 2020-04-09 DIAGNOSIS — N179 Acute kidney failure, unspecified: Secondary | ICD-10-CM | POA: Diagnosis not present

## 2020-04-09 DIAGNOSIS — E119 Type 2 diabetes mellitus without complications: Secondary | ICD-10-CM | POA: Diagnosis not present

## 2020-04-09 DIAGNOSIS — G2570 Drug induced movement disorder, unspecified: Secondary | ICD-10-CM | POA: Diagnosis not present

## 2020-04-09 DIAGNOSIS — F200 Paranoid schizophrenia: Secondary | ICD-10-CM | POA: Diagnosis not present

## 2020-04-09 LAB — GLUCOSE, CAPILLARY
Glucose-Capillary: 110 mg/dL — ABNORMAL HIGH (ref 70–99)
Glucose-Capillary: 176 mg/dL — ABNORMAL HIGH (ref 70–99)
Glucose-Capillary: 180 mg/dL — ABNORMAL HIGH (ref 70–99)
Glucose-Capillary: 114 mg/dL — ABNORMAL HIGH (ref 70–99)

## 2020-04-09 NOTE — TOC Progression Note (Signed)
Transition of Care [facility]) - Progression Note    Patient Details  Name: Jessica Campbell MRN: [id number] Date of Birth: 02-11-1956  Transition [facility]) CM/SW Contact  Okey Dupre Lazaro Arms, LCSW Phone Number: 04/09/2020, 5:25 PM  Clinical Narrative: Talked with Angela-[facility] SW [phone]) regarding patient and placement. Update provided on patient. Marylene Land reported that she is trying to reach someone in admissions with [facility] without success. She indicated that [facility] advised here that they can't take any admissions this week. CSW checked Epic HUB and informed Marylene Land that [facility] declined due to "cannot meet patient's psychosocial needs". Marylene Land requested that patient's information be sent out to all SNF's in [facility] as any facility can get a contract with [facility].         Expected Discharge Plan and Services - SNF placement                                               Social Determinants of Health (SDOH) Interventions  Patient has psychiatric diagnosis. No longer needs psychiatric placement.   Readmission Risk Interventions No flowsheet data found.

## 2020-04-09 NOTE — Care Management Important Message (Signed)
Important Message  Patient Details  Name: Jessica Campbell MRN: [id number] Date of Birth: 26-Feb-1956   Medicare Important Message Given:  Yes     Myli Pae P Normalee Sistare 04/09/2020, 2:19 PM

## 2020-04-09 NOTE — Progress Notes (Signed)
   Subjective:   Ms.Noa was examined and evaluated at bedside this am. She was observed resting comfortably in bed. She is AAOx1 to name, thinks the year is 2028. She mentions seeing her mother as well as seeing her sister Doris yesterday. Discussed plan to d/c once placement confirmed. Ms.Rucci expressed understanding.   Contacted [facility] social worker to help with SNF placement, states she will call the remaining 2 facilities that have contracts with [facility].  Objective:  Vital signs in last 24 hours: Vitals:   04/08/20 0945 04/08/20 1719 04/08/20 2103 04/09/20 0529  BP: (!) 134/53 (!) 132/56 106/85 129/74  Pulse: 71 71 68 70  Resp: 18 18    Temp: 98.7 F (37.1 C) 98.6 F (37 C) 100.1 F (37.8 C) 99.3 F (37.4 C)  TempSrc: Oral Oral Oral Oral  SpO2: 99% 98% 96% 96%  Weight:   72.6 kg     Physical Exam Constitutional: Resting comfortably Head: atraumatic ENT: external ears normal Eyes: Closed Cardiovascular: regular rate and rhythm, normal heart sounds Pulmonary: effort normal, lungs clear to ascultation bilaterally Abdominal: flat, normal bowel sounds Skin: warm and dry Neurological: alert and oriented x2, tremulous Psychiatric: continue to have visual but not auditory hallucinations  Assessment/Plan: Quantina Dershem is a 65 y.o. female with hx of schizoaffective disorder with paranoia, HTN, HLD, DM, depression presenting with AKI which has improved and schizoaffective disorder, pending placement in geri psych vs SNF with close outpatient psych f/u.  Principal Problem:   Paranoid schizophrenia, chronic condition with acute exacerbation ([facility]) Active Problems:   AKI (acute kidney injury) ([facility])   DM (diabetes mellitus), type 2 ([facility])   HTN (hypertension), benign   HLD (hyperlipidemia)   Acute kidney injury ([facility])   Hallucinations   Delirium   Anemia   Malnutrition of moderate degree   # Schizoaffective disorder After stopping or reducing dose of many of her  psychotropic medications, her tremors and mental status has improved. She is probably close to her baseline, with continued visual hallucinations which are not bothering her. Rested well last night. -Psychiatry consulted, greatly appreciate recommendations -Continue Zoloft 50 mg -Ativan 1 milligram at nighttime as needed -Melatonin 3 mg and trazodone at night -no antipsychotic currently  # Drug-induced extrapyramidal movement disorder Continued moderate kinetic tremor. -Benztropine to 0.5mg  daily    # AKI on CKD3a -resolved Creatinine of 1.42 most recently which is her baseline. FENa of 0.6% so likely had pre-renal etiology.  -Holding lisinopril -Avoid nephrotoxic agents  -Encourage PO intake    # Physical Deconditioning In the setting of prolonged hospitalization - Continue working with PT/OT   Diet:  regular IVF:  none VTE:  lovenox Prior to Admission Living Arrangement:  home Anticipated Discharge Location:  SNF Barriers to Discharge:  Medically ready for discharge Dispo: SNF when bed is obtained  Remo Lipps, MD 04/09/2020, 6:16 AM Pager: [phone] After 5pm on weekdays and 1pm on weekends: On Call pager [phone]

## 2020-04-09 NOTE — Plan of Care (Signed)
  Problem: Coping: Goal: Level of anxiety will decrease Outcome: Progressing   Problem: Elimination: Goal: Will not experience complications related to urinary retention Outcome: Progressing   

## 2020-04-10 DIAGNOSIS — E119 Type 2 diabetes mellitus without complications: Secondary | ICD-10-CM | POA: Diagnosis not present

## 2020-04-10 DIAGNOSIS — F200 Paranoid schizophrenia: Secondary | ICD-10-CM | POA: Diagnosis not present

## 2020-04-10 DIAGNOSIS — N179 Acute kidney failure, unspecified: Secondary | ICD-10-CM | POA: Diagnosis not present

## 2020-04-10 DIAGNOSIS — G2570 Drug induced movement disorder, unspecified: Secondary | ICD-10-CM | POA: Diagnosis not present

## 2020-04-10 LAB — GLUCOSE, CAPILLARY
Glucose-Capillary: 156 mg/dL — ABNORMAL HIGH (ref 70–99)
Glucose-Capillary: 177 mg/dL — ABNORMAL HIGH (ref 70–99)
Glucose-Capillary: 148 mg/dL — ABNORMAL HIGH (ref 70–99)
Glucose-Capillary: 164 mg/dL — ABNORMAL HIGH (ref 70–99)

## 2020-04-10 NOTE — Progress Notes (Signed)
   Subjective:   Ms.Ellenburg was examined and evaluated at bedside this am. AAOx2 to name, year, age. Thinks location is [facility]. Denies any acute complaints. Mentions that she continues to endorse visual hallucinations of fish in her room that is distressing at night. Denies auditory hallucinations   Objective:  Vital signs in last 24 hours: Vitals:   04/09/20 1640 04/09/20 2037 04/09/20 2038 04/10/20 0416  BP: 103/87 94/78 (!) 123/57 112/89  Pulse: 63 61 (!) 57 (!) 56  Resp: 17 18  20   Temp: 98.4 F (36.9 C) 98.9 F (37.2 C)  99.2 F (37.3 C)  TempSrc:  Oral  Oral  SpO2: 97% 100% 100% 100%  Weight:        Physical Exam Constitutional: Resting comfortably, attention is better than prior day Head: atraumatic ENT: external ears normal Eyes: Closed Cardiovascular: regular rate and rhythm, normal heart sounds Pulmonary: effort normal, lungs clear to ascultation bilaterally Abdominal: flat, normal bowel sounds Skin: warm and dry Neurological: alert and oriented x2, tremulousness improved mildly Psychiatric: continue to have visual but not auditory hallucinations  Assessment/Plan: Diya Gervasi is a 65 y.o. female with hx of schizoaffective disorder with paranoia, HTN, HLD, DM, depression presenting with AKI which has improved and schizoaffective disorder, pending placement in SNF with close outpatient psych f/u.  Principal Problem:   Paranoid schizophrenia, chronic condition with acute exacerbation ([facility]) Active Problems:   AKI (acute kidney injury) ([facility])   DM (diabetes mellitus), type 2 ([facility]CC)   HTN (hypertension), benign   HLD (hyperlipidemia)   Acute kidney injury ([facility]CC)   Hallucinations   Delirium   Anemia   Malnutrition of moderate degree   # Schizoaffective disorder After stopping or reducing dose of many of her psychotropic medications, her tremors and mental status has improved. She is probably close to her baseline, with continued visual hallucinations which  are not bothering her. Rested well last night. -Psychiatry consulted, greatly appreciate recommendations -Continue Zoloft 50 mg -Ativan 1 milligram at nighttime as needed -Melatonin 3 mg and trazodone at night -no antipsychotic currently  # Drug-induced extrapyramidal movement disorder Mild kinetic tremor which is improving. -Benztropine to 0.5mg  daily    # AKI on CKD3a -resolved Creatinine of 1.42 most recently which is her baseline. FENa of 0.6% so likely had pre-renal etiology.  -Holding lisinopril -Avoid nephrotoxic agents  -Encourage PO intake    # Physical Deconditioning In the setting of prolonged hospitalization - Continue working with PT/OT   Diet:  regular IVF:  none VTE:  lovenox Prior to Admission Living Arrangement:  home Anticipated Discharge Location:  SNF Barriers to Discharge:  Medically ready for discharge Dispo: SNF when bed is obtained  03-21-2006, MD 04/10/2020, 6:25 AM Pager: [phone] After 5pm on weekdays and 1pm on weekends: On Call pager [phone]

## 2020-04-10 NOTE — Progress Notes (Signed)
Nutrition Follow-up  DOCUMENTATION CODES:   Non-severe (moderate) malnutrition in context of chronic illness  INTERVENTION:   Continue Ensure Enlive/Plus po TID, each supplement provides 350 kcal and 20 grams of protein (Ensure Plus only has 13 grams of protein per serving)  Continue MVI daily  Continue Magic cup TID with meals, each supplement provides 290 kcal and 9 grams of protein  NUTRITION DIAGNOSIS:   Moderate Malnutrition related to chronic illness (schizoaffective disorder) as evidenced by mild fat depletion,moderate muscle depletion,mild muscle depletion.  updated  GOAL:   Patient will meet greater than or equal to 90% of their needs  progressing  MONITOR:   PO intake,Supplement acceptance,Labs,Weight trends,I & O's  REASON FOR ASSESSMENT:   LOS    ASSESSMENT:   Pt admitted with AKI (now resolved) and schizoaffective disorder. PMH includes schizoaffective disorder with paranoia, HTN, HLD, DM, and depression.  Pt continues to be pending d/c to SNF.   RN in room assisting pt with meal tray at time of RD visit. Pt's appetite has significantly improved since last RD assessment. Meal completions charted as 25-100% x last 8 recorded meals (65% average meal intake). Per RN, pt continues to do well with supplements -- Ensure TID and Magic Cup TID. Will continue with current nutrition plan of care.   UOP: documented x 24hours  Labs: CBGs 156-177 Medications:ss novolog TID w/ meals, mvi with minerals, miralax, senokot  Diet Order:   Diet Order            Diet regular Room service appropriate? Yes; Fluid consistency: Thin  Diet effective now                 EDUCATION NEEDS:   Not appropriate for education at this time  Skin:  Skin Assessment: Reviewed RN Assessment  Last BM:  04/09/20 type 5  Height:   Ht Readings from Last 1 Encounters:  02/09/18 5\' 2"  (1.575 m)    Weight:   Wt Readings from Last 1 Encounters:  04/08/20 72.6 kg    BMI:   Body mass index is 29.26 kg/m.  Estimated Nutritional Needs:   Kcal:  1700-1900  Protein:  85-95 grams  Fluid:  >/=1.7L/d    06/06/20, MS, RD, LDN RD pager number and weekend/on-call pager number located in [facility].

## 2020-04-10 NOTE — Progress Notes (Signed)
Physical Therapy Treatment Patient Details Name: Jessica Campbell MRN: [id number] DOB: Nov 21, 1955 Today's Date: 04/10/2020    History of Present Illness Jessica Campbell is a 65 y.o. with PMH of schizoaffective disorder with paranoia, HTN, HLD, DM, depression admitted for hallucinations and durg induced extrapyramidal movement disorder.    PT Comments    Patient received in recliner, very pleasant and more alert today. Able to mobilize with Min-ModAx2 for safety and balance due to posterior bias/lean and general weakness. Able to practice multiple sit to stand transfers from difference height surfaces, and also able to get back to some walking today, although she fatigued very easily this morning. Had a couple of visual hallucinations during our session but was easily redirected. Left up in recliner with all needs met, chair alarm active.     Follow Up Recommendations  SNF     Equipment Recommendations  Wheelchair cushion (measurements PT);Wheelchair (measurements PT);3in1 (PT) (possible TIS WC)    Recommendations for Other Services       Precautions / Restrictions Precautions Precautions: Fall Precaution Comments: Visual and auditory hallucinations, multidirectional and unpredictable balance loss. Restrictions Weight Bearing Restrictions: No    Mobility  Bed Mobility               General bed mobility comments: up in chair upon entry  Transfers Overall transfer level: Needs assistance Equipment used: 2 person hand held assist Transfers: Sit to/from Stand Sit to Stand: Min assist;+2 physical assistance         General transfer comment: MinAx2 to power into standing and gain balance due to posterior lean/bias but did better bringing weight anteriorly today. Able to perform transfers off of multiple surfaces of different heights with consistent levels of physical assist  Ambulation/Gait Ambulation/Gait assistance: Mod assist;+2 physical assistance Gait Distance  (Feet): 40 Feet Assistive device: 2 person hand held assist Gait Pattern/deviations: Step-through pattern;Decreased stride length;Drifts right/left;Scissoring;Narrow base of support Gait velocity: decreased   General Gait Details: needed ModA of 2 persons to maintain balance due to narrow BOS and scissoring, impaired LE proprioception and posterior bias. easily fatigued and balance worsened as fatigue increased   Stairs             Wheelchair Mobility    Modified Rankin (Stroke Patients Only)       Balance Overall balance assessment: Needs assistance Sitting-balance support: Feet supported;Bilateral upper extremity supported Sitting balance-Leahy Scale: Fair   Postural control: Posterior lean Standing balance support: Bilateral upper extremity supported;During functional activity Standing balance-Leahy Scale: Poor Standing balance comment: ModAx2 for standing balance                            Cognition Arousal/Alertness: Awake/alert Behavior During Therapy: Impulsive;Restless Overall Cognitive Status: Impaired/Different from baseline Area of Impairment: Problem solving;Awareness;Safety/judgement;Following commands;Attention;Orientation;Memory                 Orientation Level: Disoriented to;Place;Situation;Time Current Attention Level: Sustained Memory: Decreased recall of precautions;Decreased short-term memory Following Commands: Follows one step commands inconsistently;Follows one step commands with increased time Safety/Judgement: Decreased awareness of safety;Decreased awareness of deficits Awareness: Intellectual Problem Solving: Slow processing;Requires verbal cues;Requires tactile cues;Difficulty sequencing General Comments: more awake and alert today, did have a couple of visual hallucinations during session (thought electrical cords were snakes at one point, and another time kept pointing to the corner looking scared) but followed cues  well. Impulsive still.      Exercises  General Comments        Pertinent Vitals/Pain Pain Assessment: Faces Faces Pain Scale: No hurt Pain Intervention(s): Limited activity within patient's tolerance;Monitored during session    Home Living                      Prior Function            PT Goals (current goals can now be found in the care plan section) Acute Rehab PT Goals Patient Stated Goal: To sit in recliner. PT Goal Formulation: With patient Time For Goal Achievement: 04/17/20 Potential to Achieve Goals: Fair Progress towards PT goals: Progressing toward goals    Frequency    Min 2X/week      PT Plan Current plan remains appropriate    Co-evaluation              AM-PAC PT "6 Clicks" Mobility   Outcome Measure  Help needed turning from your back to your side while in a flat bed without using bedrails?: A Lot Help needed moving from lying on your back to sitting on the side of a flat bed without using bedrails?: A Lot Help needed moving to and from a bed to a chair (including a wheelchair)?: A Lot Help needed standing up from a chair using your arms (e.g., wheelchair or bedside chair)?: A Little Help needed to walk in hospital room?: A Lot Help needed climbing 3-5 steps with a railing? : Total 6 Click Score: 12    End of Session Equipment Utilized During Treatment: Gait belt Activity Tolerance: Patient tolerated treatment well Patient left: in chair;with call bell/phone within reach;with chair alarm set Nurse Communication: Mobility status PT Visit Diagnosis: Unsteadiness on feet (R26.81);History of falling (Z91.81);Other abnormalities of gait and mobility (R26.89)     Time: 5183-3582 PT Time Calculation (min) (ACUTE ONLY): 23 min  Charges:  $Gait Training: 8-22 mins (co-tx with OT)                     Jessica Campbell, DPT, PN1   Supplemental Physical Therapist [place]    Pager [phone] Acute Rehab Office  [phone]

## 2020-04-10 NOTE — Discharge Summary (Addendum)
Name: Jessica Campbell MRN: [id number] DOB: 10-10-1955 65 y.o. PCP: [facility]  Date of Admission: 02/27/2020  3:43 PM Date of Discharge:  04/16/20 Attending Physician: Axel Filler, *   Discharge Diagnosis: 1. Schizoaffective disorder 2. Drug-induced extraparametal movement disorder 3. AKI on CKD 4. Physical deconditioning  Discharge Medications: Allergies as of 04/16/2020      Reactions   Paliperidone    Other reaction(s): Hives / Skin Rash   Quetiapine Fumarate Rash   Other reaction(s): Hives / Skin Rash   Risperidone Rash   Other reaction(s): Hives / Skin Rash   Diphenhydramine Hcl    Other reaction(s): Difficulty breathing, Hives / Skin Rash   Latex Rash   Other reaction(s): Rash, Rash Other reaction(s): DERMATITIS Other reaction(s): DERMATITIS      Medication List    STOP taking these medications   amantadine 100 MG capsule Commonly known as: SYMMETREL   ARIPiprazole 10 MG tablet Commonly known as: ABILIFY   insulin detemir 100 UNIT/ML injection Commonly known as: LEVEMIR   lisinopril 20 MG tablet Commonly known as: ZESTRIL     TAKE these medications   aspirin EC 81 MG tablet Take 81 mg by mouth daily. Swallow whole.   atorvastatin 40 MG tablet Commonly known as: LIPITOR Take 40 mg by mouth every evening.   benztropine 0.5 MG tablet Commonly known as: COGENTIN Take 1 tablet (0.5 mg total) by mouth daily. Take 1mg  in the morning and 0.5mg  at bedtime. What changed:   medication strength  how much to take  when to take this   hydrochlorothiazide 25 MG tablet Commonly known as: HYDRODIURIL Take 25 mg by mouth daily.   Januvia 50 MG tablet Generic drug: sitaGLIPtin Take 50 mg by mouth daily.   LORazepam 1 MG tablet Commonly known as: ATIVAN Take 1 tablet (1 mg total) by mouth at bedtime as needed (for severe agitation).   melatonin 3 MG Tabs tablet Take 3 mg by mouth at bedtime.   metFORMIN 500 MG 24 hr  tablet Commonly known as: GLUCOPHAGE-XR Take 500 mg by mouth 2 (two) times daily.   omeprazole 20 MG capsule Commonly known as: PRILOSEC Take 20 mg by mouth daily.   sertraline 50 MG tablet Commonly known as: ZOLOFT Take 75 mg by mouth daily.   traZODone 50 MG tablet Commonly known as: DESYREL Take 50 mg by mouth at bedtime as needed for sleep.   Vitamin D3 1.25 MG (50000 UT) Tabs Take 5,000 Units by mouth every Sunday.       Disposition and follow-up:   [name] was discharged from [facility] in Stable condition.  At the hospital follow up visit please address:  1.  Follow up: . Schizoaffective disorder- At this time has active hallucinations, but no command hallucinations, SI, or HI.  Not on any antipsychotic. See hospital course for other psych meds. Follow-up with psychiatry outpatient on 1/26 and adjust as needed . AKI on CKD-baseline creatinine around 1.4, assess for continued resolution  2.  Labs / imaging needed at time of follow-up: BMP  3.  Pending labs/ test needing follow-up: None  Follow-up Appointments:  Follow-up Information    Merian Capron, MD. Go on 05/02/2021.   Specialty: Psychiatry Why: @1 ' o clock: It's a virtual appointment Contact information: [address] [place] [address] [phone]               Hospital Course by problem list:  Schizoaffective  disorder Drug-induced extrapyramidal disorder Presented with active hallucinations.  Recent outpatient change medication regimen from fluphenazine to Abilify.  Was changed to Zyprexa during this admission with initially good control, but patient subsequently had worsening mental status and tremors.  We reduce or stopped most of her psychiatric medications with guidance from psychiatry and had marked improvement in mental status and movement as well.  She continues to have visual and auditory hallucinations including seeing her deceased mother, but  they are not distressing.  No command hallucinations.  No SI or HI.  Discharge medications: Zoloft 50 milligrams, benztropine 0.5 mg, trazodone 100 mg at night, melatonin at night, and Ativan 1mg  as needed for sleep, which she has required periodically during this hospitalization.  Patient is safe to discharge to home with [facility] services, home heath, and outpatient psych follow up.  AKI on CKD 3A Creatinine of 2.31 on admission which was felt to be prerenal.  Had a renal ultrasound which was negative.  This resolved with hydration.  Had another rising creatinine later during hospitalization and FENa was 0.6%, most likely also prerenal.  This resolved with hydration as well.  Discharge Vitals:   BP (!) 122/59 (BP Location: Right Arm)   Pulse 73   Temp (!) 97.3 F (36.3 C) (Oral)   Resp 16   Wt 79.8 kg   SpO2 100%   BMI 32.19 kg/m   Pertinent Labs, Studies, and Procedures:  RENAL  Result Date: 02/28/2020 CLINICAL DATA:  Acute kidney injury EXAM: RENAL / URINARY TRACT ULTRASOUND COMPLETE COMPARISON:  09/20/2019 FINDINGS: Right Kidney: Renal measurements: 9.0 x 3.5 x 4.7 cm = volume: 78 mL. Mildly increased renal cortical echogenicity. No mass or hydronephrosis visualized. Left Kidney: Renal measurements: 9.5 x 4.9 x 4.2 cm = volume: 102 mL. Mildly increased renal cortical echogenicity. No mass or hydronephrosis visualized. Bladder: Appears normal for degree of bladder distention. Other: None. IMPRESSION: 1. No evidence of obstructive uropathy. 2. Mildly increased renal cortical echogenicity bilaterally suggesting medical renal disease. Electronically Signed   By: 09/22/2019 D.O.   On: 02/28/2020 11:57   Creatinine, Ser  Date/Time Value Ref Range Status  04/08/2020 04:56 AM 1.42 (H) 0.44 - 1.00 mg/dL Final  06/06/2020 29/52/8413 AM 1.82 (H) 0.44 - 1.00 mg/dL Final  24:40 02/01/2535 AM 1.76 (H) 0.44 - 1.00 mg/dL Final  64:40 34/74/2595 PM 1.86 (H) 0.44 - 1.00 mg/dL Final  63:87 56/43/3295  AM 1.94 (H) 0.44 - 1.00 mg/dL Final  18:84 16/60/6301 AM 1.73 (H) 0.44 - 1.00 mg/dL Final  60:10 93/23/5573 AM 1.48 (H) 0.44 - 1.00 mg/dL Final  22:02 54/27/0623 AM 1.40 (H) 0.44 - 1.00 mg/dL Final  76:28 31/51/7616 AM 1.45 (H) 0.44 - 1.00 mg/dL Final  07:37 10/62/6948 AM 1.43 (H) 0.44 - 1.00 mg/dL Final  54:62 70/35/0093 AM 1.62 (H) 0.44 - 1.00 mg/dL Final  81:82 99/37/1696 AM 1.52 (H) 0.44 - 1.00 mg/dL Final  78:93 81/04/7508 AM 1.53 (H) 0.44 - 1.00 mg/dL Final  25:85 27/78/2423 AM 1.47 (H) 0.44 - 1.00 mg/dL Final  53:61 44/31/5400 AM 1.76 (H) 0.44 - 1.00 mg/dL Final  86:76 19/50/9326 PM 2.23 (H) 0.44 - 1.00 mg/dL Final  71:24 58/12/9831 AM 2.48 (H) 0.44 - 1.00 mg/dL Final  82:50 53/97/6734 PM 2.31 (H) 0.44 - 1.00 mg/dL Final      Discharge Instructions: Discharge Instructions    Call MD for:  difficulty breathing, headache or visual disturbances   Complete by: As directed    Call  MD for:  extreme fatigue   Complete by: As directed    Call MD for:  persistant dizziness or light-headedness   Complete by: As directed    Call MD for:  persistant nausea and vomiting   Complete by: As directed    Call MD for:  temperature >100.4   Complete by: As directed    Diet - low sodium heart healthy   Complete by: As directed    Discharge instructions   Complete by: As directed    Ms Jessica, Campbell were admitted to the hospital for hallucinations and an acute kidney injury.  The kidney injury was likely related to decreased food intake at home. Your kidney function improved with fluid and was back to baseline.  Here are your discharge instructions.  For future issues, you can contact the [facility] doctor or your psychiatrist, Dr. Gilmore Laroche. In addition, [facility] has 24/7 walk-in access. Their phone number is [phone]. The address is:  [address] [place], [place] [address]  1.  Try to drink plenty of fluids at home 2.  Follow-up with Dr. Gilmore Laroche for outpatient  psychiatry on 05/02/20.  3.  We adjusted your psychiatric medications.  Stop Abilify and amantadine.  Decrease Cogentin to 0.5 mg daily.  Continue Zoloft 50 mg daily.  We do not have you on any antipsychotics currently, Dr. Gilmore Laroche may start some when you see him 4.  She is still having hallucinations at time of discharge, but they are not giving her commands she denies suicidal or homicidal ideation.  If this changes, seek health care 5.  Stop taking your lisinopril.  Your blood pressure has been well managed without this. 6.  Stop taking your Levemir.  Your blood sugar has been well controlled on low doses.  Continue Metformin and Januvia.  Your primary care doctor may resume your Levemir if needed. 7.  Follow-up with [facility] as needed.  They are arranging services to help with your care upon discharge. 8.  For sleep, continue trazadone and melatonin nightly. I am also discharging with a few doses of Ativan if needed for severe agitation   Increase activity slowly   Complete by: As directed       Signed: Remo Lipps, MD 04/16/2020, 11:52 AM   Pager: [phone]

## 2020-04-10 NOTE — Progress Notes (Signed)
Occupational Therapy Treatment Patient Details Name: Jessica Campbell MRN: [id number] DOB: 10/30/1955 Today's Date: 04/10/2020    History of present illness Kjirsten Turbin is a 65 y.o. with PMH of schizoaffective disorder with paranoia, HTN, HLD, DM, depression admitted for hallucinations and drug induced extrapyramidal movement disorder.   OT comments  Pt progressing. Pt alert and performing ADL with increased alertness and increased command follow today. Pt slightly impulsive and requires modA +2 for balance at times. Pt standing at sink with minA for balance for grooming. Pt following 75% of commands today. Pt would benefit from continued OT skilled services for ADL, mobility and safety in SNF setting. OT following acutely.   Follow Up Recommendations  SNF    Equipment Recommendations  3 in 1 bedside commode    Recommendations for Other Services      Precautions / Restrictions Precautions Precautions: Fall Precaution Comments: Visual and auditory hallucinations, multidirectional and unpredictable balance loss. Restrictions Weight Bearing Restrictions: No       Mobility Bed Mobility Overal bed mobility: Needs Assistance             General bed mobility comments: up in chair upon entry  Transfers Overall transfer level: Needs assistance Equipment used: 2 person hand held assist Transfers: Sit to/from Stand Sit to Stand: Min assist;+2 physical assistance         General transfer comment: MinA +2 for power-up    Balance Overall balance assessment: Needs assistance Sitting-balance support: Feet supported;Bilateral upper extremity supported Sitting balance-Leahy Scale: Fair   Postural control: Posterior lean Standing balance support: Bilateral upper extremity supported;During functional activity Standing balance-Leahy Scale: Poor Standing balance comment: ModAx2 for standing balance                           ADL either performed or assessed with  clinical judgement   ADL Overall ADL's : Needs assistance/impaired Eating/Feeding: Set up;Sitting   Grooming: Minimal assistance;Standing                   Toilet Transfer: Minimal assistance;Ambulation;Regular Teacher, adult education Details (indicate cue type and reason): +2 for ambulation to commode Toileting- Clothing Manipulation and Hygiene: Minimal assistance;Sitting/lateral lean Toileting - Clothing Manipulation Details (indicate cue type and reason): assist in standing for task to ensure proper pericare.     Functional mobility during ADLs: Moderate assistance;+2 for physical assistance;+2 for safety/equipment;Cueing for safety General ADL Comments: Pt minA+2 to modA+2 at times for redirection, balance and to right reactions with small balance challenges. Pt unaware to hold onto sink with single UE.     Vision       Perception     Praxis      Cognition Arousal/Alertness: Awake/alert Behavior During Therapy: Impulsive;Restless Overall Cognitive Status: Impaired/Different from baseline Area of Impairment: Problem solving;Awareness;Safety/judgement;Following commands;Attention;Orientation;Memory                 Orientation Level: Disoriented to;Place;Situation;Time Current Attention Level: Sustained Memory: Decreased recall of precautions;Decreased short-term memory Following Commands: Follows one step commands inconsistently;Follows one step commands with increased time Safety/Judgement: Decreased awareness of safety;Decreased awareness of deficits Awareness: Intellectual Problem Solving: Slow processing;Requires verbal cues;Requires tactile cues;Difficulty sequencing General Comments: Pt alert and performing ADL with increased alertness and increased command follow today. Pt slightly impulsive and requires modA +2 for balance at times. Pt following 75% of commands today.        Exercises     Shoulder Instructions  General Comments       Pertinent Vitals/ Pain       Pain Assessment: No/denies pain Faces Pain Scale: No hurt Pain Intervention(s): Limited activity within patient's tolerance;Monitored during session  Home Living                                          Prior Functioning/Environment              Frequency  Min 2X/week        Progress Toward Goals  OT Goals(current goals can now be found in the care plan section)  Progress towards OT goals: Progressing toward goals  Acute Rehab OT Goals Patient Stated Goal: To sit in recliner. OT Goal Formulation: With patient/family Time For Goal Achievement: 04/24/20 Potential to Achieve Goals: Good ADL Goals Pt Will Perform Lower Body Dressing: sit to/from stand;with supervision Pt Will Perform Toileting - Clothing Manipulation and hygiene: sit to/from stand;with supervision Pt Will Perform Tub/Shower Transfer: grab bars Additional ADL Goal #1: pt will demonstrate 2 step command 2 out 4 attempts  Plan Discharge plan remains appropriate    Co-evaluation    PT/OT/SLP Co-Evaluation/Treatment: Yes Reason for Co-Treatment: For patient/therapist safety;Necessary to address cognition/behavior during functional activity   OT goals addressed during session: ADL's and self-care      AM-PAC OT "6 Clicks" Daily Activity     Outcome Measure   Help from another person eating meals?: A Little Help from another person taking care of personal grooming?: A Lot Help from another person toileting, which includes using toliet, bedpan, or urinal?: Total Help from another person bathing (including washing, rinsing, drying)?: Total Help from another person to put on and taking off regular upper body clothing?: A Lot Help from another person to put on and taking off regular lower body clothing?: A Lot 6 Click Score: 11    End of Session Equipment Utilized During Treatment: Gait belt;Rolling walker  OT Visit Diagnosis: Unsteadiness on feet  (R26.81);History of falling (Z91.81);Other symptoms and signs involving cognitive function   Activity Tolerance Patient tolerated treatment well   Patient Left in chair;with call bell/phone within reach;with chair alarm set   Nurse Communication Mobility status        Time: 9379-0240 OT Time Calculation (min): 24 min  Charges: OT General Charges $OT Visit: 1 Visit OT Treatments $Self Care/Home Management : 8-22 mins  Flora Lipps, OTR/L Acute Rehabilitation Services Pager: [phone] Office: [phone]    Aaren Atallah C 04/10/2020, 3:51 PM

## 2020-04-11 DIAGNOSIS — E119 Type 2 diabetes mellitus without complications: Secondary | ICD-10-CM | POA: Diagnosis not present

## 2020-04-11 DIAGNOSIS — N179 Acute kidney failure, unspecified: Secondary | ICD-10-CM | POA: Diagnosis not present

## 2020-04-11 DIAGNOSIS — G2570 Drug induced movement disorder, unspecified: Secondary | ICD-10-CM | POA: Diagnosis not present

## 2020-04-11 DIAGNOSIS — F200 Paranoid schizophrenia: Secondary | ICD-10-CM | POA: Diagnosis not present

## 2020-04-11 LAB — GLUCOSE, CAPILLARY
Glucose-Capillary: 140 mg/dL — ABNORMAL HIGH (ref 70–99)
Glucose-Capillary: 166 mg/dL — ABNORMAL HIGH (ref 70–99)
Glucose-Capillary: 209 mg/dL — ABNORMAL HIGH (ref 70–99)
Glucose-Capillary: 186 mg/dL — ABNORMAL HIGH (ref 70–99)

## 2020-04-11 NOTE — Progress Notes (Signed)
   Subjective:   Ms.Brisbin was examined and evaluated at bedside this am. She mentions continuing to endorse visual hallucination with seeing a fish nearby. She continues to think we are in [facility]. Oriented to person and almost to time (thinks it is December). Denies any acute complaints.   Objective:  Vital signs in last 24 hours: Vitals:   04/10/20 0949 04/10/20 1659 04/10/20 2112 04/11/20 0449  BP: 112/87 (!) 105/49 (!) 118/55 (!) 139/48  Pulse: 83 65 65 67  Resp: 18 19 16 18   Temp: 99.2 F (37.3 C) 98.2 F (36.8 C) 99.6 F (37.6 C) 98.9 F (37.2 C)  TempSrc: Oral  Oral Oral  SpO2: 99% 98% 100% 98%  Weight:   79.8 kg     Physical Exam Constitutional: Resting comfortably Head: atraumatic ENT: external ears normal Eyes: Closed Cardiovascular: regular rate and rhythm, normal heart sounds Pulmonary: effort normal, lungs clear to ascultation bilaterally Abdominal: flat, normal bowel sounds Skin: warm and dry Neurological: alert and oriented x2, mild kinetic tremors Psychiatric: continue to have visual hallucinations  Assessment/Plan: Sharde Gover is a 65 y.o. female with hx of schizoaffective disorder with paranoia, HTN, HLD, DM, depression presenting with AKI which has improved and schizoaffective disorder, pending placement in SNF with close outpatient psych f/u.  Principal Problem:   Paranoid schizophrenia, chronic condition with acute exacerbation (HCC) Active Problems:   AKI (acute kidney injury) (HCC)   DM (diabetes mellitus), type 2 (HCC)   HTN (hypertension), benign   HLD (hyperlipidemia)   Acute kidney injury (HCC)   Hallucinations   Delirium   Anemia   Malnutrition of moderate degree   # Schizoaffective disorder After stopping or reducing dose of many of her psychotropic medications, her tremors and mental status has improved. She is probably close to her baseline, with continued visual hallucinations which are not bothering her. Rested well last  night. -Psychiatry consulted, greatly appreciate recommendations -Continue Zoloft 50 mg -Ativan 1 milligram at nighttime as needed -Melatonin 3 mg and trazodone at night -no antipsychotic currently  # Drug-induced extrapyramidal movement disorder Mild kinetic tremor which is improving. -Benztropine to 0.5mg  daily    # AKI on CKD3a -resolved Creatinine of 1.42 most recently which is her baseline. FENa of 0.6% so likely had pre-renal etiology.  -Holding lisinopril -Avoid nephrotoxic agents  -Encourage PO intake    # Physical Deconditioning In the setting of prolonged hospitalization - Continue working with PT/OT   Diet:  regular IVF:  none VTE:  lovenox Prior to Admission Living Arrangement:  home Anticipated Discharge Location:  SNF Barriers to Discharge:  Medically ready for discharge Dispo: SNF when bed is obtained  03-21-2006, MD 04/11/2020, 6:23 AM Pager: [phone] After 5pm on weekdays and 1pm on weekends: On Call pager [phone]

## 2020-04-11 NOTE — Plan of Care (Signed)
  Problem: Education: Goal: Knowledge of General Education information will improve Description Including pain rating scale, medication(s)/side effects and non-pharmacologic comfort measures Outcome: Progressing   

## 2020-04-12 DIAGNOSIS — F200 Paranoid schizophrenia: Secondary | ICD-10-CM | POA: Diagnosis not present

## 2020-04-12 DIAGNOSIS — G2570 Drug induced movement disorder, unspecified: Secondary | ICD-10-CM | POA: Diagnosis not present

## 2020-04-12 DIAGNOSIS — E119 Type 2 diabetes mellitus without complications: Secondary | ICD-10-CM | POA: Diagnosis not present

## 2020-04-12 DIAGNOSIS — N179 Acute kidney failure, unspecified: Secondary | ICD-10-CM | POA: Diagnosis not present

## 2020-04-12 LAB — GLUCOSE, CAPILLARY
Glucose-Capillary: 123 mg/dL — ABNORMAL HIGH (ref 70–99)
Glucose-Capillary: 163 mg/dL — ABNORMAL HIGH (ref 70–99)
Glucose-Capillary: 279 mg/dL — ABNORMAL HIGH (ref 70–99)
Glucose-Capillary: 112 mg/dL — ABNORMAL HIGH (ref 70–99)

## 2020-04-12 MED ORDER — LACTULOSE ENEMA
300.0000 mL | Freq: Once | ORAL | Status: DC
  Filled 2020-04-12: qty 300

## 2020-04-12 NOTE — Care Management Important Message (Signed)
Important Message  Patient Details  Name: Jessica Campbell MRN: [id number] Date of Birth: May 21, 1955   Medicare Important Message Given:  Yes     [name] 04/12/2020, 11:33 AM

## 2020-04-12 NOTE — Plan of Care (Signed)
  Problem: Skin Integrity: Goal: Risk for impaired skin integrity will decrease Outcome: Progressing   

## 2020-04-12 NOTE — TOC Progression Note (Signed)
Transition of Care [facility]) - Progression Note    Patient Details  Name: [name] MRN: [id number] Date of Birth: 10/17/1955  Transition of Care [facility]) CM/SW Contact  Bess Kinds, RN Phone Number: [phone] 04/12/2020, 8:58 AM  Clinical Narrative:     Spoke with Marylene Land, social worker at [facility], she advised to extend bed search within a 50 mile radius. Bed search extended. Spoke with Olegario Messier at [facility] - no LTC beds at this time. Spoke with Loie at [facility] - pending. Spoke with Whitney Post at [facility] - pending. TOC continuing search.      Expected Discharge Plan and Services                                                 Social Determinants of Health (SDOH) Interventions    Readmission Risk Interventions No flowsheet data found.

## 2020-04-12 NOTE — Progress Notes (Signed)
   Subjective:   Patient evaluated this morning. She is sitting comfortably in her recliner. Appears very calm, and interacts appropriately. Denies pain anywhere.   Objective:  Vital signs in last 24 hours: Vitals:   04/11/20 1001 04/11/20 1712 04/11/20 2022 04/12/20 0417  BP: 136/78 (!) 127/56 134/69 (!) 142/66  Pulse: 68 71 71 (!) 55  Resp: 18 17 20 18   Temp: 99.1 F (37.3 C) 98.8 F (37.1 C) 97.6 F (36.4 C) 98 F (36.7 C)  TempSrc: Oral Oral Oral Oral  SpO2: 100% 96% 93% 100%  Weight:        Physical Exam Constitutional: Resting comfortably Head: atraumatic ENT: external ears normal Eyes: EOMI Cardiovascular: regular rate and rhythm, normal heart sounds Pulmonary: effort normal, lungs clear to ascultation bilaterally Abdominal: flat, normal bowel sounds Skin: warm and dry Neurological: alert and oriented x2, mild kinetic tremors Psychiatric: continue to have visual hallucinations  Assessment/Plan: Jessica Campbell is a 65 y.o. female with hx of schizoaffective disorder with paranoia, HTN, HLD, DM, depression presenting with AKI which has improved and schizoaffective disorder, pending placement in SNF with close outpatient psych f/u.  Principal Problem:   Paranoid schizophrenia, chronic condition with acute exacerbation (HCC) Active Problems:   AKI (acute kidney injury) (HCC)   DM (diabetes mellitus), type 2 (HCC)   HTN (hypertension), benign   HLD (hyperlipidemia)   Acute kidney injury (HCC)   Hallucinations   Delirium   Anemia   Malnutrition of moderate degree   # Schizoaffective disorder After stopping or reducing dose of many of her psychotropic medications, her tremors and mental status has improved. She is probably close to her baseline, with continued visual hallucinations which are not bothering her. Resting well overnight, sometimes with Ativan. -Psychiatry consulted, greatly appreciate recommendations -Continue Zoloft 50 mg -Ativan 1 milligram at  nighttime as needed -Melatonin 3 mg and trazodone at night -no antipsychotic currently  # Drug-induced extrapyramidal movement disorder Mild kinetic tremor which is improving. -Benztropine to 0.5mg  daily    # Constipation Has had a number of enemas this admission. -soap suds enema today -continue miralax BID -continue Senna BID  # AKI on CKD3a -resolved Creatinine of 1.42 most recently which is her baseline. FENa of 0.6% so likely had pre-renal etiology.  -Holding lisinopril -Avoid nephrotoxic agents  -Encourage PO intake    # Physical Deconditioning In the setting of prolonged hospitalization - Continue working with PT/OT   Diet:  regular IVF:  none VTE:  lovenox Prior to Admission Living Arrangement:  home Anticipated Discharge Location:  SNF Barriers to Discharge:  Medically ready for discharge Dispo: SNF when bed is obtained  03-21-2006, MD 04/12/2020, 5:58 AM Pager: [phone] After 5pm on weekdays and 1pm on weekends: On Call pager [phone]

## 2020-04-12 NOTE — Plan of Care (Signed)
  Problem: Skin Integrity: Goal: Risk for impaired skin integrity will decrease Outcome: Progressing   Problem: Activity: Goal: Risk for activity intolerance will decrease Outcome: Progressing   

## 2020-04-13 DIAGNOSIS — N179 Acute kidney failure, unspecified: Secondary | ICD-10-CM | POA: Diagnosis not present

## 2020-04-13 DIAGNOSIS — G2570 Drug induced movement disorder, unspecified: Secondary | ICD-10-CM | POA: Diagnosis not present

## 2020-04-13 DIAGNOSIS — F200 Paranoid schizophrenia: Secondary | ICD-10-CM | POA: Diagnosis not present

## 2020-04-13 DIAGNOSIS — E119 Type 2 diabetes mellitus without complications: Secondary | ICD-10-CM | POA: Diagnosis not present

## 2020-04-13 LAB — GLUCOSE, CAPILLARY
Glucose-Capillary: 208 mg/dL — ABNORMAL HIGH (ref 70–99)
Glucose-Capillary: 163 mg/dL — ABNORMAL HIGH (ref 70–99)
Glucose-Capillary: 166 mg/dL — ABNORMAL HIGH (ref 70–99)

## 2020-04-13 MED ORDER — LORAZEPAM 1 MG PO TABS
1.0000 mg | ORAL_TABLET | Freq: Every evening | ORAL | Status: DC | PRN
  Administered 2020-04-16: 1 mg via ORAL
  Filled 2020-04-13 (×2): qty 1

## 2020-04-13 NOTE — TOC Progression Note (Signed)
Transition of Care [facility]) - Progression Note    Patient Details  Name: Jessica Campbell MRN: [id number] Date of Birth: [birth year]  Transition of Care [facility]) CM/SW Contact  Bess Kinds, RN Phone Number: [phone] 04/13/2020, 1:37 PM  Clinical Narrative:     Sherron Monday with Marylene Land at [facility] who suggested contacting [facility] in [place]. Spoke with Tiffany at [phone]. Tiffany requested patient information be faxed to [phone] for review.   Attempted to contact admissions at [facility]. Left voicemail with "receptionsist" with NCM contact information.   Left voicemail for Okey Regal, admissions coordinator, at [facility] providing NCM contact information.   Spoke with [place] at [facility]. They do not have a bed today. She will review information with administrator and follow up if able to offer a bed.   TOC following for transition needs.       Expected Discharge Plan and Services                                                 Social Determinants of Health (SDOH) Interventions    Readmission Risk Interventions No flowsheet data found.

## 2020-04-13 NOTE — TOC Progression Note (Addendum)
Transition of Care [facility]) - Progression Note    Patient Details  Name: Jessica Campbell MRN: [id number] Date of Birth: December 10, 1955  Transition of Care [facility]) CM/SW Contact  Bess Kinds, RN Phone Number: [phone] 04/13/2020, 8:35 AM  Clinical Narrative:     Sherron Monday with Marylene Land, social worker at [facility], yesterday afternoon. Patient is not in need of a LTC, but needs short-term rehab with the plan to transition back to her sister's home with ongoing PACE services. PACE will continue to follow patient while in rehab for psychiatric services.   Discussed needs with several facilities yesterday [facility], [facility], [facility], [facility], [address]) - all declined to offer a rehab bed with [facility].   Additional calls made this morning - [facility] closed for admissions per Vincent; all [facility] facilities [facility], [address], [place], [place]) declined d/t psychiatric needs and/or no bed availability or closed for admissions d/t ongoing pandemic.   TOC continuing to follow for transition needs.      Expected Discharge Plan and Services                                                 Social Determinants of Health (SDOH) Interventions    Readmission Risk Interventions No flowsheet data found.

## 2020-04-13 NOTE — Progress Notes (Signed)
Physical Therapy Treatment Patient Details Name: Jessica Campbell MRN: [id number] DOB: 08/13/1955 Today's Date: 04/13/2020    History of Present Illness Jessica Campbell is a 65 y.o. with PMH of schizoaffective disorder with paranoia, HTN, HLD, DM, depression admitted for hallucinations and drug induced extrapyramidal movement disorder.    PT Comments    Patient progressing well towards PT goals. Reports feeling better today; last hallucination was last night per report. Does not recall if she had any this morning. Tolerated transfers and gait training with min guard-Min A for balance/safety. Noted to have narrow BoS, instances of scissoring and instability during gait, but much improved than prior session. Able to stand at sink and perform ADL task with min guard- some sway noted but no overt LOB. Pt appears to be improving well. Will follow.    Follow Up Recommendations  SNF     Equipment Recommendations  3in1 (PT)    Recommendations for Other Services       Precautions / Restrictions Precautions Precautions: Fall Restrictions Weight Bearing Restrictions: No    Mobility  Bed Mobility Overal bed mobility: Needs Assistance Bed Mobility: Supine to Sit     Supine to sit: Supervision;HOB elevated     General bed mobility comments: Able to get to EOB without difficulty today with use of rail.  Transfers Overall transfer level: Needs assistance Equipment used: 1 person hand held assist Transfers: Sit to/from Stand Sit to Stand: Min guard         General transfer comment: Min guard for safety. Stood from [employer], from chair x2, transferred to chair post ambulation. Mildly unsteady.  Ambulation/Gait Ambulation/Gait assistance: Min assist Gait Distance (Feet): 120 Feet Assistive device: 1 person hand held assist Gait Pattern/deviations: Step-through pattern;Decreased stride length;Scissoring;Narrow base of support Gait velocity: decreased   General Gait Details: Slow,  mildly unsteady gait with narrow BoS and a few instances of scissoring. Difficulty dual tasking- following commands to perform while walking. Min A for balance. Practiced higher level balance challenges with some difficulty.   Stairs             Wheelchair Mobility    Modified Rankin (Stroke Patients Only)       Balance Overall balance assessment: Needs assistance Sitting-balance support: Feet supported;No upper extremity supported Sitting balance-Leahy Scale: Good Sitting balance - Comments: Able to reach down and donn slip on shoes without LOB.   Standing balance support: During functional activity Standing balance-Leahy Scale: Fair Standing balance comment: Able to stand at sink and wash face with close min guard due to some mild sway; otherwise needs Min A at times for walking.                            Cognition Arousal/Alertness: Awake/alert Behavior During Therapy: WFL for tasks assessed/performed Overall Cognitive Status: Impaired/Different from baseline Area of Impairment: Orientation;Attention;Memory;Following commands;Safety/judgement                 Orientation Level: Disoriented to;Time;Situation Current Attention Level: Sustained Memory: Decreased recall of precautions;Decreased short-term memory Following Commands: Follows one step commands consistently Safety/Judgement: Decreased awareness of safety;Decreased awareness of deficits   Problem Solving: Requires verbal cues;Slow processing General Comments: Less impulsive and follows commands well today. No hallucintations reported.      Exercises      General Comments        Pertinent Vitals/Pain Pain Assessment: No/denies pain    Home Living  Prior Function            PT Goals (current goals can now be found in the care plan section) Progress towards PT goals: Progressing toward goals    Frequency    Min 2X/week      PT Plan Current  plan remains appropriate    Co-evaluation              AM-PAC PT "6 Clicks" Mobility   Outcome Measure  Help needed turning from your back to your side while in a flat bed without using bedrails?: A Little Help needed moving from lying on your back to sitting on the side of a flat bed without using bedrails?: A Little Help needed moving to and from a bed to a chair (including a wheelchair)?: A Little Help needed standing up from a chair using your arms (e.g., wheelchair or bedside chair)?: A Little Help needed to walk in hospital room?: A Little Help needed climbing 3-5 steps with a railing? : A Little 6 Click Score: 18    End of Session Equipment Utilized During Treatment: Gait belt Activity Tolerance: Patient tolerated treatment well Patient left: in chair;with call bell/phone within reach;with chair alarm set Nurse Communication: Mobility status PT Visit Diagnosis: Unsteadiness on feet (R26.81);History of falling (Z91.81);Other abnormalities of gait and mobility (R26.89)     Time: 1030-1045 PT Time Calculation (min) (ACUTE ONLY): 15 min  Charges:  $Gait Training: 8-22 mins                     Vale Haven, PT, DPT Acute Rehabilitation Services Pager [phone] Office [phone]       Blake Divine A Lanier Ensign 04/13/2020, 12:10 PM

## 2020-04-13 NOTE — Progress Notes (Signed)
   Subjective:   Ms. Denault was sleeping soundly initially but woke up on morning rounds. Denies any acute complaints today including pain. She is still seeing a fish swimming around.  She is unable to answer questions regarding location or date.    Objective:  Vital signs in last 24 hours: Vitals:   04/12/20 0417 04/12/20 0931 04/12/20 1658 04/12/20 2109  BP: (!) 142/66 (!) 111/94 (!) 121/111 (!) 131/57  Pulse: (!) 55 65 99 (!) 59  Resp: 18 18 18 20   Temp: 98 F (36.7 C) 98.8 F (37.1 C) 98.8 F (37.1 C) 98.5 F (36.9 C)  TempSrc: Oral Oral Oral Oral  SpO2: 100% 97% 100% 100%  Weight:        Physical Exam Constitutional: Resting comfortably Head: atraumatic ENT: external ears normal Eyes: EOMI Cardiovascular: regular rate and rhythm, normal heart sounds Pulmonary: effort normal, lungs clear to ascultation bilaterally Abdominal: flat, normal bowel sounds Skin: warm and dry Neurological: sleeping initially but alert upon waking, oriented to place, not oriented to place, oriented to month with prompting Psychiatric: continue to have visual hallucinations  Assessment/Plan: Ala Kratz is a 65 y.o. female with hx of schizoaffective disorder with paranoia, HTN, HLD, DM, depression presenting with AKI which has improved and schizoaffective disorder, pending placement in SNF with close outpatient psych f/u.  Principal Problem:   Paranoid schizophrenia, chronic condition with acute exacerbation (HCC) Active Problems:   AKI (acute kidney injury) (HCC)   DM (diabetes mellitus), type 2 (HCC)   HTN (hypertension), benign   HLD (hyperlipidemia)   Acute kidney injury (HCC)   Hallucinations   Delirium   Anemia   Malnutrition of moderate degree   # Schizoaffective disorder After stopping or reducing dose of many of her psychotropic medications, her tremors and mental status has improved. She is probably close to her baseline, with continued visual hallucinations which are  not bothering her. Resting well overnight, sometimes with Ativan. -Psychiatry signed off, appreciate assistance -Continue Zoloft 50 mg -Ativan 1 milligram at nighttime as needed, not required recently -Melatonin 3 mg and trazodone at night -no antipsychotic currently  # Drug-induced extrapyramidal movement disorder Mild kinetic tremor which is improving. -Benztropine to 0.5mg  daily    # Constipation Has had a number of enemas this admission. Had good BM yesterday with lactulose enema -continue miralax BID -continue Senna BID -further enemas as needed, not Fleet's with CKD   # Physical Deconditioning In the setting of prolonged hospitalization - Continue working with PT/OT   # AKI on CKD3a -resolved  Diet:  regular IVF:  none VTE:  lovenox Prior to Admission Living Arrangement:  home Anticipated Discharge Location:  SNF Barriers to Discharge:  Medically ready for discharge Dispo: SNF when bed is obtained  03-21-2006, MD 04/13/2020, 5:51 AM Pager: [phone] After 5pm on weekdays and 1pm on weekends: On Call pager [phone]

## 2020-04-14 DIAGNOSIS — I129 Hypertensive chronic kidney disease with stage 1 through stage 4 chronic kidney disease, or unspecified chronic kidney disease: Secondary | ICD-10-CM | POA: Diagnosis not present

## 2020-04-14 DIAGNOSIS — N1831 Chronic kidney disease, stage 3a: Secondary | ICD-10-CM | POA: Diagnosis not present

## 2020-04-14 DIAGNOSIS — N179 Acute kidney failure, unspecified: Secondary | ICD-10-CM | POA: Diagnosis not present

## 2020-04-14 DIAGNOSIS — F200 Paranoid schizophrenia: Secondary | ICD-10-CM | POA: Diagnosis not present

## 2020-04-14 LAB — GLUCOSE, CAPILLARY
Glucose-Capillary: 138 mg/dL — ABNORMAL HIGH (ref 70–99)
Glucose-Capillary: 174 mg/dL — ABNORMAL HIGH (ref 70–99)
Glucose-Capillary: 150 mg/dL — ABNORMAL HIGH (ref 70–99)
Glucose-Capillary: 163 mg/dL — ABNORMAL HIGH (ref 70–99)

## 2020-04-14 NOTE — Progress Notes (Signed)
   Subjective:   Patient reports feeling well this morning.  She denies having any pain.  She states that she did not sleep well because she heard a baby crying.  She felt that a lady was also coming in and out of her room and she felt that she kept seeing a black shadow.  She denies still visualizing any fish in the room.  She states her sister is going to come visit her tomorrow.  Objective:  Vital signs in last 24 hours: Vitals:   04/12/20 2109 04/13/20 0947 04/13/20 1742 04/13/20 2037  BP: (!) 131/57 94/82 (!) 106/45 (!) 107/54  Pulse: (!) 59 85 (!) 58 64  Resp: 20 17 18 19   Temp: 98.5 F (36.9 C) 98 F (36.7 C) 98.2 F (36.8 C) 99.4 F (37.4 C)  TempSrc: Oral   Oral  SpO2: 100% 94% 100% 98%  Weight:       General- resting comfortably on the chair, no acute distress Lung- normal effort of breathing, lungs clear to auscultation bilaterally CV-regular rate and rhythm, no murmurs Skin-warm and dry Psych- continuing to have visual and auditory hallucinations  Assessment/Plan:  Principal Problem:   Paranoid schizophrenia, chronic condition with acute exacerbation (HCC) Active Problems:   AKI (acute kidney injury) (HCC)   DM (diabetes mellitus), type 2 (HCC)   HTN (hypertension), benign   HLD (hyperlipidemia)   Acute kidney injury (HCC)   Hallucinations   Delirium   Anemia   Malnutrition of moderate degree  Jessica Campbell is a 65 y.o. female with hx of schizoaffective disorder with paranoia, HTN, HLD, DM, depression who presented with AKI which has improved and schizoaffective disorder. Plan was her to be discharged to SNF; however, the plan is now to discharge home with PACE services MWF and [facility] services the remaining days.  Schizoaffective disorder After stopping or reducing dose of many of her psychotropic medications, her tremors and mental status has improved. She is probably close to her baseline, with continued visual and auditory hallucinations which are not  bothering her. Will need to re-consult pyschiatry to determine her medication regimen for discharge. P -Psychiatry signed off, appreciate assistance -Continue Zoloft 50 mg -Ativan 1 milligram at nighttime as needed, not required recently -Melatonin 3 mg and trazodone at night -no antipsychotic currently  Drug-induced extrapyramidal movement disorder Mild kinetic tremor which is improving. -Benztropine to 0.5mg  daily   Constipation -continue miralax BID -continue Senna BID -further enemas as needed, not Fleet's with CKD  Physical Deconditioning In the setting of prolonged hospitalization - Continue working with PT/OT    Prior to Admission Living Arrangement: Home Anticipated Discharge Location: Plan is to discharge to home with PACE services, home health and family support. She will be going to her sister's home with PACE services MWF and home health the remaining days. Barriers to Discharge: Setting up PACE services and home health  Dispo: Anticipated discharge is in approximately 2 days. Patient is medically safe for discharge home once PACE and [facility] services are set up.   [facility], DO 04/14/2020, 6:36 AM Pager: [phone] After 5pm on weekdays and 1pm on weekends: On Call pager [phone]

## 2020-04-14 NOTE — Progress Notes (Signed)
Patient peripheral IV was removed IV leaking. MD is aware.

## 2020-04-14 NOTE — Plan of Care (Signed)
  Problem: Activity: Goal: Risk for activity intolerance will decrease Outcome: Progressing   

## 2020-04-15 LAB — GLUCOSE, CAPILLARY
Glucose-Capillary: 149 mg/dL — ABNORMAL HIGH (ref 70–99)
Glucose-Capillary: 276 mg/dL — ABNORMAL HIGH (ref 70–99)
Glucose-Capillary: 195 mg/dL — ABNORMAL HIGH (ref 70–99)
Glucose-Capillary: 130 mg/dL — ABNORMAL HIGH (ref 70–99)

## 2020-04-15 NOTE — Plan of Care (Signed)
  Problem: Health Behavior/Discharge Planning: Goal: Ability to manage health-related needs will improve Outcome: Progressing   

## 2020-04-15 NOTE — Progress Notes (Addendum)
   Subjective:   Patient alert this morning, smiling and interactive.  Denies any acute complaints.  Discussed plan for discharge to home with PT services and home health, she is very excited to hear this.  Continues to have visual and auditory hallucinations.  Objective:  Vital signs in last 24 hours: Vitals:   04/13/20 2037 04/14/20 0952 04/14/20 1657 04/14/20 2002  BP: (!) 107/54 107/71 109/66 (!) 131/98  Pulse: 64 60 65 98  Resp: 19 16 16 17   Temp: 99.4 F (37.4 C) 98.2 F (36.8 C) 98.6 F (37 C) 99.6 F (37.6 C)  TempSrc: Oral   Oral  SpO2: 98% 100% 100% 98%  Weight:        Physical Exam Constitutional: Sitting comfortably in recliner Head: atraumatic ENT: external ears normal Eyes: EOMI Cardiovascular: regular rate and rhythm, normal heart sounds Pulmonary: effort normal Abdominal: flat Skin: warm and dry Neurological:  Alert, oriented to person and time but not to place Psychiatric: continue to have visual and auditory hallucinations, denies SI or HI  Assessment/Plan:  Principal Problem:   Paranoid schizophrenia, chronic condition with acute exacerbation (HCC) Active Problems:   AKI (acute kidney injury) (HCC)   DM (diabetes mellitus), type 2 (HCC)   HTN (hypertension), benign   HLD (hyperlipidemia)   Acute kidney injury (HCC)   Hallucinations   Delirium   Anemia   Malnutrition of moderate degree  Jessica Campbell is a 65 y.o. female with hx of schizoaffective disorder with paranoia, HTN, HLD, DM, depression who presented with AKI which has improved and schizoaffective disorder.  Plan for discharge home with her sister with PACE services MWF and [facility] services the remaining days, along with family support.  Schizoaffective disorder After stopping or reducing dose of many of her psychotropic medications, her tremors and mental status has improved. She is probably close to her baseline, with continued visual and auditory hallucinations which are not bothering  her.  Denies SI or HI. -Spoke with psychiatry regarding discharge medications.  They affirmed that she does not need to be on antipsychotic at this time without command hallucinations, SI, or HI.  Complete resolution of hallucinations is not necessarily expected.  Recommend to continue current medications on discharge. -Continue Zoloft 50 mg -Ativan 1 milligram at nighttime as needed, not required recently -Melatonin 3 mg and trazodone at night -no antipsychotic currently -Follow-up with Dr. [facility] outpatient with behavioral health on 05/02/2020  Drug-induced extrapyramidal movement disorder Mild kinetic tremor which has improved greatly since psych med changes. -Benztropine to 0.5mg  daily   Constipation -continue miralax BID -continue Senna BID -further enemas as needed, not Fleet's with CKD  Physical Deconditioning In the setting of prolonged hospitalization -Continue working with PT/OT    Prior to Admission Living Arrangement: Home Anticipated Discharge Location: Plan is to discharge to home with PACE services, home health, family support, and outpatient Geri psych follow-up. She will be going to her sister's home with PACE services MWF and home health the remaining days. Barriers to Discharge: Setting up PACE services and home health  Dispo: Anticipated discharge is in approximately 1 day. Patient is medically safe for discharge home once PACE and [facility] services are set up.   [facility], MD 04/15/2020, 6:03 AM Pager: [phone] After 5pm on weekdays and 1pm on weekends: On Call pager [phone]

## 2020-04-16 DIAGNOSIS — N179 Acute kidney failure, unspecified: Secondary | ICD-10-CM | POA: Diagnosis not present

## 2020-04-16 DIAGNOSIS — I129 Hypertensive chronic kidney disease with stage 1 through stage 4 chronic kidney disease, or unspecified chronic kidney disease: Secondary | ICD-10-CM | POA: Diagnosis not present

## 2020-04-16 DIAGNOSIS — F200 Paranoid schizophrenia: Secondary | ICD-10-CM | POA: Diagnosis not present

## 2020-04-16 DIAGNOSIS — N1831 Chronic kidney disease, stage 3a: Secondary | ICD-10-CM | POA: Diagnosis not present

## 2020-04-16 LAB — GLUCOSE, CAPILLARY
Glucose-Capillary: 160 mg/dL — ABNORMAL HIGH (ref 70–99)
Glucose-Capillary: 203 mg/dL — ABNORMAL HIGH (ref 70–99)

## 2020-04-16 MED ORDER — LORAZEPAM 1 MG PO TABS: 1.0000 mg | ORAL_TABLET | Freq: Every evening | ORAL | 0 refills | Status: AC | PRN

## 2020-04-16 MED ORDER — BENZTROPINE MESYLATE 0.5 MG PO TABS: 0.5000 mg | ORAL_TABLET | Freq: Every day | ORAL | 0 refills | Status: AC

## 2020-04-16 NOTE — Progress Notes (Signed)
Physical Therapy Treatment Patient Details Name: Jessica Campbell MRN: [id number] DOB: 1955/08/25 Today's Date: 04/16/2020    History of Present Illness Jessica Campbell is a 65 y.o. with PMH of schizoaffective disorder with paranoia, HTN, HLD, DM, depression admitted for hallucinations and drug induced extrapyramidal movement disorder.    PT Comments    Patient received in recliner, very pleasant and willing to participate with therapy, care team now reports plan is to return home. The sister that she will be going home with was present and observed the session. Able to progress gait distance generally with MinA for balance/safety due to ongoing scissoring pattern/narrow BOS, still with unpredictable balance loss. Introduced Museum/gallery curator today with U railing and contralateral HHA- needed heavy ModA of one person due to increased extensor tone/posterior lean with stair navigation. Also educated and had sister practice guarding with PT acting as patient, replicated gait deviations and random loss of balance as well as lean with stair training. Sister able to provide adequate guarding and support for gait, I do however recommend that they use 2 people for stair navigation at home until HHPT can really improve this. Left up in recliner with no further questions from patient or family. Plan for home with 24/7 assist at this point.    Follow Up Recommendations  Home health PT;Supervision/Assistance - 24 hour;Other (comment) (would benefit from PCA as well for increased assist at home for family)     Geophysical data processor (measurements PT);Wheelchair cushion (measurements PT);Other (comment);Rolling walker with 5" wheels;3in1 (PT) (already has RW and 3 in 1)    Recommendations for Other Services       Precautions / Restrictions Precautions Precautions: Fall Precaution Comments: Visual and auditory hallucinations, multidirectional and unpredictable balance  loss. Restrictions Weight Bearing Restrictions: No    Mobility  Bed Mobility               General bed mobility comments: up in recliner upon entry  Transfers Overall transfer level: Needs assistance Equipment used: 1 person hand held assist Transfers: Sit to/from Stand Sit to Stand: Min guard         General transfer comment: min guard for safety and balance, no physical assist given  Ambulation/Gait Ambulation/Gait assistance: Min assist Gait Distance (Feet): 150 Feet (68ftx2) Assistive device: 1 person hand held assist Gait Pattern/deviations: Step-through pattern;Decreased stride length;Scissoring;Narrow base of support Gait velocity: decreased   General Gait Details: slow and unsteady with gait, narrow BOS with frequent scissoring. Easily distracted by external environment. Still has multidirectional balance loss and difficulty with dual tasking   Stairs Stairs: Yes Stairs assistance: Mod assist Stair Management: One rail Right;Forwards Number of Stairs: 3 General stair comments: R ascending and descending rail, HHA on the other side. Heavy ModA due to posterior lean with stair training and needed heavy physical assist to maintain balance.   Wheelchair Mobility    Modified Rankin (Stroke Patients Only)       Balance Overall balance assessment: Needs assistance Sitting-balance support: Feet supported;No upper extremity supported Sitting balance-Leahy Scale: Good     Standing balance support: Single extremity supported;During functional activity Standing balance-Leahy Scale: Poor Standing balance comment: can be min guard in standing for static balance tasks, can need up to ModA for dynamic balance tasks                            Cognition Arousal/Alertness: Awake/alert Behavior During Therapy: [facility] for tasks assessed/performed Overall Cognitive  Status: Impaired/Different from baseline Area of Impairment:  Orientation;Attention;Memory;Following commands;Safety/judgement                 Orientation Level: Disoriented to;Time;Situation Current Attention Level: Sustained Memory: Decreased recall of precautions;Decreased short-term memory Following Commands: Follows one step commands consistently;Follows one step commands with increased time Safety/Judgement: Decreased awareness of safety;Decreased awareness of deficits Awareness: Intellectual Problem Solving: Requires verbal cues;Slow processing General Comments: can be a bit impulsive at times, but followed commands well today. No signs of hallucinations noted during today's session      Exercises      General Comments        Pertinent Vitals/Pain Pain Assessment: Faces Pain Score: 0-No pain Faces Pain Scale: No hurt Pain Intervention(s): Limited activity within patient's tolerance;Monitored during session    Home Living                      Prior Function            PT Goals (current goals can now be found in the care plan section) Acute Rehab PT Goals Patient Stated Goal: To sit in recliner. PT Goal Formulation: With patient Time For Goal Achievement: 04/17/20 Potential to Achieve Goals: Fair Progress towards PT goals: Progressing toward goals    Frequency    Min 3X/week      PT Plan Discharge plan needs to be updated;Frequency needs to be updated    Co-evaluation              AM-PAC PT "6 Clicks" Mobility   Outcome Measure  Help needed turning from your back to your side while in a flat bed without using bedrails?: A Little Help needed moving from lying on your back to sitting on the side of a flat bed without using bedrails?: A Little Help needed moving to and from a bed to a chair (including a wheelchair)?: A Little Help needed standing up from a chair using your arms (e.g., wheelchair or bedside chair)?: A Little Help needed to walk in hospital room?: A Lot Help needed climbing 3-5  steps with a railing? : A Lot 6 Click Score: 16    End of Session Equipment Utilized During Treatment: Gait belt Activity Tolerance: Patient tolerated treatment well Patient left: in chair;with call bell/phone within reach;with chair alarm set;with family/visitor present Nurse Communication: Mobility status PT Visit Diagnosis: Unsteadiness on feet (R26.81);History of falling (Z91.81);Other abnormalities of gait and mobility (R26.89)     Time: 5400-8676 PT Time Calculation (min) (ACUTE ONLY): 17 min  Charges:  $Gait Training: 8-22 mins                     Madelaine Etienne, DPT, PN1   Supplemental Physical Therapist [facility]    Pager [phone] Acute Rehab Office [phone]

## 2020-04-16 NOTE — TOC Transition Note (Signed)
Transition of Care [facility]) - CM/SW Discharge Note   Patient Details  Name: Jessica Campbell MRN: [id number] Date of Birth: Dec 26, 1955  Transition of Care [facility]) CM/SW Contact:  Bess Kinds, RN Phone Number: [phone] 04/16/2020, 9:17 AM   Clinical Narrative:     Spoke with Marylene Land, social worker at [facility], about patient transitioning home with her sister at [phone] [place], [place] [address]. Message sent to MD to inquire about time for readiness to transition in order to plan on transportation. Anticipate patient to be ready about 11:30 am. Marylene Land aware. [facility] to provide transportation. [facility] has arranged for [facility] services to begin tomorrow. DC summary to be faxed to [facility] in medical records at [facility] [phone]. TOC following for transition needs.   Final next level of care: Home w Home Health Services Barriers to Discharge: No Barriers Identified   Patient Goals and CMS Choice        Discharge Placement                       Discharge Plan and Services                DME Arranged: N/A DME Agency: NA       HH Arranged: NA HH Agency: NA        Social Determinants of Health (SDOH) Interventions     Readmission Risk Interventions No flowsheet data found.

## 2020-04-16 NOTE — Progress Notes (Signed)
   Subjective:   Patient evaluated this morning. She feels well, no acute complaints. Her sister who will be taking care of her, Jessica Campbell, is also present and they agreeable with plan for discharge.  Also called with [facility] regarding her current medications and status to facilitate handoff to them.  Objective:  Vital signs in last 24 hours: Vitals:   04/15/20 0604 04/15/20 0914 04/15/20 1646 04/15/20 2053  BP: (!) 149/64 113/71 119/63 139/89  Pulse: 67 68 68 73  Resp: 18 18 18 16   Temp: 98.5 F (36.9 C) 98.6 F (37 C) 97.8 F (36.6 C) 98.7 F (37.1 C)  TempSrc: Oral Oral  Oral  SpO2: 99% 99% 100% 97%  Weight:        Physical Exam Constitutional: Sitting comfortably in recliner Head: atraumatic ENT: external ears normal Eyes: EOMI Pulmonary: effort normal Abdominal: flat Skin: warm and dry Neurological:  Alert, oriented to person and time but not to place Psychiatric: continue to have visual and auditory hallucinations, denies SI or HI  Assessment/Plan:  Principal Problem:   Paranoid schizophrenia, chronic condition with acute exacerbation (HCC) Active Problems:   AKI (acute kidney injury) (HCC)   DM (diabetes mellitus), type 2 (HCC)   HTN (hypertension), benign   HLD (hyperlipidemia)   Acute kidney injury (HCC)   Hallucinations   Delirium   Anemia   Malnutrition of moderate degree  Jessica Campbell is a 65 y.o. female with hx of schizoaffective disorder with paranoia, HTN, HLD, DM, depression who presented with AKI which has improved and schizoaffective disorder.  Plan for discharge home with her sister with [facility] services MWF and [facility] services the remaining days, along with family support.  Schizoaffective disorder After stopping or reducing dose of many of her psychotropic medications, her tremors and mental status has improved. She is probably close to her baseline, with continued visual and auditory hallucinations which are not bothering her.  Denies SI or HI.  Consulted with psychiatry regarding discharge medications.  They affirmed that she does not need to be on antipsychotic at this time without command hallucinations, SI, or HI.  Complete resolution of hallucinations is not necessarily expected.  Recommend to continue current medications on discharge. -Continue Zoloft 50 mg -Ativan 1 milligram at nighttime as needed -Melatonin 3 mg and trazodone at night -no antipsychotic currently -Follow-up with Dr. [facility] outpatient with behavioral health on 05/02/2020  Drug-induced extrapyramidal movement disorder Mild kinetic tremor which has improved greatly since psych med changes. -Benztropine to 0.5mg  daily   Constipation -continue miralax BID -continue Senna BID -further enemas as needed, not Fleet's with CKD  Physical Deconditioning In the setting of prolonged hospitalization -Continue working with PT/OT    Prior to Admission Living Arrangement: Home Anticipated Discharge Location: Plan is to discharge to home with [facility] services, home health, family support, and outpatient Geri psych follow-up. She will be going to her sister's home with [facility] services MWF and home health the remaining days. Barriers to Discharge: Setting up [facility] services and home health  Dispo: Anticipated discharge is today. Patient is medically safe for discharge home once [facility] and [facility] services are set up.   [facility], MD 04/16/2020, 6:36 AM Pager: [phone] After 5pm on weekdays and 1pm on weekends: On Call pager [phone]

## 2020-04-16 NOTE — Progress Notes (Signed)
Jessica Campbell to be discharged Home with [facility] per MD order. Discussed prescriptions and follow up appointments with the patient. Prescriptions and medication list explained to family verbalized understanding.  Skin clean, dry and intact without evidence of skin break down, no evidence of skin tears noted. IV catheter discontinued intact. Site without signs and symptoms of complications. Dressing and pressure applied. Pt denies pain at the site currently. No complaints noted.  Patient free of lines, drains, and wounds.   An After Visit Summary (AVS) was printed and given to the patient. Patient escorted by [facility] staff via wheelchair and discharged.  Arvilla Meres, RN

## 2020-04-16 NOTE — Care Management Important Message (Signed)
Important Message  Patient Details  Name: Jessica Campbell MRN: [id number] Date of Birth: 01-Mar-1956   Medicare Important Message Given:  Yes     Ariea Rochin P Anner Baity 04/16/2020, 2:19 PM

## 2020-05-02 ENCOUNTER — Telehealth (HOSPITAL_COMMUNITY): Payer: Medicare (Managed Care) | Admitting: Psychiatry

## 2020-05-31 ENCOUNTER — Other Ambulatory Visit: Payer: Self-pay | Admitting: Internal Medicine

## 2020-05-31 DIAGNOSIS — E2839 Other primary ovarian failure: Secondary | ICD-10-CM

## 2020-06-05 ENCOUNTER — Other Ambulatory Visit: Payer: Medicare (Managed Care)

## 2021-11-30 IMAGING — MG DIGITAL SCREENING BILAT W/ TOMO W/ CAD
8 series · 8 of 24 positions shown · non-contrast
Comparison: Previous exam(s).

CLINICAL DATA: Screening.

EXAM:
DIGITAL SCREENING BILATERAL MAMMOGRAM WITH TOMO AND CAD

[R MLO synth-2D]
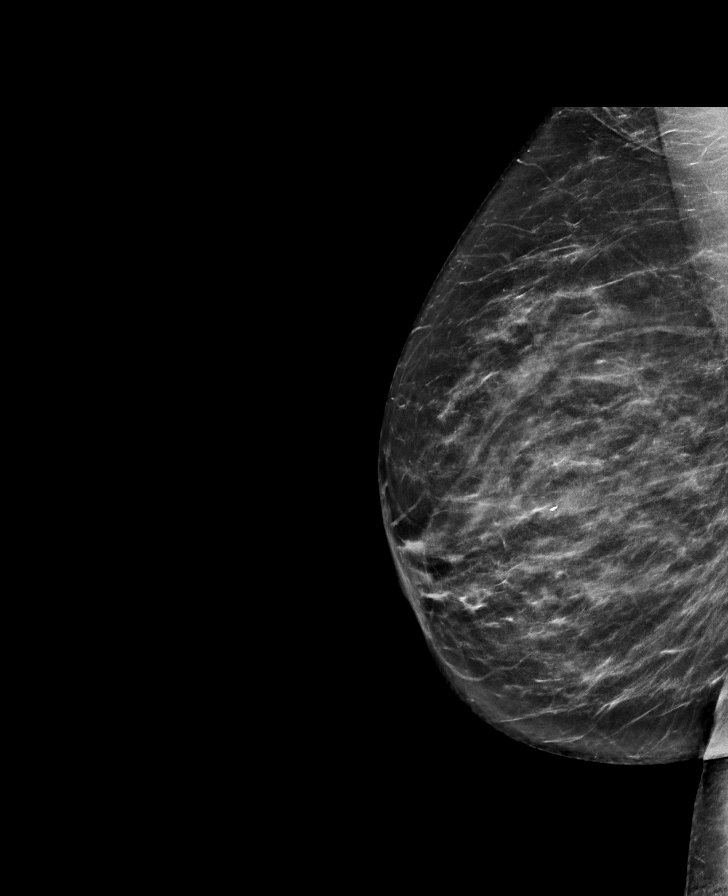

[L CC synth-2D]
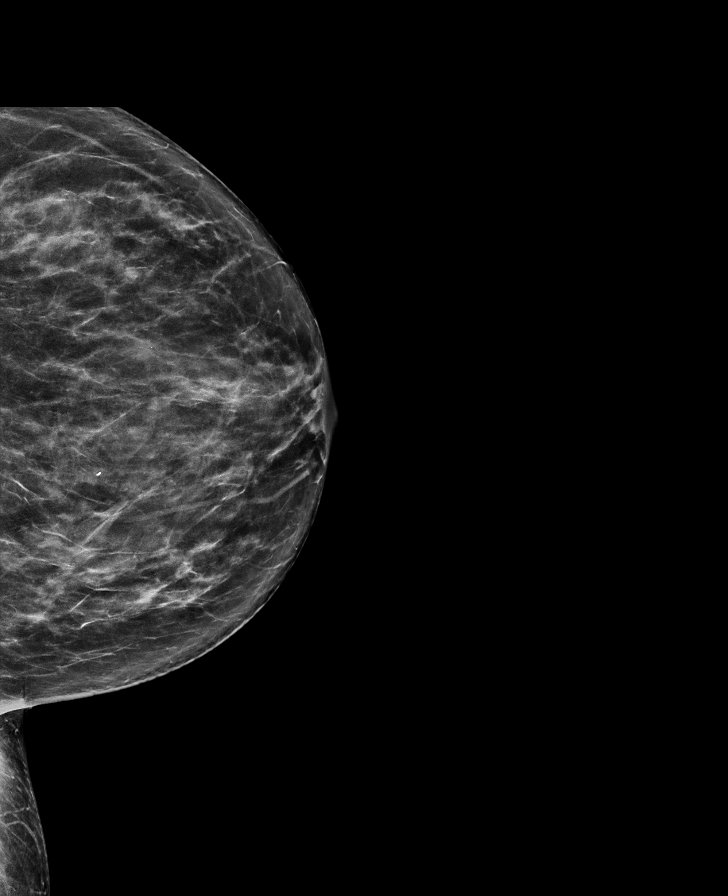

[R CC synth-2D]
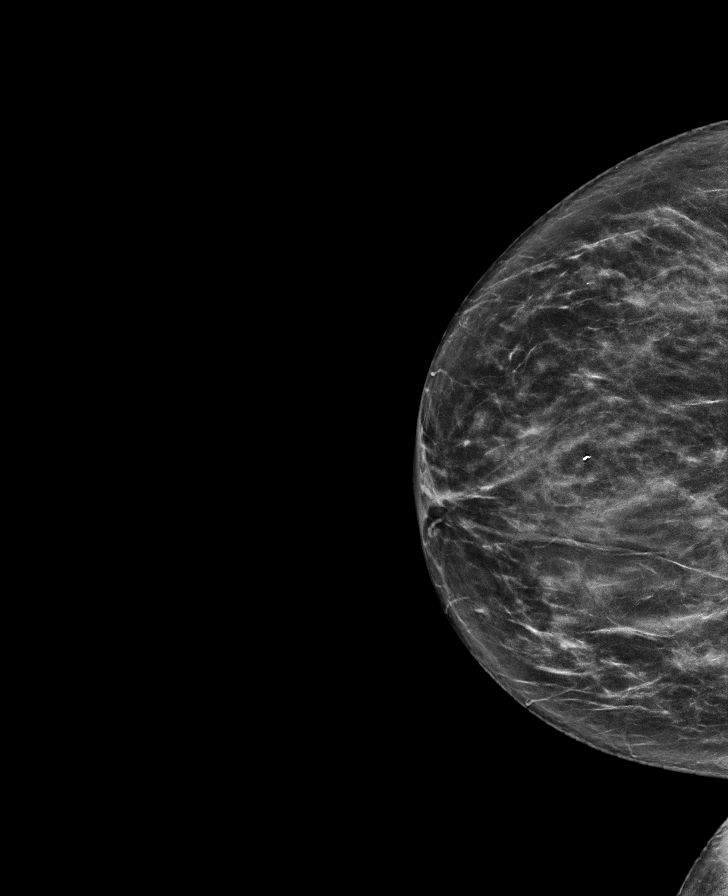

[L MLO synth-2D]
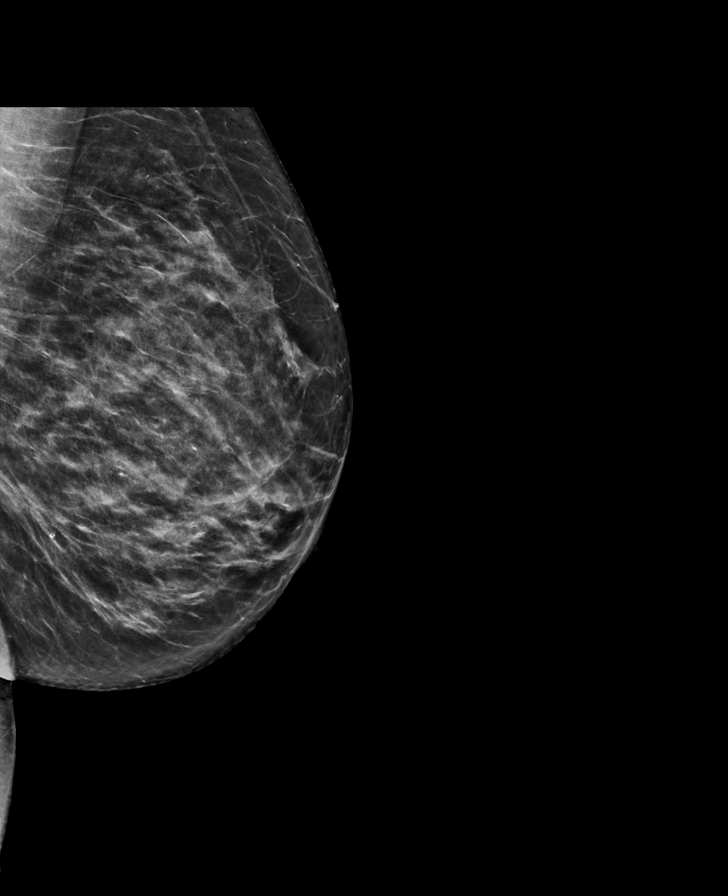

[L MLO tomo · tomo slice 36/71.0]
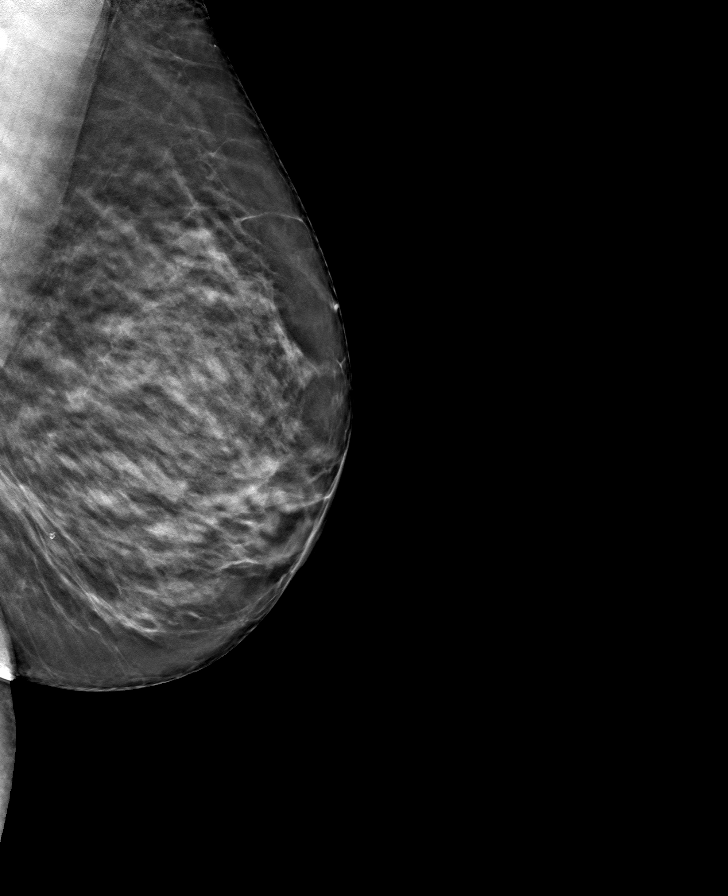

[R CC tomo · tomo slice 36/71.0]
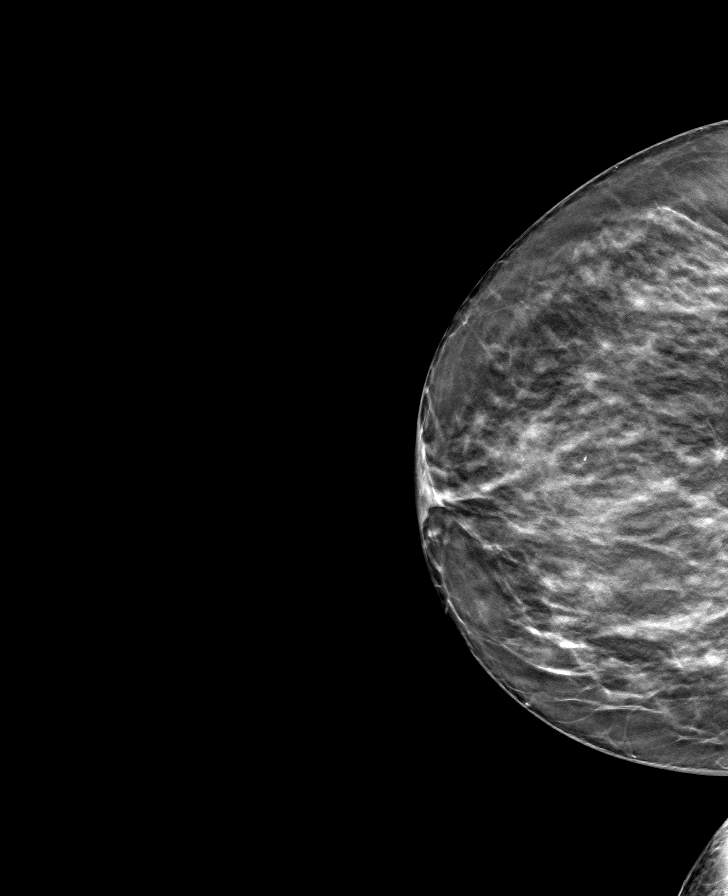

[L CC tomo · tomo slice 36/71.0]
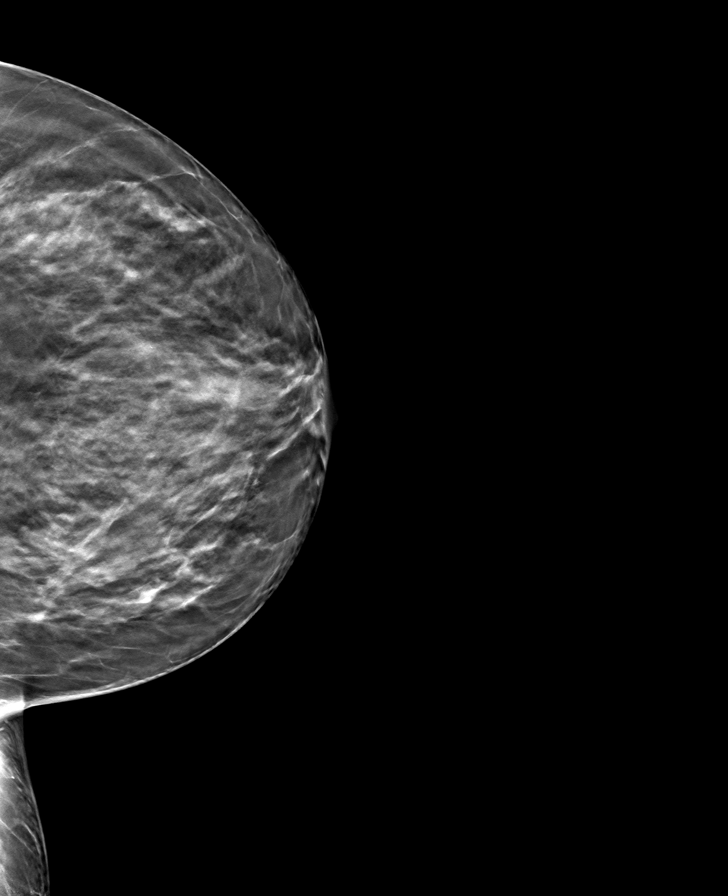

[R MLO tomo · tomo slice 38/75.0]
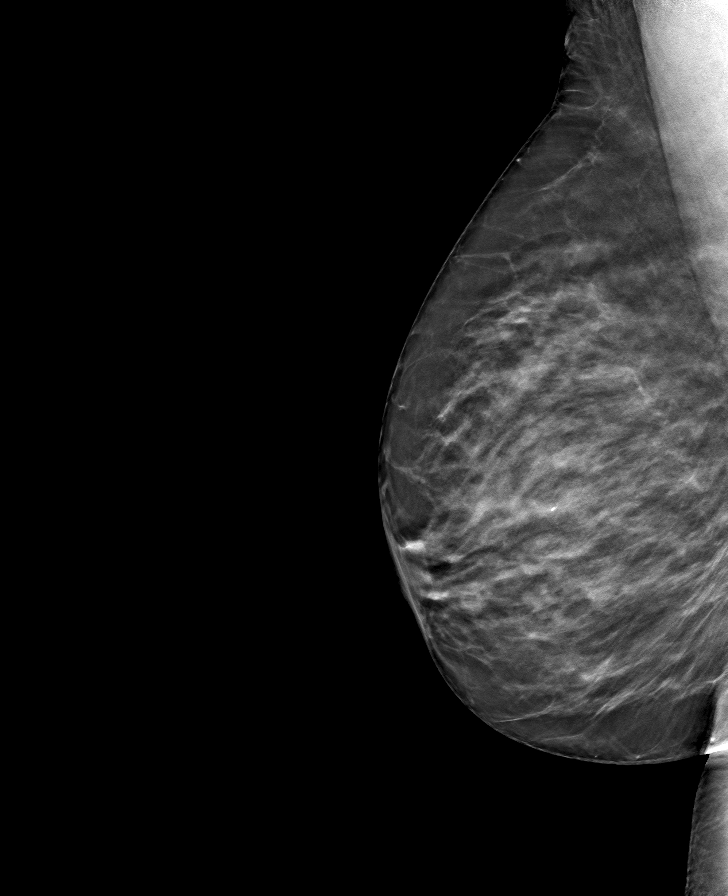

[8 of 24 positions shown; findings below may reference images not displayed]

ACR Breast Density Category c: The breast tissue is heterogeneously
dense, which may obscure small masses.
FINDINGS: There are no findings suspicious for malignancy. Images were
processed with CAD.
IMPRESSION: No mammographic evidence of malignancy. A result letter of this
screening mammogram will be mailed directly to the patient.

RECOMMENDATION:
Screening mammogram in one year. (Code:[id number]-U-LHB)

BI-RADS CATEGORY  1: Negative.

## 2022-02-08 IMAGING — US US RENAL
1 series · 14 of 25 positions shown · non-contrast
Comparison: 09/20/2019

CLINICAL DATA: Acute kidney injury

EXAM:
RENAL / URINARY TRACT ULTRASOUND COMPLETE

[Series 1: us renal · 14 of 38 slices shown]
[im 1/38]
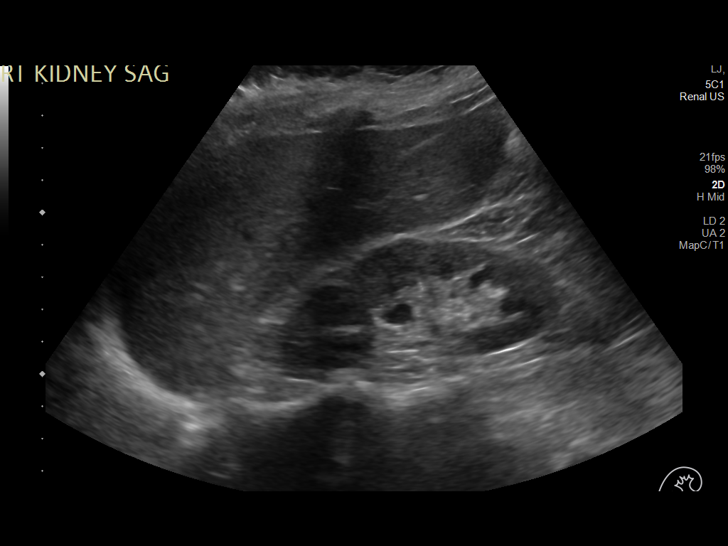
[im 4/38]
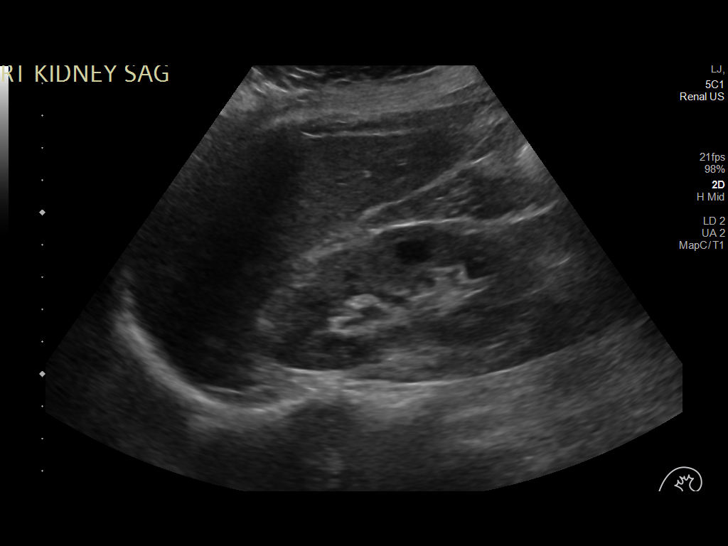
[im 7/38]
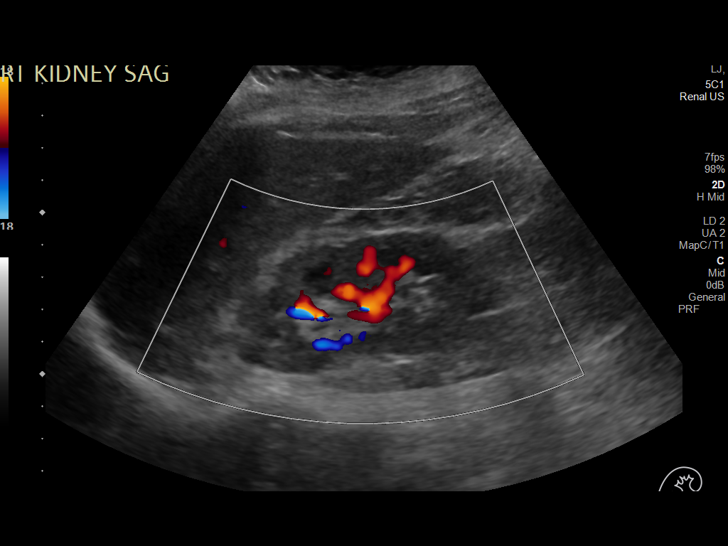
[im 10/38]
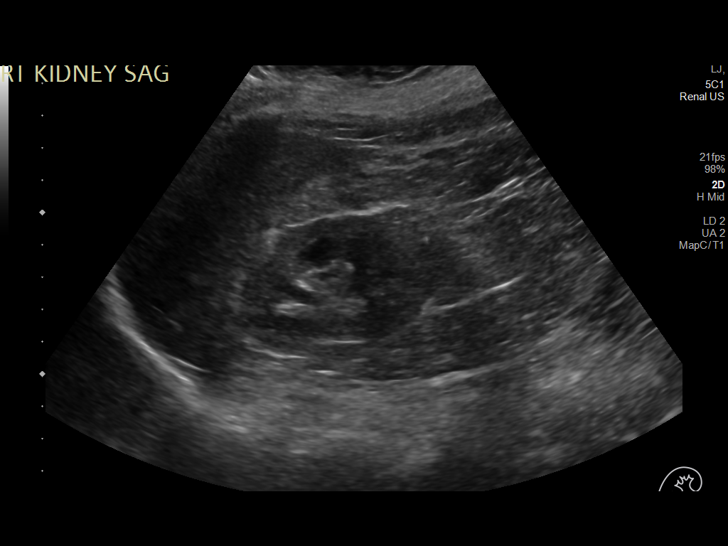
[im 13/38]
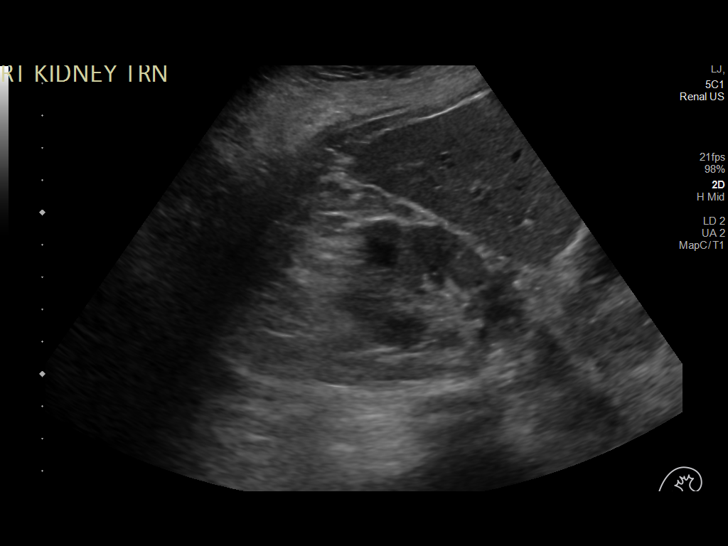
[im 14/38]
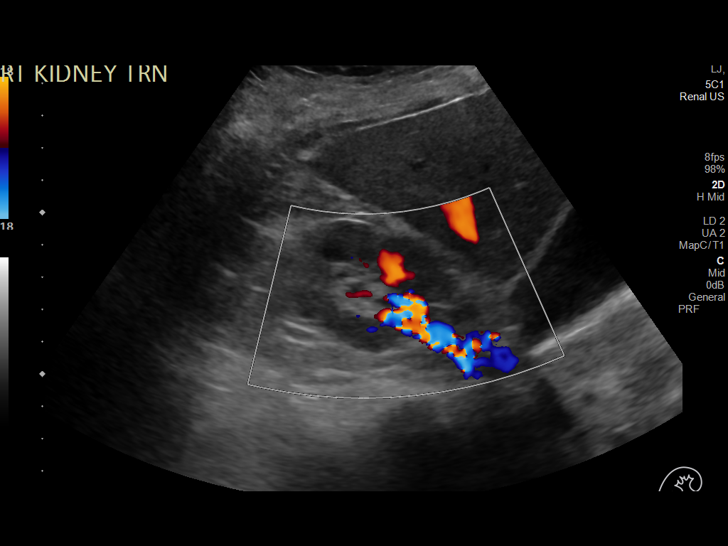
[im 17/38]
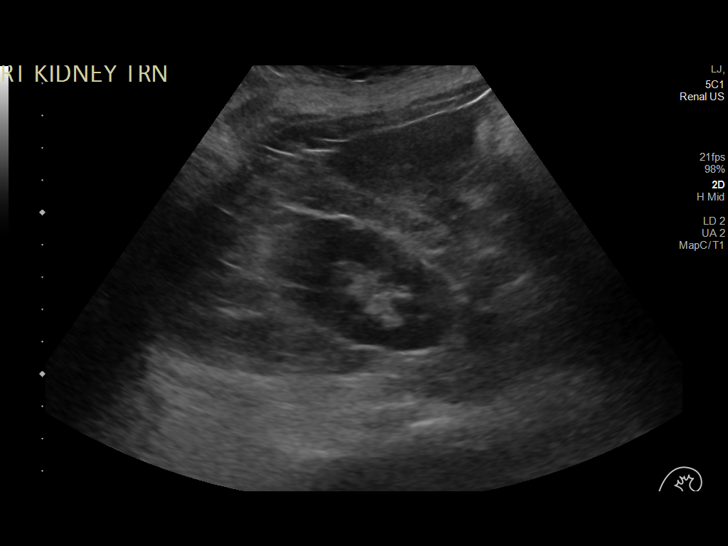
[im 21/38]
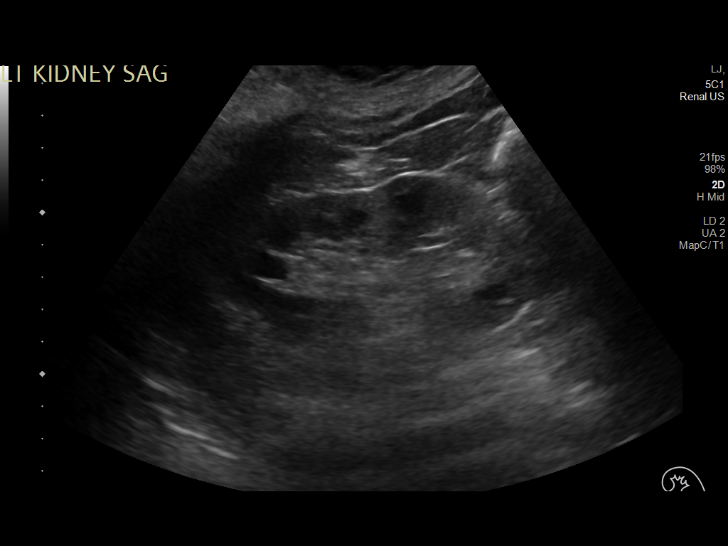
[im 24/38]
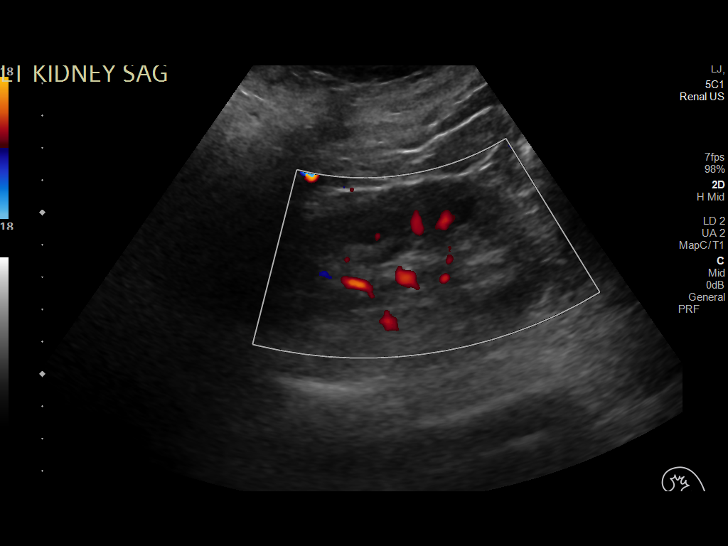
[im 25/38]
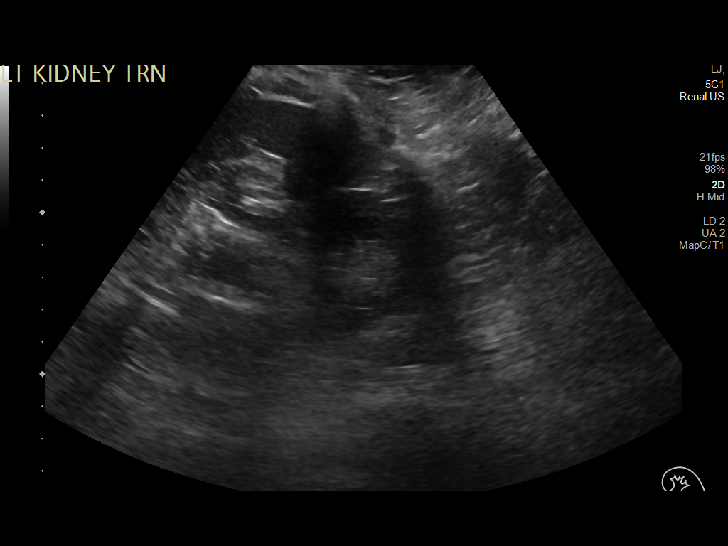
[im 28/38]
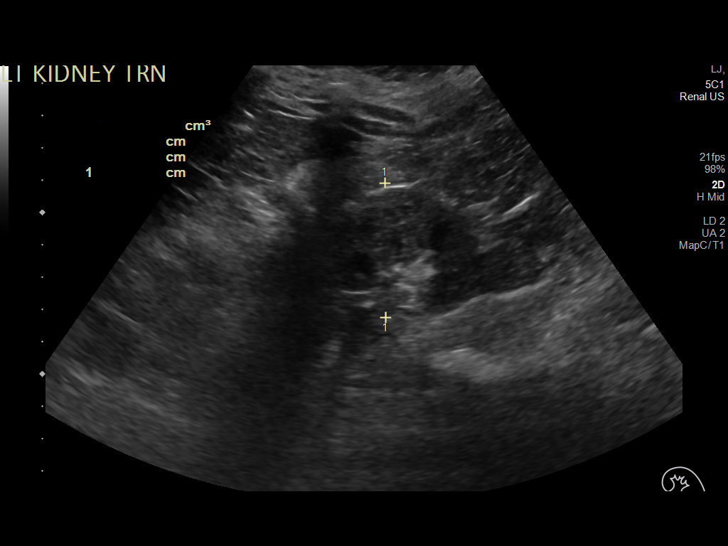
[im 31/38]
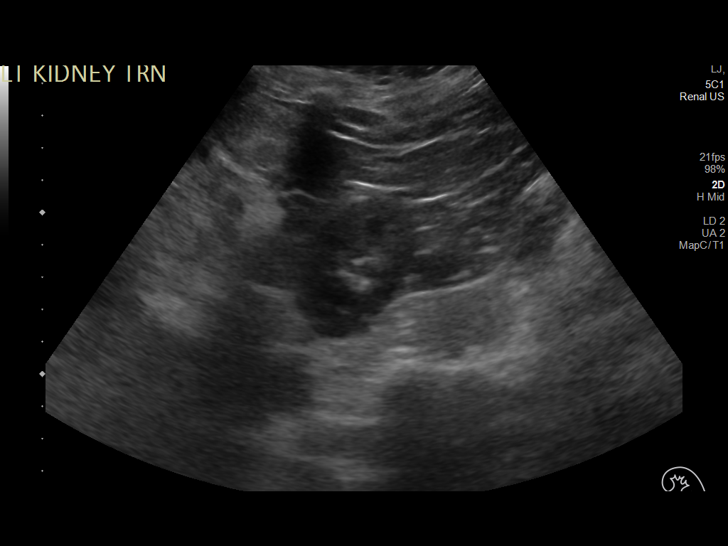
[im 34/38]
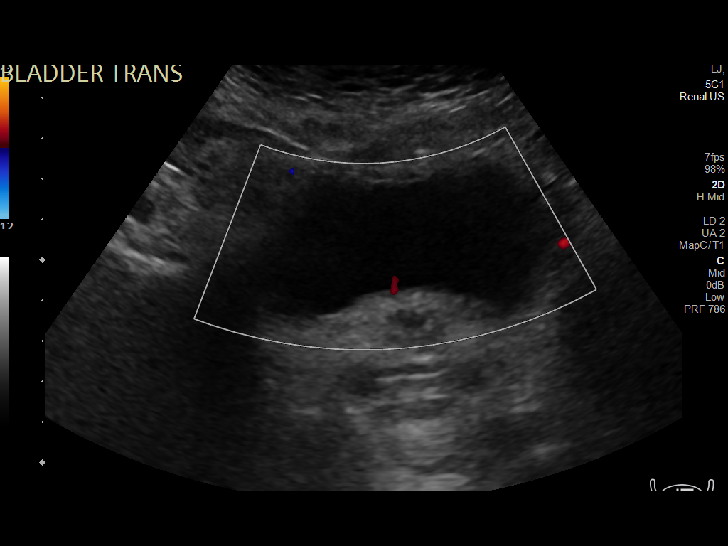
[im 38/38]
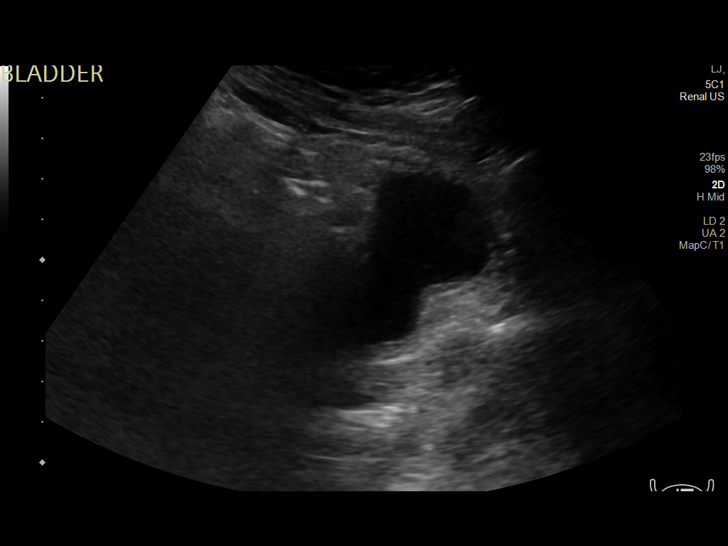

[14 of 25 positions shown; findings below may reference images not displayed]

FINDINGS: Right Kidney:

Renal measurements: 9.0 x 3.5 x 4.7 cm = volume: 78 mL. Mildly
increased renal cortical echogenicity. No mass or hydronephrosis
visualized.

Left Kidney:

Renal measurements: 9.5 x 4.9 x 4.2 cm = volume: 102 mL. Mildly
increased renal cortical echogenicity. No mass or hydronephrosis
visualized.

Bladder:

Appears normal for degree of bladder distention.

Other:

None.
IMPRESSION: 1. No evidence of obstructive uropathy.
2. Mildly increased renal cortical echogenicity bilaterally
suggesting medical renal disease.

## 2022-02-09 IMAGING — DX DG CHEST 1V PORT
1 series · 1 of 1 positions shown · non-contrast
Comparison: September 23, 2019

CLINICAL DATA: Cough and hypertension

EXAM:
PORTABLE CHEST 1 VIEW

[chest]
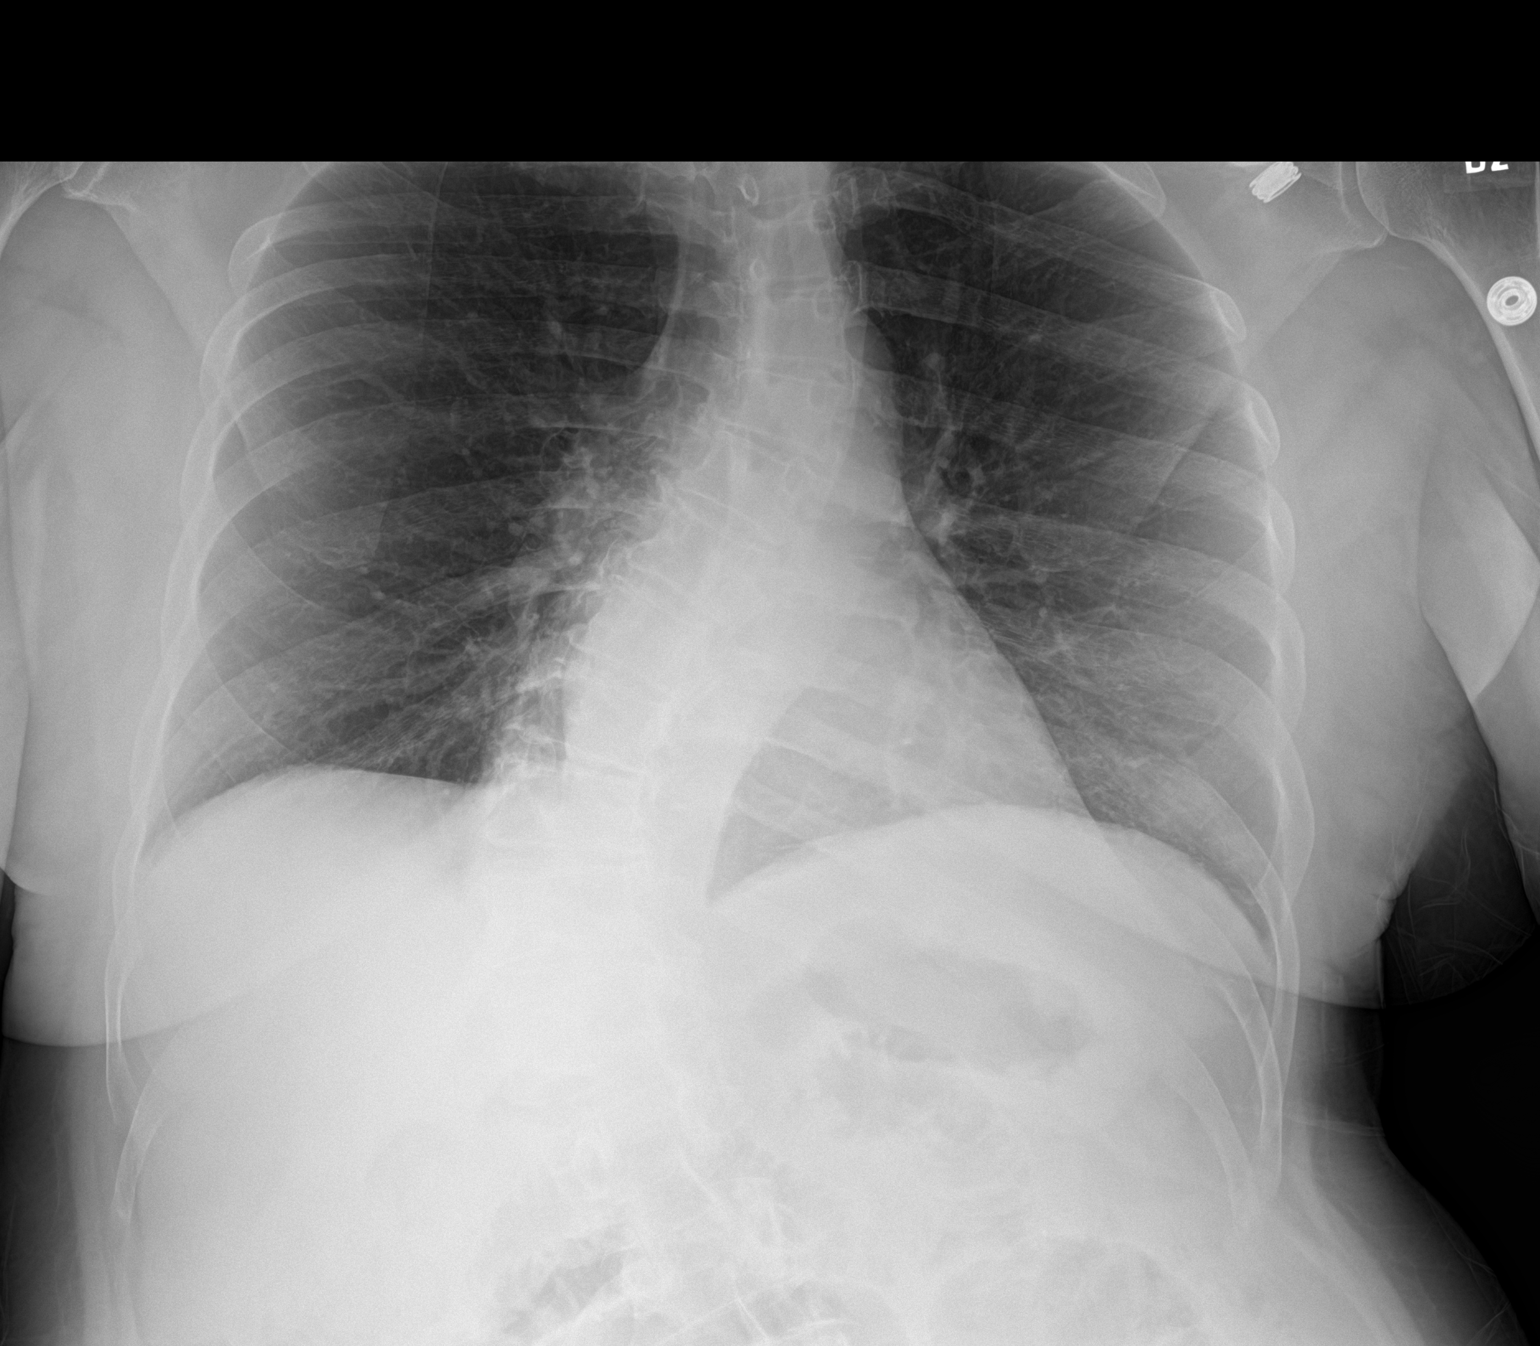

[1 of 1 positions shown; findings below may reference images not displayed]

FINDINGS: Lungs are clear. Heart size and pulmonary vascularity are normal. No
adenopathy. Scoliosis again noted.
IMPRESSION: Lungs clear.  Cardiac silhouette within normal limits.
